# Patient Record
Sex: Male | Born: 1960 | Race: White | Hispanic: No | Marital: Married | Smoking: Current every day smoker
Health system: Southern US, Community
[De-identification: ages and names within clinical notes are randomized; demographics above are authoritative.]

## PROBLEM LIST (undated history)

## (undated) DIAGNOSIS — K219 Gastro-esophageal reflux disease without esophagitis: Secondary | ICD-10-CM

## (undated) DIAGNOSIS — J3089 Other allergic rhinitis: Secondary | ICD-10-CM

## (undated) DIAGNOSIS — K649 Unspecified hemorrhoids: Secondary | ICD-10-CM

## (undated) DIAGNOSIS — F319 Bipolar disorder, unspecified: Secondary | ICD-10-CM

## (undated) DIAGNOSIS — K269 Duodenal ulcer, unspecified as acute or chronic, without hemorrhage or perforation: Secondary | ICD-10-CM

## (undated) DIAGNOSIS — F0390 Unspecified dementia without behavioral disturbance: Secondary | ICD-10-CM

## (undated) DIAGNOSIS — E785 Hyperlipidemia, unspecified: Secondary | ICD-10-CM

## (undated) DIAGNOSIS — J449 Chronic obstructive pulmonary disease, unspecified: Secondary | ICD-10-CM

## (undated) DIAGNOSIS — T542X1A Toxic effect of corrosive acids and acid-like substances, accidental (unintentional), initial encounter: Secondary | ICD-10-CM

## (undated) DIAGNOSIS — K5790 Diverticulosis of intestine, part unspecified, without perforation or abscess without bleeding: Secondary | ICD-10-CM

## (undated) DIAGNOSIS — K635 Polyp of colon: Secondary | ICD-10-CM

## (undated) DIAGNOSIS — D490 Neoplasm of unspecified behavior of digestive system: Secondary | ICD-10-CM

## (undated) DIAGNOSIS — T304 Corrosion of unspecified body region, unspecified degree: Secondary | ICD-10-CM

## (undated) DIAGNOSIS — B019 Varicella without complication: Secondary | ICD-10-CM

## (undated) HISTORY — PX: SMALL INTESTINE SURGERY: SHX150

## (undated) HISTORY — DX: Hyperlipidemia, unspecified: E78.5

## (undated) HISTORY — PX: WRIST FUSION: SHX839

## (undated) HISTORY — PX: SINUS EXPLORATION: SHX5214

## (undated) HISTORY — PX: NASAL SINUS SURGERY: SHX719

## (undated) HISTORY — DX: Polyp of colon: K63.5

## (undated) HISTORY — PX: SKIN GRAFT: SHX250

## (undated) HISTORY — PX: TONSILLECTOMY: SUR1361

## (undated) HISTORY — PX: APPENDECTOMY: SHX54

---

## 2004-12-08 ENCOUNTER — Emergency Department (HOSPITAL_COMMUNITY): Admission: EM | Admit: 2004-12-08 | Discharge: 2004-12-08 | Payer: Self-pay | Admitting: Emergency Medicine

## 2005-03-05 ENCOUNTER — Emergency Department: Payer: Self-pay | Admitting: Emergency Medicine

## 2005-08-29 ENCOUNTER — Ambulatory Visit: Payer: Self-pay | Admitting: Family Medicine

## 2005-10-22 ENCOUNTER — Emergency Department: Payer: Self-pay | Admitting: Emergency Medicine

## 2005-10-22 ENCOUNTER — Other Ambulatory Visit: Payer: Self-pay

## 2005-10-23 ENCOUNTER — Ambulatory Visit: Payer: Self-pay | Admitting: Emergency Medicine

## 2006-06-12 ENCOUNTER — Other Ambulatory Visit: Payer: Self-pay

## 2006-06-12 ENCOUNTER — Inpatient Hospital Stay: Payer: Self-pay | Admitting: Internal Medicine

## 2006-11-20 ENCOUNTER — Emergency Department: Payer: Self-pay | Admitting: Emergency Medicine

## 2006-12-28 ENCOUNTER — Ambulatory Visit: Payer: Self-pay | Admitting: Gastroenterology

## 2007-02-01 ENCOUNTER — Ambulatory Visit: Payer: Self-pay | Admitting: Gastroenterology

## 2007-06-10 ENCOUNTER — Emergency Department: Payer: Self-pay | Admitting: Emergency Medicine

## 2007-06-11 ENCOUNTER — Emergency Department: Payer: Self-pay | Admitting: Emergency Medicine

## 2007-09-28 ENCOUNTER — Emergency Department: Payer: Self-pay | Admitting: Emergency Medicine

## 2008-03-05 ENCOUNTER — Emergency Department: Payer: Self-pay | Admitting: Internal Medicine

## 2008-09-09 ENCOUNTER — Ambulatory Visit: Payer: Self-pay | Admitting: Gastroenterology

## 2009-06-25 ENCOUNTER — Emergency Department: Payer: Self-pay | Admitting: Emergency Medicine

## 2012-09-20 ENCOUNTER — Emergency Department: Payer: Self-pay | Admitting: Emergency Medicine

## 2013-05-04 ENCOUNTER — Emergency Department: Payer: Self-pay | Admitting: Emergency Medicine

## 2013-05-05 LAB — COMPREHENSIVE METABOLIC PANEL
Anion Gap: 3 — ABNORMAL LOW (ref 7–16)
BUN: 8 mg/dL (ref 7–18)
Co2: 30 mmol/L (ref 21–32)
EGFR (African American): >60
Osmolality: 269 (ref 275–301)
SGPT (ALT): 28 U/L (ref 12–78)

## 2013-05-05 LAB — URINALYSIS, COMPLETE
Bacteria: NONE SEEN
Glucose,UR: NEGATIVE mg/dL (ref 0–75)
Ph: 5 (ref 4.5–8.0)
Protein: NEGATIVE
RBC,UR: 17 /HPF (ref 0–5)
Squamous Epithelial: NONE SEEN
WBC UR: 1 /HPF (ref 0–5)

## 2013-05-05 LAB — CBC
HCT: 45.9 % (ref 40.0–52.0)
HGB: 16 g/dL (ref 13.0–18.0)
RDW: 13.3 % (ref 11.5–14.5)

## 2013-05-13 ENCOUNTER — Ambulatory Visit: Payer: Self-pay | Admitting: Gastroenterology

## 2013-05-23 ENCOUNTER — Ambulatory Visit: Payer: Self-pay | Admitting: Gastroenterology

## 2013-06-20 LAB — PATHOLOGY REPORT

## 2013-08-29 ENCOUNTER — Ambulatory Visit: Payer: Self-pay | Admitting: Gastroenterology

## 2013-09-02 LAB — PATHOLOGY REPORT

## 2013-09-26 ENCOUNTER — Ambulatory Visit: Payer: Self-pay | Admitting: Otolaryngology

## 2013-10-22 ENCOUNTER — Ambulatory Visit: Payer: Self-pay | Admitting: Otolaryngology

## 2013-10-30 ENCOUNTER — Ambulatory Visit: Payer: Self-pay | Admitting: Otolaryngology

## 2013-10-31 LAB — PATHOLOGY REPORT

## 2013-11-16 ENCOUNTER — Emergency Department: Payer: Self-pay | Admitting: Emergency Medicine

## 2014-10-22 ENCOUNTER — Other Ambulatory Visit: Payer: Medicare Other

## 2014-10-26 ENCOUNTER — Ambulatory Visit: Payer: Medicare Other | Admitting: Neurology

## 2014-11-07 NOTE — Op Note (Signed)
PATIENT NAME:  FINN, AMOS MR#:  [id number] DATE OF BIRTH:  09-Dec-1960  DATE OF PROCEDURE:  10/30/2013  PREOPERATIVE DIAGNOSES:  1. Chronic polypoid sinusitis.  2. 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  3. Uvular papilloma.   POSTOPERATIVE DIAGNOSES:  1. Chronic polypoid sinusitis.  2. 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  3. Uvular papilloma.   PROCEDURES:  1. Image guided sinus surgery (Stryker navigation).  2. Bilateral frontal sinusotomies with tissue removal.  3. Bilateral anterior and posterior ethmoidectomies with tissue removal.  4. Bilateral maxillary sinus antrostomies with tissue removal.  5. Septoplasty.  6. Bilateral inferior turbinate submucous resections.  7. Removal of uvular papilloma.   SURGEON:  Janalee Dane, MD  ANESTHESIA:  General endotracheal  DESCRIPTION OF PROCEDURE:  The patient was identified in the holding area and was brought back to the operating room in the supine position on the operating room table.  After general endotracheal anesthesia had been induced the patient was turned 90 degrees counter clockwise from anesthesia.  The nose was anesthetized with infraorbital nerve blocks and septal injection with 0.5% Lidocaine and 0.25% Bupivacaine mixed with 1:150,000 with Epinephrine and phenylephrine Lidocaine soaked pledgets, two on each side were placed and the face was prepped and draped in the usual fashion.  The image-guided sinus surgery system mask was attached and registration was carried out in the standard fashion using the appropriate fiduciary points. Calibration of the system was confirmed and extensive review of the CT scan in all three dimensions preoperatively and intraoperatively was carried out.  Each instrument was registered and confirmed for anatomic accuracy.  The pledgets were removed.  A 15 blade was used  to make a left-sided hemitransfixion incision and septal mucoperichondrial mucoperiosteal leaflets elevated.  There was a large inferior spur that was resected with Jansen-Middleton forceps.  The remaining septum was deviated back and forth in an accordion like fashion.  The bony cartilaginous junction was then divided and a moderate amount of vomer and perpendicular plate was taken down with Jansen-Middleton forceps, releasing the tension on the remaining septum.  The septum then swung back into the midline.  The septal leaflets were closed with quilting 4-0 chromic suture.  The left sided hemitransfixion incision was closed with 4-0 plain gut.  Attention was directed to the turbinates which had been previously injected on the left.  The head of the inferior turbinate on the left was incised with a 15 blade and the medial mucoperiosteum was elevated using a Psychologist, educational.  Once this had been elevated Knight scissors were used to resect the conchal bone and lateral mucoperiosteum.  The inferior margin of the remaining mucoperiosteum was then cauterized with suction cautery and Surgiflo was placed at the inferior to the inferior margin of the remaining inferior turbinate.  An identical procedure was performed on the right inferior turbinate with once again placement of Surgiflo along its inferior margin.    Attention was directed to the sinus surgery portion of the surgery. The left side was addressed first. The middle turbinate was gently medialized with s Valora Corporal.  The uncinate process was then identified and completely resected revealing the natural maxillary sinus ostium. The natural maxillary sinus ostium was progressively enlarged by removing tissue to create a large maxillary antrostomy, removing diseased tissue. The maxillary antrum was then irrigated copiously with saline to remove all of the purulence. Attention was directed to the ethmoid sinuses where  bone and mucosal tissue from the ethmoid sinus was  taken down from anterior to posterior preserving the skull base and lamina papyracea, removing diseased tissue. After the ethmoid sinuses had been completely cleaned out of polypoid chronically inflamed mucosa preserving the mucosa on the medial side of the middle turbinate along the skull base and along the lamina papyracea. The 45 degree scope was then used to explore the frontal recess. The frontal recess was progressively enlarged meticulously, removing diseased tissue but leaving the polypoid tissue directly lining the duct, and the frontal sinus was then copiously irrigated with saline. Attention was then directed to the right side where an identical series of procedures was accomplished. Upon completion of the sinus surgery, the Surgiflo was placed. A total of one unit of Surgiflo was used.  Temporary Telfa pledgets were placed and tied over the columella to prevent dislodging.    The Dingman mouth retractor was placed. The uvula was lightly graft with DeBakeys and the papilloma was removed with surgical scissors. After placement of local anesthesia, the wound was lightly cauterized with electrocautery and the oropharynx was irrigated. The patient was then returned to anesthesia, allowed to emerge from anesthesia in the operating room, and taken to the recovery room in stable condition. There were no complications.   ESTIMATED BLOOD LOSS: 20 mL.  ____________________________ J. Nadeen Landau, MD jmc:dd D: 10/30/2013 22:50:00 ET T: 10/31/2013 04:09:39 ET JOB#: [id number]  cc: Janalee Dane, MD, <Dictator> Nicholos Johns MD ELECTRONICALLY SIGNED 11/09/2013 11:11

## 2014-12-03 ENCOUNTER — Emergency Department: Payer: Medicare Other

## 2014-12-03 ENCOUNTER — Emergency Department
Admission: EM | Admit: 2014-12-03 | Discharge: 2014-12-03 | Disposition: A | Discharge status: Home / Self Care | Payer: Medicare Other | Attending: Emergency Medicine | Admitting: Emergency Medicine

## 2014-12-03 ENCOUNTER — Encounter: Payer: Self-pay | Admitting: Emergency Medicine

## 2014-12-03 DIAGNOSIS — Z87891 Personal history of nicotine dependence: Secondary | ICD-10-CM | POA: Insufficient documentation

## 2014-12-03 DIAGNOSIS — K2970 Gastritis, unspecified, without bleeding: Secondary | ICD-10-CM | POA: Diagnosis not present

## 2014-12-03 DIAGNOSIS — Z79899 Other long term (current) drug therapy: Secondary | ICD-10-CM | POA: Insufficient documentation

## 2014-12-03 DIAGNOSIS — R110 Nausea: Secondary | ICD-10-CM

## 2014-12-03 DIAGNOSIS — R079 Chest pain, unspecified: Secondary | ICD-10-CM | POA: Diagnosis not present

## 2014-12-03 DIAGNOSIS — R1010 Upper abdominal pain, unspecified: Secondary | ICD-10-CM | POA: Diagnosis present

## 2014-12-03 HISTORY — DX: Diverticulosis of intestine, part unspecified, without perforation or abscess without bleeding: K57.90

## 2014-12-03 HISTORY — DX: Chronic obstructive pulmonary disease, unspecified: J44.9

## 2014-12-03 HISTORY — DX: Duodenal ulcer, unspecified as acute or chronic, without hemorrhage or perforation: K26.9

## 2014-12-03 LAB — COMPREHENSIVE METABOLIC PANEL
ALBUMIN: 4.6 g/dL (ref 3.5–5.0)
ALT: 18 U/L (ref 17–63)
ANION GAP: 11 (ref 5–15)
AST: 23 U/L (ref 15–41)
Alkaline Phosphatase: 91 U/L (ref 38–126)
BILIRUBIN TOTAL: 0.8 mg/dL (ref 0.3–1.2)
BUN: 10 mg/dL (ref 6–20)
CALCIUM: 9.7 mg/dL (ref 8.9–10.3)
CO2: 23 mmol/L (ref 22–32)
CREATININE: 0.95 mg/dL (ref 0.61–1.24)
Chloride: 104 mmol/L (ref 101–111)
GFR calc Af Amer: >60 mL/min (ref >60)
Glucose, Bld: 104 mg/dL — ABNORMAL HIGH (ref 65–99)
Potassium: 4 mmol/L (ref 3.5–5.1)
Sodium: 138 mmol/L (ref 135–145)
Total Protein: 8.1 g/dL (ref 6.5–8.1)

## 2014-12-03 LAB — URINALYSIS COMPLETE WITH MICROSCOPIC (ARMC ONLY)
BACTERIA UA: NONE SEEN
Bilirubin Urine: NEGATIVE
Glucose, UA: NEGATIVE mg/dL
Nitrite: NEGATIVE
PH: 5 (ref 5.0–8.0)
Protein, ur: 30 mg/dL — AB
Specific Gravity, Urine: 1.024 (ref 1.005–1.030)

## 2014-12-03 LAB — CBC WITH DIFFERENTIAL/PLATELET
BASOS ABS: 0.1 10*3/uL (ref 0–0.1)
BASOS PCT: 1 %
EOS ABS: 0.2 10*3/uL (ref 0–0.7)
EOS PCT: 2 %
HCT: 45.3 % (ref 40.0–52.0)
Hemoglobin: 15.8 g/dL (ref 13.0–18.0)
Lymphocytes Relative: 17 %
Lymphs Abs: 2 10*3/uL (ref 1.0–3.6)
MCH: 32.6 pg (ref 26.0–34.0)
MCHC: 34.9 g/dL (ref 32.0–36.0)
MCV: 93.5 fL (ref 80.0–100.0)
Monocytes Absolute: 0.9 10*3/uL (ref 0.2–1.0)
Monocytes Relative: 8 %
NEUTROS PCT: 72 %
Neutro Abs: 8.5 10*3/uL — ABNORMAL HIGH (ref 1.4–6.5)
PLATELETS: 300 10*3/uL (ref 150–440)
RBC: 4.84 MIL/uL (ref 4.40–5.90)
RDW: 13.6 % (ref 11.5–14.5)
WBC: 11.6 10*3/uL — AB (ref 3.8–10.6)

## 2014-12-03 LAB — TROPONIN I: Troponin I: <0.03 ng/mL (ref <0.031)

## 2014-12-03 LAB — LIPASE, BLOOD: LIPASE: 62 U/L — AB (ref 22–51)

## 2014-12-03 MED ORDER — HYDROMORPHONE HCL 1 MG/ML IJ SOLN
1.0000 mg | Freq: Once | INTRAMUSCULAR | Status: AC
  Administered 2014-12-03: 1 mg via INTRAVENOUS

## 2014-12-03 MED ORDER — ONDANSETRON HCL 4 MG/2ML IJ SOLN
INTRAMUSCULAR | Status: AC
  Administered 2014-12-03: 4 mg via INTRAVENOUS
  Filled 2014-12-03: qty 2

## 2014-12-03 MED ORDER — HYDROMORPHONE HCL 1 MG/ML IJ SOLN
INTRAMUSCULAR | Status: AC
  Administered 2014-12-03: 1 mg via INTRAVENOUS
  Filled 2014-12-03: qty 1

## 2014-12-03 MED ORDER — MORPHINE SULFATE 4 MG/ML IJ SOLN
INTRAMUSCULAR | Status: AC
  Administered 2014-12-03: 4 mg via INTRAVENOUS
  Filled 2014-12-03: qty 1

## 2014-12-03 MED ORDER — ONDANSETRON HCL 4 MG/2ML IJ SOLN
4.0000 mg | Freq: Once | INTRAMUSCULAR | Status: AC
  Administered 2014-12-03: 4 mg via INTRAVENOUS

## 2014-12-03 MED ORDER — DIAZEPAM 5 MG/ML IJ SOLN
INTRAMUSCULAR | Status: AC
  Administered 2014-12-03: 2.5 mg via INTRAVENOUS
  Filled 2014-12-03: qty 2

## 2014-12-03 MED ORDER — FAMOTIDINE IN NACL 20-0.9 MG/50ML-% IV SOLN
INTRAVENOUS | Status: AC
  Administered 2014-12-03: 20 mg via INTRAVENOUS
  Filled 2014-12-03: qty 50

## 2014-12-03 MED ORDER — ONDANSETRON HCL 4 MG PO TABS: 4.0000 mg | ORAL_TABLET | Freq: Three times a day (TID) | ORAL | Status: DC | PRN

## 2014-12-03 MED ORDER — DICYCLOMINE HCL 20 MG PO TABS: 20.0000 mg | ORAL_TABLET | Freq: Four times a day (QID) | ORAL | Status: DC | PRN

## 2014-12-03 MED ORDER — FAMOTIDINE 20 MG PO TABS: 20.0000 mg | ORAL_TABLET | Freq: Two times a day (BID) | ORAL | Status: DC

## 2014-12-03 MED ORDER — FAMOTIDINE IN NACL 20-0.9 MG/50ML-% IV SOLN
20.0000 mg | Freq: Once | INTRAVENOUS | Status: AC
  Administered 2014-12-03: 20 mg via INTRAVENOUS

## 2014-12-03 MED ORDER — DIAZEPAM 5 MG/ML IJ SOLN
2.5000 mg | Freq: Once | INTRAMUSCULAR | Status: AC
  Administered 2014-12-03: 2.5 mg via INTRAVENOUS

## 2014-12-03 MED ORDER — MORPHINE SULFATE 4 MG/ML IJ SOLN
4.0000 mg | Freq: Once | INTRAMUSCULAR | Status: AC
  Administered 2014-12-03: 4 mg via INTRAVENOUS

## 2014-12-03 NOTE — Discharge Instructions (Signed)
1. Add famotidine 20 mg twice daily to your current medications. 2. Take medicines as needed for abdominal discomfort and nausea (Bentyl/Zofran #20). 3. Eat a bland diet daily. 4. Return to the ER for worsening symptoms, persistent vomiting, difficulty breathing or other concerns.  Abdominal Pain Many things can cause abdominal pain. Usually, abdominal pain is not caused by a disease and will improve without treatment. It can often be observed and treated at home. Your health care provider will do a physical exam and possibly order blood tests and X-rays to help determine the seriousness of your pain. However, in many cases, more time must pass before a clear cause of the pain can be found. Before that point, your health care provider may not know if you need more testing or further treatment. HOME CARE INSTRUCTIONS  Monitor your abdominal pain for any changes. The following actions may help to alleviate any discomfort you are experiencing:  Only take over-the-counter or prescription medicines as directed by your health care provider.  Do not take laxatives unless directed to do so by your health care provider.  Try a clear liquid diet (broth, tea, or water) as directed by your health care provider. Slowly move to a bland diet as tolerated. SEEK MEDICAL CARE IF:  You have unexplained abdominal pain.  You have abdominal pain associated with nausea or diarrhea.  You have pain when you urinate or have a bowel movement.  You experience abdominal pain that wakes you in the night.  You have abdominal pain that is worsened or improved by eating food.  You have abdominal pain that is worsened with eating fatty foods.  You have a fever. SEEK IMMEDIATE MEDICAL CARE IF:   Your pain does not go away within 2 hours.  You keep throwing up (vomiting).  Your pain is felt only in portions of the abdomen, such as the right side or the left lower portion of the abdomen.  You pass bloody or black  tarry stools. MAKE SURE YOU:  Understand these instructions.   Will watch your condition.   Will get help right away if you are not doing well or get worse.  Document Released: 04/12/2005 Document Revised: 07/08/2013 Document Reviewed: 03/12/2013 [facility] Patient Information 2015 [place], [place]. This information is not intended to replace advice given to you by your health care provider. Make sure you discuss any questions you have with your health care provider.  Gastritis, Adult Gastritis is soreness and swelling (inflammation) of the lining of the stomach. Gastritis can develop as a sudden onset (acute) or long-term (chronic) condition. If gastritis is not treated, it can lead to stomach bleeding and ulcers. CAUSES  Gastritis occurs when the stomach lining is weak or damaged. Digestive juices from the stomach then inflame the weakened stomach lining. The stomach lining may be weak or damaged due to viral or bacterial infections. One common bacterial infection is the Helicobacter pylori infection. Gastritis can also result from excessive alcohol consumption, taking certain medicines, or having too much acid in the stomach.  SYMPTOMS  In some cases, there are no symptoms. When symptoms are present, they may include:  Pain or a burning sensation in the upper abdomen.  Nausea.  Vomiting.  An uncomfortable feeling of fullness after eating. DIAGNOSIS  Your caregiver may suspect you have gastritis based on your symptoms and a physical exam. To determine the cause of your gastritis, your caregiver may perform the following:  Blood or stool tests to check for the H pylori  bacterium.  Gastroscopy. A thin, flexible tube (endoscope) is passed down the esophagus and into the stomach. The endoscope has a light and camera on the end. Your caregiver uses the endoscope to view the inside of the stomach.  Taking a tissue sample (biopsy) from the stomach to examine under a microscope. TREATMENT    Depending on the cause of your gastritis, medicines may be prescribed. If you have a bacterial infection, such as an H pylori infection, antibiotics may be given. If your gastritis is caused by too much acid in the stomach, H2 blockers or antacids may be given. Your caregiver may recommend that you stop taking aspirin, ibuprofen, or other nonsteroidal anti-inflammatory drugs (NSAIDs). HOME CARE INSTRUCTIONS  Only take over-the-counter or prescription medicines as directed by your caregiver.  If you were given antibiotic medicines, take them as directed. Finish them even if you start to feel better.  Drink enough fluids to keep your urine clear or pale yellow.  Avoid foods and drinks that make your symptoms worse, such as:  Caffeine or alcoholic drinks.  Chocolate.  Peppermint or mint flavorings.  Garlic and onions.  Spicy foods.  Citrus fruits, such as oranges, lemons, or limes.  Tomato-based foods such as sauce, chili, salsa, and pizza.  Fried and fatty foods.  Eat small, frequent meals instead of large meals. SEEK IMMEDIATE MEDICAL CARE IF:   You have black or dark red stools.  You vomit blood or material that looks like coffee grounds.  You are unable to keep fluids down.  Your abdominal pain gets worse.  You have a fever.  You do not feel better after 1 week.  You have any other questions or concerns. MAKE SURE YOU:  Understand these instructions.  Will watch your condition.  Will get help right away if you are not doing well or get worse. Document Released: 06/27/2001 Document Revised: 01/02/2012 Document Reviewed: 08/16/2011 [facility] Patient Information 2015 [employer], [place]. This information is not intended to replace advice given to you by your health care provider. Make sure you discuss any questions you have with your health care provider.

## 2014-12-03 NOTE — ED Notes (Signed)

## 2014-12-03 NOTE — ED Notes (Signed)
Patient ambulatory to triage with steady gait, without difficulty or distress noted; pt reports having  x3 days having mid abd pain, described as "burning and stabbing" radiating into midsternum accomp by nausea

## 2014-12-03 NOTE — ED Provider Notes (Signed)
[facility] Emergency Department Provider Note  ____________________________________________  Time seen: Approximately 0350 AM  I have reviewed the triage vital signs and the nursing notes.   HISTORY  Chief Complaint Abdominal Pain    HPI Ian Newman. is a 54 y.o. male who presents with a three-day history of upper abdominal pain. Patient describes 10/10 burning and stabbing pain to epigastrium and upper quadrants radiating into his chest. States pain has been constant and progressive; patient has been unable to rest. Patient endorses nausea. Denies fever, chills, shortness of breath, vomiting, diarrhea, dysuria, testicular pain, numbness, tingling, weakness. States he has been taking his Carafate and acid reducers without relief. Nothing makes the pain better or worse.Patient admits he went on a "drastic" change in diet approximately 3 weeks ago, cutting out all fats, "peanut butter and mayonnaise", bacon in an effort to reduce his triglycerides.   Past Medical History  Diagnosis Date  . Diverticulosis   . Duodenal ulcer   . Bipolar affective   . COPD (chronic obstructive pulmonary disease)     There are no active problems to display for this patient.   Past Surgical History  Procedure Laterality Date  . Skin graft    . Wrist fusion      right  . Small intestine surgery      tumor removed  . Nasal sinus surgery      Current Outpatient Rx  Name  Route  Sig  Dispense  Refill  . dicyclomine (BENTYL) 20 MG tablet   Oral   Take 1 tablet (20 mg total) by mouth every 6 (six) hours as needed.   20 tablet   0   . famotidine (PEPCID) 20 MG tablet   Oral   Take 1 tablet (20 mg total) by mouth 2 (two) times daily.   60 tablet   0   . ondansetron (ZOFRAN) 4 MG tablet   Oral   Take 1 tablet (4 mg total) by mouth every 8 (eight) hours as needed for nausea or vomiting.   20 tablet   1     Allergies Review of patient's allergies indicates no  known allergies.  No family history on file.  Social History History  Substance Use Topics  . Smoking status: Former Research scientist (life sciences)  . Smokeless tobacco: Not on file  . Alcohol Use: Yes     Comment: occasional    Review of Systems Constitutional: No fever/chills Eyes: No visual changes. ENT: No sore throat. Cardiovascular: Positive for chest pain radiating from upper abdomen. Respiratory: Denies shortness of breath. Gastrointestinal: Positive for abdominal pain.  Positive for nausea. No vomiting.  No diarrhea.  No constipation. Genitourinary: Negative for dysuria. Musculoskeletal: Negative for back pain. Skin: Negative for rash. Neurological: Negative for headaches, focal weakness or numbness.  10-point ROS otherwise negative.  ____________________________________________   PHYSICAL EXAM:  VITAL SIGNS: ED Triage Vitals  Enc Vitals Group     BP 12/03/14 0307 216/128 mmHg     Pulse Rate 12/03/14 0307 88     Resp 12/03/14 0330 11     Temp 12/03/14 0307 97.9 F (36.6 C)     Temp Source 12/03/14 0307 Oral     SpO2 12/03/14 0307 100 %     Weight --      Height 12/03/14 0307 6' (1.829 m)     Head Cir --      Peak Flow --      Pain Score 12/03/14 0306 9  Pain Loc --      Pain Edu? --      Excl. in [place]? --     Constitutional: Alert and oriented. Well appearing and in moderate acute distress. Eyes: Conjunctivae are normal. PERRL. EOMI. Head: Atraumatic. Nose: No congestion/rhinnorhea. Mouth/Throat: Mucous membranes are moist.  Oropharynx non-erythematous. Neck: No stridor.   Cardiovascular: Normal rate, regular rhythm. Grossly normal heart sounds.  Good peripheral circulation. Respiratory: Normal respiratory effort.  No retractions. Lungs CTAB. Gastrointestinal: Soft, tender to palpation upper abdomen without rebound or guarding. No distention. No abdominal bruits. No CVA tenderness. Musculoskeletal: No lower extremity tenderness nor edema.  No joint  effusions. Neurologic:  Normal speech and language. No gross focal neurologic deficits are appreciated. Speech is normal. No gait instability. Skin:  Skin is warm, dry and intact. No rash noted. Psychiatric: Mood and affect are normal. Speech and behavior are normal.  ____________________________________________   LABS (all labs ordered are listed, but only abnormal results are displayed)  Labs Reviewed  CBC WITH DIFFERENTIAL/PLATELET - Abnormal; Notable for the following:    WBC 11.6 (*)    Neutro Abs 8.5 (*)    All other components within normal limits  COMPREHENSIVE METABOLIC PANEL - Abnormal; Notable for the following:    Glucose, Bld 104 (*)    All other components within normal limits  LIPASE, BLOOD - Abnormal; Notable for the following:    Lipase 62 (*)    All other components within normal limits  URINALYSIS COMPLETEWITH MICROSCOPIC ([facility])  - Abnormal; Notable for the following:    Color, Urine YELLOW (*)    APPearance CLEAR (*)    Ketones, ur 1+ (*)    Hgb urine dipstick 2+ (*)    Protein, ur 30 (*)    Leukocytes, UA 1+ (*)    Squamous Epithelial / LPF 0-5 (*)    All other components within normal limits  TROPONIN I   ____________________________________________  EKG  ED ECG REPORT   Date: 12/03/2014  EKG Time: 0317   Rate: 82  Rhythm: normal EKG, normal sinus rhythm  Axis: Normal  Intervals:none  ST&T Change: Nonspecific  ____________________________________________  RADIOLOGY  Portable chest x-ray (viewed by me, interpreted by Dr. Marisue Humble): No acute pulmonary process.  Ultrasound abdomen complete interpreted by Dr. Dorann Lodge: Nonvisualized pancreas and aorta, likely obscured by bowel gas.  No acute abdominal process by routine sonography. ____________________________________________   PROCEDURES  Procedure(s) performed: None  Critical Care performed: No  ____________________________________________   INITIAL IMPRESSION / ASSESSMENT AND  PLAN / ED COURSE  Pertinent labs & imaging results that were available during my care of the patient were reviewed by me and considered in my medical decision making (see chart for details).  53 year old male presenting with upper abdominal pain 3 days, history of peptic ulcer disease. IV fluid resuscitation, analgesia and antiemetic, proceed with abdominal ultrasound to evaluate biliary tree and also aorta. Will obtain upright chest x-ray to evaluate for free air.  ----------------------------------------- 5:30 AM on 12/03/2014 -----------------------------------------  Patient resting more comfortably. States initially had great relief after analgesic, pain now returning. Will try IV Valium. Patient asking for something to drink.  ----------------------------------------- 6:14 AM on 12/03/2014 -----------------------------------------  Patient improved. Will add famotidine in addition to patient's Carafate and Protonix. Strict return precautions given. Patient verbalizes understanding and agrees with plan of care. ____________________________________________   FINAL CLINICAL IMPRESSION(S) / ED DIAGNOSES  Final diagnoses:  Pain of upper abdomen  Nausea  Gastritis  Paulette Blanch, MD 12/04/14 [phone]

## 2014-12-03 NOTE — ED Notes (Signed)
Sung,MD consulted. MD made aware of persistent pain s/p Morphine. MD with [facility] for: Zofran 4mg  IV, Dilaudid 1mg  IV, Pepcid 20mg  IV and a one view chest. Orders to be entered and carried by this RN.

## 2014-12-03 NOTE — ED Notes (Signed)
Patient returned to the room from [place]. Pending results at this time. Pain at an acceptable level at this time. Will continue to monitor.

## 2014-12-07 DIAGNOSIS — K2100 Gastro-esophageal reflux disease with esophagitis, without bleeding: Secondary | ICD-10-CM | POA: Insufficient documentation

## 2014-12-15 ENCOUNTER — Encounter: Payer: Self-pay | Admitting: Cardiology

## 2014-12-15 ENCOUNTER — Encounter
Admission: RE | Disposition: A | Discharge status: Home / Self Care | Payer: Self-pay | Source: Ambulatory Visit | Attending: Gastroenterology

## 2014-12-15 ENCOUNTER — Ambulatory Visit
Admission: RE | Admit: 2014-12-15 | Discharge: 2014-12-15 | Disposition: A | Discharge status: Home / Self Care | Payer: Medicare Other | Source: Ambulatory Visit | Attending: Gastroenterology | Admitting: Gastroenterology

## 2014-12-15 ENCOUNTER — Ambulatory Visit: Payer: Medicare Other | Admitting: Anesthesiology

## 2014-12-15 ENCOUNTER — Other Ambulatory Visit: Payer: Self-pay | Admitting: Cardiology

## 2014-12-15 ENCOUNTER — Encounter: Payer: Self-pay | Admitting: Anesthesiology

## 2014-12-15 DIAGNOSIS — Z79899 Other long term (current) drug therapy: Secondary | ICD-10-CM | POA: Diagnosis not present

## 2014-12-15 DIAGNOSIS — R569 Unspecified convulsions: Secondary | ICD-10-CM | POA: Insufficient documentation

## 2014-12-15 DIAGNOSIS — J449 Chronic obstructive pulmonary disease, unspecified: Secondary | ICD-10-CM | POA: Insufficient documentation

## 2014-12-15 DIAGNOSIS — R1013 Epigastric pain: Secondary | ICD-10-CM | POA: Diagnosis present

## 2014-12-15 DIAGNOSIS — Z538 Procedure and treatment not carried out for other reasons: Secondary | ICD-10-CM | POA: Diagnosis not present

## 2014-12-15 DIAGNOSIS — R1011 Right upper quadrant pain: Secondary | ICD-10-CM | POA: Diagnosis not present

## 2014-12-15 DIAGNOSIS — Z888 Allergy status to other drugs, medicaments and biological substances status: Secondary | ICD-10-CM | POA: Diagnosis not present

## 2014-12-15 DIAGNOSIS — F319 Bipolar disorder, unspecified: Secondary | ICD-10-CM | POA: Diagnosis not present

## 2014-12-15 HISTORY — PX: ESOPHAGOGASTRODUODENOSCOPY: SHX5428

## 2014-12-15 SURGERY — EGD (ESOPHAGOGASTRODUODENOSCOPY)
Anesthesia: General

## 2014-12-15 MED ORDER — GLYCOPYRROLATE 0.2 MG/ML IJ SOLN: INTRAMUSCULAR | Status: DC | PRN

## 2014-12-15 MED ORDER — SODIUM CHLORIDE 0.9 % IV SOLN
INTRAVENOUS | Status: DC
  Administered 2014-12-15: 13:00:00 via INTRAVENOUS
  Administered 2014-12-15: 1000 mL via INTRAVENOUS

## 2014-12-15 MED ORDER — SODIUM CHLORIDE 0.9 % IV SOLN: INTRAVENOUS | Status: DC

## 2014-12-15 MED ORDER — FENTANYL CITRATE (PF) 100 MCG/2ML IJ SOLN: 25.0000 ug | INTRAMUSCULAR | Status: DC | PRN

## 2014-12-15 MED ORDER — PROPOFOL 10 MG/ML IV BOLUS
INTRAVENOUS | Status: DC | PRN
  Administered 2014-12-15: 70 mg via INTRAVENOUS

## 2014-12-15 MED ORDER — ONDANSETRON HCL 4 MG/2ML IJ SOLN: 4.0000 mg | Freq: Once | INTRAMUSCULAR | Status: DC | PRN

## 2014-12-15 NOTE — Transfer of Care (Signed)
Immediate Anesthesia Transfer of Care Note  Patient: Ian Newman.  Procedure(s) Performed: Procedure(s): ESOPHAGOGASTRODUODENOSCOPY (EGD) (N/A)  Patient Location: PACU and Endoscopy Unit  Anesthesia Type:General  Level of Consciousness: awake, alert  and oriented  Airway & Oxygen Therapy: Patient Spontanous Breathing and Patient connected to nasal cannula oxygen  Post-op Assessment: Report given to RN and Post -op Vital signs reviewed and stable  Post vital signs: Reviewed and stable  Last Vitals:  Filed Vitals:   12/15/14 1246  BP: 135/81  Pulse: 76  Temp: 36.4 C  Resp: 16    Complications: No apparent anesthesia complications

## 2014-12-15 NOTE — Progress Notes (Signed)
[place]    Cardiology Consultation Note  Patient ID: Ian Newman., MRN: [id number], DOB/AGE: [id number] y.o. Admit date: (Not on file)   Date of Consult: 12/15/2014 Primary Physician: Volanda Napoleon, MD Primary Cardiologist:  None  Chief Complaint:  No complaints Reason for Consult:  Abnormal EKG on telemetry during induction of anesthesia  HPI: 54 y.o. male with h/o  Patient is a 54 year old male with no prior cardiac history who was being evaluated with an upper endoscopy for right upper quadrant abdominal pain which is fairly constant.  He has no exertional component to this.  Is worse with deep palpation.  He denies chest pain otherwise orthopnea PND.  EKG 12 lead done after returning to holding area showed normal sinus rhythm with no ischemia.  EKG on telemetry during induction showed a apparent QRS with some ST changes.  This resolved after discontinuing probable fall.  Patient is currently hemodynamics is stable as no chest pain or shortness of breath.  Past Medical History  Diagnosis Date  . Diverticulosis   . Duodenal ulcer   . Bipolar affective   . COPD (chronic obstructive pulmonary disease)       Most Recent Cardiac Studies:   EKG:  Sinus rhythm with no ischemia   Surgical History:  Past Surgical History  Procedure Laterality Date  . Skin graft    . Wrist fusion      right  . Small intestine surgery      tumor removed  . Nasal sinus surgery       Home Meds: Prior to Admission medications   Medication Sig Start Date End Date Taking? Authorizing Provider  atorvastatin (LIPITOR) 10 MG tablet Take 1 tablet by mouth daily. 11/02/14   Historical Provider, MD  dicyclomine (BENTYL) 20 MG tablet Take 1 tablet (20 mg total) by mouth every 6 (six) hours as needed. Patient taking differently: Take 20 mg by mouth every 6 (six) hours as needed for spasms.  12/03/14   Paulette Blanch, MD  famotidine (PEPCID) 20 MG tablet  Take 1 tablet (20 mg total) by mouth 2 (two) times daily. 12/03/14   Paulette Blanch, MD  fluticasone (FLONASE) 50 MCG/ACT nasal spray Place 1 spray into both nostrils daily. 09/03/14   Historical Provider, MD  ondansetron (ZOFRAN) 4 MG tablet Take 1 tablet (4 mg total) by mouth every 8 (eight) hours as needed for nausea or vomiting. 12/03/14   Paulette Blanch, MD  pantoprazole (PROTONIX) 40 MG tablet Take 1 tablet by mouth daily. 06/10/14   Historical Provider, MD  sucralfate (CARAFATE) 1 G tablet Take 1 tablet by mouth 2 (two) times daily.    Historical Provider, MD    Inpatient Medications:     Allergies:  Allergies  Allergen Reactions  . Pepcid [Famotidine] Itching    History   Social History  . Marital Status: Married    Spouse Name: N/A  . Number of Children: N/A  . Years of Education: N/A   Occupational History  . Not on file.   Social History Main Topics  . Smoking status: Former Research scientist (life sciences)  . Smokeless tobacco: Not on file  . Alcohol Use: Yes     Comment: occasional  . Drug Use: Not on file  . Sexual Activity: Not on file   Other Topics Concern  . Not on file   Social History Narrative     No family history on  file.   Review of Systems: General: negative for chills, fever, night sweats or weight changes.  Cardiovascular: negative for chest pain, edema, orthopnea, palpitations, paroxysmal nocturnal dyspnea, shortness of breath or dyspnea on exertion Dermatological: negative for rash Respiratory: negative for cough or wheezing Urologic: negative for hematuria Abdominal: negative for nausea, vomiting, diarrhea, bright red blood per rectum, melena, or hematemesis right upper quadrant abdominal pain Neurologic: negative for visual changes, syncope, or dizziness All other systems reviewed and are otherwise negative except as noted above.  Labs: No results for input(s): CKTOTAL, CKMB, TROPONINI in the last 72 hours. Lab Results  Component Value Date   WBC 11.6* 12/03/2014    HGB 15.8 12/03/2014   HCT 45.3 12/03/2014   MCV 93.5 12/03/2014   PLT 300 12/03/2014   No results for input(s): NA, K, CL, CO2, BUN, CREATININE, CALCIUM, PROT, BILITOT, ALKPHOS, ALT, AST, GLUCOSE in the last 168 hours.  Invalid input(s): LABALBU No results found for: CHOL, HDL, LDLCALC, TRIG No results found for: DDIMER  Radiology/Studies:  US Abdomen Complete  12/03/2014   CLINICAL DATA:  RIGHT upper quadrant pain for 3-4 days, assess aorta. History of diverticulosis.  EXAM: ULTRASOUND ABDOMEN COMPLETE  COMPARISON:  RIGHT upper quadrant ultrasound May 05, 2013 and HIDA scan May 23, 2013  FINDINGS: Gallbladder: No gallstones or wall thickening visualized. No sonographic Murphy sign noted.  Common bile duct: Diameter: 5 mm  Liver: No focal lesion identified. Within normal limits in parenchymal echogenicity. Hepatopetal portal vein.  IVC: No abnormality visualized.  Pancreas: Predominately obscured, likely by bowel gas.  Spleen: Size and appearance within normal limits.  Right Kidney: Length: 10.8 cm. Echogenicity within normal limits. No mass or hydronephrosis visualized.  Left Kidney: Length: 11.8 cm. Echogenicity within normal limits. No mass or hydronephrosis visualized.  Abdominal aorta: Predominately obscured, likely by bowel gas.  Other findings: None.  IMPRESSION: Nonvisualized pancreas and aorta, likely obscured by bowel gas.  No acute abdominal process by routine sonography.   Electronically Signed   By: Elon Alas   On: 12/03/2014 05:15   Dg Chest Portable 1 View  12/03/2014   CLINICAL DATA:  Chest pressure. Upper abdominal pain. Symptoms for 3 days.  EXAM: PORTABLE CHEST - 1 VIEW  COMPARISON:  05/05/2013  FINDINGS: The cardiomediastinal contours are normal. The lungs are clear. Pulmonary vasculature is normal. No consolidation, pleural effusion, or pneumothorax. No acute osseous abnormalities are seen.  IMPRESSION: No acute pulmonary process.   Electronically Signed   By:  Jeb Levering M.D.   On: 12/03/2014 04:31    EKG:  Normal sinus rhythm with no ischemia  Weights: There were no vitals filed for this visit.   Physical Exam: There were no vitals taken for this visit. There is no weight on file to calculate BMI. General: Well developed, well nourished, in no acute distress. Head: Normocephalic, atraumatic, sclera non-icteric, no xanthomas, nares are without discharge.  Neck: Negative for carotid bruits. JVD not elevated. Lungs: Clear bilaterally to auscultation without wheezes, rales, or rhonchi. Breathing is unlabored. Heart: RRR with S1 S2. No murmurs, rubs, or gallops appreciated. Abdomen: Soft, non-tender, non-distended with normoactive bowel sounds. No hepatomegaly. No rebound/guarding. No obvious abdominal masses. Msk:  Strength and tone appear normal for age. Extremities: No clubbing or cyanosis. No edema.  Distal pedal pulses are 2+ and equal bilaterally. Neuro: Alert and oriented X 3. No facial asymmetry. No focal deficit. Moves all extremities spontaneously. Psych:  Responds to questions appropriately with a normal  affect.    Assessment and Plan:  Patient with right upper quadrant abdominal pain with an abnormal transient rhythm strip on telemetry during indction  Of moderate sedation.  Procedure canceled.  Twelve lead EKG post conduction was normal.  To risk stratify patient prior to further attempts at upper endoscopy.  Would discharge home with follow-up with an outpatient this week for further recommendations.  Signed, Javier Docker Takia Runyon MD [facility]   12/15/2014, 3:30 PM

## 2014-12-15 NOTE — OR Nursing (Signed)
Patient procedure canceled due to cardiac arrhythmias, elevated ST segment, 12 lead EKG ordered, patient did receive some sedation and will go to GI recovery.

## 2014-12-15 NOTE — Anesthesia Preprocedure Evaluation (Signed)
Anesthesia Evaluation  Patient identified by MRN, date of birth, ID band Patient awake    Reviewed: Allergy & Precautions, NPO status , Patient's Chart, lab work & pertinent test results  Airway Mallampati: III  TM Distance: >3 FB Neck ROM: Full   Comment: Large neck Dental  (+) Chipped, Upper Dentures   Pulmonary COPD COPD inhaler, former smoker,  breath sounds clear to auscultation  Pulmonary exam normal       Cardiovascular Normal cardiovascular examRhythm:Regular Rate:Normal     Neuro/Psych Bipolar Disorder    GI/Hepatic Neg liver ROS, PUD, GERD-  Medicated and Controlled,Hx of Barrett's esophagus   Endo/Other  negative endocrine ROS  Renal/GU negative Renal ROS     Musculoskeletal negative musculoskeletal ROS (+)   Abdominal (+) + obese,   Peds  Hematology negative hematology ROS (+)   Anesthesia Other Findings   Reproductive/Obstetrics negative OB ROS                             Anesthesia Physical Anesthesia Plan  ASA: III  Anesthesia Plan: General   Post-op Pain Management:    Induction: Intravenous  Airway Management Planned: Nasal Cannula  Additional Equipment:   Intra-op Plan:   Post-operative Plan:   Informed Consent: I have reviewed the patients History and Physical, chart, labs and discussed the procedure including the risks, benefits and alternatives for the proposed anesthesia with the patient or authorized representative who has indicated his/her understanding and acceptance.   Dental advisory given  Plan Discussed with: CRNA and Surgeon  Anesthesia Plan Comments:         Anesthesia Quick Evaluation

## 2014-12-15 NOTE — H&P (Addendum)
Outpatient short stay form Pre-procedure 12/15/2014 1:20 PM Ian Sails MD  Primary Physician: Dr. Brunetta Genera  Reason for visit:  EGD  History of present illness:  Patient is a 54 year old male who presents today for an EGD in regards to recent increase of symptoms of right upper quadrant pain in the setting of possible history of erosive gastritis and duodenitis. In taking a proton pump inhibitor while some Carafate may be helping some he continues to have a right upper quadrant pain that is daily area is been no melanoma or hematochezia. He is some nausea. He has had a evaluation in the emergency room about with ultrasound and laboratories the showing a slight elevation of lipase at 62. Abdominal ultrasound was negative for gallstones biliary ductal dilatation or other changes.    Current facility-administered medications:  .  0.9 %  sodium chloride infusion, , Intravenous, Continuous, Ian Sails, MD, Last Rate: 10 mL/hr at 12/15/14 1258, 1,000 mL at 12/15/14 1258 .  0.9 %  sodium chloride infusion, , Intravenous, Continuous, Ian Sails, MD  Prescriptions prior to admission  Medication Sig Dispense Refill Last Dose  . dicyclomine (BENTYL) 20 MG tablet Take 1 tablet (20 mg total) by mouth every 6 (six) hours as needed. (Patient taking differently: Take 20 mg by mouth every 6 (six) hours as needed for spasms. ) 20 tablet 0 12/14/2014  . fluticasone (FLONASE) 50 MCG/ACT nasal spray Place 1 spray into both nostrils daily.   12/14/2014  . ondansetron (ZOFRAN) 4 MG tablet Take 1 tablet (4 mg total) by mouth every 8 (eight) hours as needed for nausea or vomiting. 20 tablet 1 12/14/2014  . pantoprazole (PROTONIX) 40 MG tablet Take 1 tablet by mouth daily.   12/14/2014  . sucralfate (CARAFATE) 1 G tablet Take 1 tablet by mouth 2 (two) times daily.   12/14/2014  . atorvastatin (LIPITOR) 10 MG tablet Take 1 tablet by mouth daily.   12/14/2014  . famotidine (PEPCID) 20 MG tablet Take 1  tablet (20 mg total) by mouth 2 (two) times daily. 60 tablet 0      Allergies  Allergen Reactions  . Pepcid [Famotidine] Itching     Past Medical History  Diagnosis Date  . Diverticulosis   . Duodenal ulcer   . Bipolar affective   . COPD (chronic obstructive pulmonary disease)     Review of systems:      Physical Exam    Heart and lungs: Regular rate and rhythm without rub or gallop the lungs are bilaterally clear    HEENT: Normocephalic atraumatic eyes are anicteric    Other:     Pertinant exam for procedure: Soft tender to palpation in the epigastric region extending toward the right upper quadrant. As follows along the lower edge of the costal margin. There are no masses or rebound. I'll sounds positive normoactive    Planned proceedures: EGD and indicated procedures. I have discussed the risks benefits and complications of procedures to include not limited to bleeding, infection, perforation and the risk of sedation and the patient wishes to proceed.    Ian Sails, MD Gastroenterology 12/15/2014  1:20 PM     Addendum: After positioning patient for his procedure to getting seizure noted that he develop an atypical configuration of his ekg apparent ST elevation. Scope is not introduced. The sedation was halted. The apparent ST changes resolved.  Consultation was obtained from Dr Ubaldo Glassing.  Plans for further evaluation as outpatient noted.

## 2014-12-15 NOTE — Op Note (Signed)
[facility] Gastroenterology Patient Name: Christoph Copelan Procedure Date: 12/15/2014 1:28 PM MRN: [id number] Account #: [id number] Date of Birth: Feb 13, 1961 Admit Type: Outpatient Age: 54 Room: [facility] ENDO ROOM 3 Gender: Male Note Status: Finalized Procedure:         Upper GI endoscopy Indications:       Epigastric abdominal pain, Abdominal pain in the right                     upper quadrant Providers:         Lollie Sails, MD Referring MD:      Venetia Maxon. Elijio Miles, MD (Referring MD) Medicines:         Monitored Anesthesia Care Complications:     No immediate complications. Procedure:         Pre-Anesthesia Assessment:                    - ASA Grade Assessment: II - A patient with mild systemic                     disease.                    After obtaining informed consent, the endoscope was passed                     under direct vision. Throughout the procedure, the                     patient's blood pressure, pulse, and oxygen saturations                     were monitored continuously. The procedure was aborted.                     The scope was not inserted. Medications were given. The                     procedure was aborted due to ECG changes on sedation. Findings:      aborted due to ECG changes on sedation Impression:        - The procedure was aborted due to ECG changes on sedation.                    - No specimens collected. Recommendation:    - Refer to a cardiologist today. Diagnosis Code(s): --- Professional ---                    789.06, Abdominal pain, epigastric                    789.01, Abdominal pain, right upper quadrant                    V64.3, Procedure not carried out for other reasons Lollie Sails, MD 12/15/2014 3:34:04 PM This report has been signed electronically. Number of Addenda: 0 Note Initiated On: 12/15/2014 1:28 PM      [facility]

## 2014-12-15 NOTE — Anesthesia Postprocedure Evaluation (Signed)
  Anesthesia Post-op Note  Patient: Ian Newman.  Procedure(s) Performed: Procedure(s): ESOPHAGOGASTRODUODENOSCOPY (EGD) (N/A)  Anesthesia type:General  Patient location: PACU  Post pain: Pain level controlled  Post assessment: Post-op Vital signs reviewed, Patient's Cardiovascular Status Stable, Respiratory Function Stable, Patent Airway and No signs of Nausea or vomiting  Post vital signs: Reviewed and stable  Last Vitals:  Filed Vitals:   12/15/14 1420  BP: 145/81  Pulse: 66  Temp:   Resp: 15    Level of consciousness: awake, alert  and patient cooperative  Complications: Patient had intraop ST segment changes right after propofol was given that was a possible elevation or widening of the complex in a 3 lead system , which resolved  Intra op. Nevertheless, Dr. Gustavo Lah wanted to cancel the case for evaluation of possible EKG changes, which I felt was reasonable. The   12 lead EKG was normal in the PACU and the 3lead system was changed to a 5 lead System with normal looking complex.  Dr. Ubaldo Glassing from cardiology is present to evaluate the patient.

## 2014-12-17 ENCOUNTER — Encounter: Payer: Self-pay | Admitting: Gastroenterology

## 2015-01-04 ENCOUNTER — Encounter: Payer: Self-pay | Admitting: *Deleted

## 2015-01-05 ENCOUNTER — Encounter: Admission: RE | Payer: Self-pay | Source: Ambulatory Visit

## 2015-01-05 ENCOUNTER — Ambulatory Visit: Admission: RE | Admit: 2015-01-05 | Payer: Self-pay | Source: Ambulatory Visit | Admitting: Gastroenterology

## 2015-01-05 ENCOUNTER — Ambulatory Visit
Admission: RE | Admit: 2015-01-05 | Discharge: 2015-01-05 | Disposition: A | Discharge status: Home / Self Care | Payer: Medicare Other | Source: Ambulatory Visit | Attending: Gastroenterology | Admitting: Gastroenterology

## 2015-01-05 ENCOUNTER — Encounter
Admission: RE | Disposition: A | Discharge status: Home / Self Care | Payer: Self-pay | Source: Ambulatory Visit | Attending: Gastroenterology

## 2015-01-05 ENCOUNTER — Ambulatory Visit: Payer: Medicare Other | Admitting: Anesthesiology

## 2015-01-05 DIAGNOSIS — Z888 Allergy status to other drugs, medicaments and biological substances status: Secondary | ICD-10-CM | POA: Diagnosis not present

## 2015-01-05 DIAGNOSIS — K319 Disease of stomach and duodenum, unspecified: Secondary | ICD-10-CM | POA: Insufficient documentation

## 2015-01-05 DIAGNOSIS — F319 Bipolar disorder, unspecified: Secondary | ICD-10-CM | POA: Diagnosis not present

## 2015-01-05 DIAGNOSIS — R1011 Right upper quadrant pain: Secondary | ICD-10-CM | POA: Diagnosis present

## 2015-01-05 DIAGNOSIS — J449 Chronic obstructive pulmonary disease, unspecified: Secondary | ICD-10-CM | POA: Diagnosis not present

## 2015-01-05 DIAGNOSIS — K219 Gastro-esophageal reflux disease without esophagitis: Secondary | ICD-10-CM | POA: Diagnosis not present

## 2015-01-05 DIAGNOSIS — Z79899 Other long term (current) drug therapy: Secondary | ICD-10-CM | POA: Diagnosis not present

## 2015-01-05 HISTORY — DX: Varicella without complication: B01.9

## 2015-01-05 HISTORY — DX: Unspecified hemorrhoids: K64.9

## 2015-01-05 HISTORY — PX: ESOPHAGOGASTRODUODENOSCOPY: SHX5428

## 2015-01-05 HISTORY — DX: Bipolar disorder, unspecified: F31.9

## 2015-01-05 HISTORY — DX: Gastro-esophageal reflux disease without esophagitis: K21.9

## 2015-01-05 SURGERY — EGD (ESOPHAGOGASTRODUODENOSCOPY)
Anesthesia: General

## 2015-01-05 MED ORDER — PROPOFOL 10 MG/ML IV BOLUS
INTRAVENOUS | Status: DC | PRN
  Administered 2015-01-05: 50 mg via INTRAVENOUS

## 2015-01-05 MED ORDER — SODIUM CHLORIDE 0.9 % IV SOLN
INTRAVENOUS | Status: DC
  Administered 2015-01-05: 1000 mL via INTRAVENOUS

## 2015-01-05 MED ORDER — MIDAZOLAM HCL 2 MG/2ML IJ SOLN
INTRAMUSCULAR | Status: DC | PRN
  Administered 2015-01-05: 1 mg via INTRAVENOUS

## 2015-01-05 MED ORDER — PROPOFOL INFUSION 10 MG/ML OPTIME
INTRAVENOUS | Status: DC | PRN
  Administered 2015-01-05: 100 ug/kg/min via INTRAVENOUS

## 2015-01-05 MED ORDER — GLYCOPYRROLATE 0.2 MG/ML IJ SOLN
INTRAMUSCULAR | Status: DC | PRN
  Administered 2015-01-05: 0.2 mg via INTRAVENOUS

## 2015-01-05 MED ORDER — SODIUM CHLORIDE 0.9 % IV SOLN: INTRAVENOUS | Status: DC

## 2015-01-05 MED ORDER — FENTANYL CITRATE (PF) 100 MCG/2ML IJ SOLN
INTRAMUSCULAR | Status: DC | PRN
  Administered 2015-01-05: 50 ug via INTRAVENOUS

## 2015-01-05 NOTE — Anesthesia Preprocedure Evaluation (Signed)
Anesthesia Evaluation  Patient identified by MRN, date of birth, ID band Patient awake    Reviewed: Allergy & Precautions, NPO status , Patient's Chart, lab work & pertinent test results  History of Anesthesia Complications Negative for: history of anesthetic complications  Airway Mallampati: II  TM Distance: >3 FB Neck ROM: Full    Dental  (+) Upper Dentures   Pulmonary COPDCurrent Smoker,  breath sounds clear to auscultation  Pulmonary exam normal       Cardiovascular Exercise Tolerance: Good negative cardio ROS Normal cardiovascular examRhythm:Regular Rate:Normal     Neuro/Psych Bipolar Disorder negative neurological ROS     GI/Hepatic Neg liver ROS, PUD, GERD-  Medicated and Poorly Controlled,  Endo/Other  negative endocrine ROS  Renal/GU negative Renal ROS  negative genitourinary   Musculoskeletal negative musculoskeletal ROS (+)   Abdominal   Peds negative pediatric ROS (+)  Hematology negative hematology ROS (+)   Anesthesia Other Findings   Reproductive/Obstetrics negative OB ROS                             Anesthesia Physical Anesthesia Plan  ASA: II  Anesthesia Plan: General   Post-op Pain Management:    Induction: Intravenous  Airway Management Planned: Nasal Cannula  Additional Equipment:   Intra-op Plan:   Post-operative Plan:   Informed Consent: I have reviewed the patients History and Physical, chart, labs and discussed the procedure including the risks, benefits and alternatives for the proposed anesthesia with the patient or authorized representative who has indicated his/her understanding and acceptance.     Plan Discussed with: CRNA and Surgeon  Anesthesia Plan Comments:         Anesthesia Quick Evaluation

## 2015-01-05 NOTE — Anesthesia Postprocedure Evaluation (Signed)
  Anesthesia Post-op Note  Patient: Ian Newman.  Procedure(s) Performed: Procedure(s): ESOPHAGOGASTRODUODENOSCOPY (EGD) (N/A)  Anesthesia type:General  Patient location: PACU  Post pain: Pain level controlled  Post assessment: Post-op Vital signs reviewed, Patient's Cardiovascular Status Stable, Respiratory Function Stable, Patent Airway and No signs of Nausea or vomiting  Post vital signs: Reviewed and stable  Last Vitals:  Filed Vitals:   01/05/15 1310  BP: 136/64  Pulse: 78  Temp: 36.8 C  Resp: 16    Level of consciousness: awake, alert  and patient cooperative  Complications: No apparent anesthesia complications

## 2015-01-05 NOTE — Op Note (Signed)
[facility] Gastroenterology Patient Name: Ian Newman Procedure Date: 01/05/2015 12:29 PM MRN: [id number] Account #: [id number] Date of Birth: 1961/01/16 Admit Type: Outpatient Age: 54 Room: [facility] ENDO ROOM 3 Gender: Male Note Status: Finalized Procedure:         Upper GI endoscopy Indications:       Abdominal pain in the right upper quadrant Providers:         Lollie Sails, MD Referring MD:      No Local Md, MD (Referring MD) Medicines:         Monitored Anesthesia Care Complications:     No immediate complications. Procedure:         Pre-Anesthesia Assessment:                    - ASA Grade Assessment: II - A patient with mild systemic                     disease.                    After obtaining informed consent, the endoscope was passed                     under direct vision. Throughout the procedure, the                     patient's blood pressure, pulse, and oxygen saturations                     were monitored continuously. The Olympus GIF-160 endoscope                     (S#. [id number]) was introduced through the mouth, and                     advanced to the third part of duodenum. The upper GI                     endoscopy was accomplished without difficulty. The patient                     tolerated the procedure well. Findings:      The Z-line was irregular.      LA Grade B (one or more mucosal breaks greater than 5 mm, not extending       between the tops of two mucosal folds) esophagitis with no bleeding was       found. Biopsies were taken with a cold forceps for histology.      Patchy mild inflammation characterized by congestion (edema), erosions       and granularity was found in the prepyloric region of the stomach.       Biopsies were taken with a cold forceps for histology. Biopsies were       taken with a cold forceps for Helicobacter pylori testing.      The cardia and gastric fundus were normal on retroflexion.      The examined  duodenum was normal. Impression:        - Z-line irregular.                    - LA Grade B erosive esophagitis. Biopsied.                    - Erosive gastritis. Biopsied.                    -  Normal examined duodenum. Recommendation:    - Use sucralfate tablets 1 gram PO QID for 1 month. Procedure Code(s): --- Professional ---                    [phone], Esophagogastroduodenoscopy, flexible, transoral;                     with biopsy, single or multiple Diagnosis Code(s): --- Professional ---                    530.89, Other specified disorders of esophagus                    530.19, Other esophagitis                    535.40, Other specified gastritis, without mention of                     hemorrhage                    789.01, Abdominal pain, right upper quadrant CPT copyright 2014 [employer]. All rights reserved. The codes documented in this report are preliminary and upon coder review may  be revised to meet current compliance requirements. Lollie Sails, MD 01/05/2015 12:55:59 PM This report has been signed electronically. Number of Addenda: 0 Note Initiated On: 01/05/2015 12:29 PM      [facility]

## 2015-01-05 NOTE — H&P (Signed)
Outpatient short stay form Pre-procedure 01/05/2015 12:32 PM Lollie Sails MD  Primary Physician: Dr Elijio Miles  Reason for visit:  EGD  History of present illness:  54 year old male presenting for EGD due to right upper quadrant pain. He continues to have this is an issue despite using a pump inhibitor. Her some question is whether he may have a biliary hypokinesia. Patient had been previously arrange for an EGD however some EKG changes were noted are to the procedure and he was sent for cardiac evaluation. This returned normal.    Current facility-administered medications:  .  0.9 %  sodium chloride infusion, , Intravenous, Continuous, Lollie Sails, MD, Last Rate: 50 mL/hr at 01/05/15 1022, 1,000 mL at 01/05/15 1022 .  0.9 %  sodium chloride infusion, , Intravenous, Continuous, Lollie Sails, MD  Prescriptions prior to admission  Medication Sig Dispense Refill Last Dose  . Multiple Vitamin (MULTIVITAMIN) tablet Take 1 tablet by mouth daily.     Marland Kitchen atorvastatin (LIPITOR) 10 MG tablet Take 1 tablet by mouth daily.   Completed Course at Unknown time  . dicyclomine (BENTYL) 20 MG tablet Take 1 tablet (20 mg total) by mouth every 6 (six) hours as needed. (Patient not taking: Reported on 01/04/2015) 20 tablet 0 Completed Course at Unknown time  . famotidine (PEPCID) 20 MG tablet Take 1 tablet (20 mg total) by mouth 2 (two) times daily. (Patient not taking: Reported on 01/04/2015) 60 tablet 0 Completed Course at Unknown time  . fluticasone (FLONASE) 50 MCG/ACT nasal spray Place 1 spray into both nostrils daily.   12/14/2014  . ondansetron (ZOFRAN) 4 MG tablet Take 1 tablet (4 mg total) by mouth every 8 (eight) hours as needed for nausea or vomiting. 20 tablet 1 12/14/2014  . pantoprazole (PROTONIX) 40 MG tablet Take 1 tablet by mouth daily.   12/14/2014  . sucralfate (CARAFATE) 1 G tablet Take 1 tablet by mouth 2 (two) times daily.   12/14/2014     Allergies  Allergen Reactions  .  Pepcid [Famotidine] Itching     Past Medical History  Diagnosis Date  . Diverticulosis   . Duodenal ulcer   . Bipolar affective   . COPD (chronic obstructive pulmonary disease)   . GERD (gastroesophageal reflux disease)   . Bipolar 1 disorder   . Chicken pox   . Hemorrhoid     Review of systems:      Physical Exam    Heart and lungs: Regular rate and rhythm without rub or gallop lungs are bilaterally clear    HEENT:    Other: Normocephalic atraumatic eyes are anicteric    Pertinant exam for procedure: Tender to palpation in the right upper quadrant. There are no masses rebound or organomegaly. Bowel sounds are positive normoactive. There is no abdominal distention.    Planned proceedures: EGD with indicated procedures I have discussed the risks benefits and complications of procedures to include not limited to bleeding, infection, perforation and the risk of sedation and the patient wishes to proceed.    Lollie Sails, MD Gastroenterology 01/05/2015  12:32 PM

## 2015-01-05 NOTE — Transfer of Care (Signed)
Immediate Anesthesia Transfer of Care Note  Patient: Ian Newman.  Procedure(s) Performed: Procedure(s): ESOPHAGOGASTRODUODENOSCOPY (EGD) (N/A)  Patient Location: PACU  Anesthesia Type:General  Level of Consciousness: awake and alert   Airway & Oxygen Therapy: Patient Spontanous Breathing and Patient connected to nasal cannula oxygen  Post-op Assessment: Report given to RN  Post vital signs: stable  Last Vitals:  Filed Vitals:   01/05/15 1310  BP: 136/64  Pulse: 78  Temp: 36.8 C  Resp: 16    Complications: No apparent anesthesia complications

## 2015-01-06 ENCOUNTER — Encounter: Payer: Self-pay | Admitting: Gastroenterology

## 2015-01-06 LAB — SURGICAL PATHOLOGY

## 2016-02-18 ENCOUNTER — Ambulatory Visit (INDEPENDENT_AMBULATORY_CARE_PROVIDER_SITE_OTHER): Payer: Medicare Other | Admitting: Family Medicine

## 2016-02-18 ENCOUNTER — Encounter: Payer: Self-pay | Admitting: Family Medicine

## 2016-02-18 VITALS — BP 168/122 | HR 74 | Temp 98.2°F | Ht 71.0 in | Wt 241.2 lb

## 2016-02-18 DIAGNOSIS — M1712 Unilateral primary osteoarthritis, left knee: Secondary | ICD-10-CM | POA: Diagnosis not present

## 2016-02-18 DIAGNOSIS — I10 Essential (primary) hypertension: Secondary | ICD-10-CM | POA: Insufficient documentation

## 2016-02-18 DIAGNOSIS — E669 Obesity, unspecified: Secondary | ICD-10-CM

## 2016-02-18 DIAGNOSIS — K2100 Gastro-esophageal reflux disease with esophagitis, without bleeding: Secondary | ICD-10-CM

## 2016-02-18 DIAGNOSIS — E66811 Obesity, class 1: Secondary | ICD-10-CM

## 2016-02-18 DIAGNOSIS — E785 Hyperlipidemia, unspecified: Secondary | ICD-10-CM

## 2016-02-18 DIAGNOSIS — R030 Elevated blood-pressure reading, without diagnosis of hypertension: Secondary | ICD-10-CM

## 2016-02-18 DIAGNOSIS — IMO0001 Reserved for inherently not codable concepts without codable children: Secondary | ICD-10-CM

## 2016-02-18 DIAGNOSIS — Z130 Encounter for screening for diseases of the blood and blood-forming organs and certain disorders involving the immune mechanism: Secondary | ICD-10-CM | POA: Diagnosis not present

## 2016-02-18 LAB — COMPREHENSIVE METABOLIC PANEL WITH GFR
ALT: 25 U/L (ref 0–53)
AST: 26 U/L (ref 0–37)
Albumin: 4.7 g/dL (ref 3.5–5.2)
Alkaline Phosphatase: 82 U/L (ref 39–117)
BUN: 14 mg/dL (ref 6–23)
CO2: 31 meq/L (ref 19–32)
Calcium: 9.8 mg/dL (ref 8.4–10.5)
Chloride: 101 meq/L (ref 96–112)
Creatinine, Ser: 1.12 mg/dL (ref 0.40–1.50)
GFR: 72.12 mL/min
Glucose, Bld: 107 mg/dL — ABNORMAL HIGH (ref 70–99)
Potassium: 4.7 meq/L (ref 3.5–5.1)
Sodium: 137 meq/L (ref 135–145)
Total Bilirubin: 0.6 mg/dL (ref 0.2–1.2)
Total Protein: 7.6 g/dL (ref 6.0–8.3)

## 2016-02-18 LAB — LIPID PANEL
Cholesterol: 235 mg/dL — ABNORMAL HIGH (ref 0–200)
HDL: 61.4 mg/dL
LDL Cholesterol: 140 mg/dL — ABNORMAL HIGH (ref 0–99)
NonHDL: 173.75
Total CHOL/HDL Ratio: 4
Triglycerides: 170 mg/dL — ABNORMAL HIGH (ref 0.0–149.0)
VLDL: 34 mg/dL (ref 0.0–40.0)

## 2016-02-18 LAB — CBC
HCT: 45.9 % (ref 39.0–52.0)
Hemoglobin: 15.7 g/dL (ref 13.0–17.0)
MCHC: 34.2 g/dL (ref 30.0–36.0)
MCV: 93.7 fl (ref 78.0–100.0)
Platelets: 317 10*3/uL (ref 150.0–400.0)
RBC: 4.9 Mil/uL (ref 4.22–5.81)
RDW: 13.9 % (ref 11.5–15.5)
WBC: 6.9 10*3/uL (ref 4.0–10.5)

## 2016-02-18 LAB — HEMOGLOBIN A1C: Hgb A1c MFr Bld: 5.3 % (ref 4.6–6.5)

## 2016-02-18 MED ORDER — ZOSTER VACCINE LIVE 19400 UNT/0.65ML ~~LOC~~ SUSR: 0.6500 mL | Freq: Once | SUBCUTANEOUS | 0 refills | Status: AC

## 2016-02-18 NOTE — Patient Instructions (Signed)
Follow up with ortho.  Follow up BP check in 2 weeks.  We will call with your lab results.  Take care  Dr. Lacinda Axon   Health Maintenance, Male A healthy lifestyle and preventative care can promote health and wellness.  Maintain regular health, dental, and eye exams.  Eat a healthy diet. Foods like vegetables, fruits, whole grains, low-fat dairy products, and lean protein foods contain the nutrients you need and are low in calories. Decrease your intake of foods high in solid fats, added sugars, and salt. Get information about a proper diet from your health care provider, if necessary.  Regular physical exercise is one of the most important things you can do for your health. Most adults should get at least 150 minutes of moderate-intensity exercise (any activity that increases your heart rate and causes you to sweat) each week. In addition, most adults need muscle-strengthening exercises on 2 or more days a week.   Maintain a healthy weight. The body mass index (BMI) is a screening tool to identify possible weight problems. It provides an estimate of body fat based on height and weight. Your health care provider can find your BMI and can help you achieve or maintain a healthy weight. For males 20 years and older:  A BMI below 18.5 is considered underweight.  A BMI of 18.5 to 24.9 is normal.  A BMI of 25 to 29.9 is considered overweight.  A BMI of 30 and above is considered obese.  Maintain normal blood lipids and cholesterol by exercising and minimizing your intake of saturated fat. Eat a balanced diet with plenty of fruits and vegetables. Blood tests for lipids and cholesterol should begin at age 77 and be repeated every 5 years. If your lipid or cholesterol levels are high, you are over age 32, or you are at high risk for heart disease, you may need your cholesterol levels checked more frequently.Ongoing high lipid and cholesterol levels should be treated with medicines if diet and  exercise are not working.  If you smoke, find out from your health care provider how to quit. If you do not use tobacco, do not start.  Lung cancer screening is recommended for adults aged 83-80 years who are at high risk for developing lung cancer because of a history of smoking. A yearly low-dose CT scan of the lungs is recommended for people who have at least a 30-pack-year history of smoking and are current smokers or have quit within the past 15 years. A pack year of smoking is smoking an average of 1 pack of cigarettes a day for 1 year (for example, a 30-pack-year history of smoking could mean smoking 1 pack a day for 30 years or 2 packs a day for 15 years). Yearly screening should continue until the smoker has stopped smoking for at least 15 years. Yearly screening should be stopped for people who develop a health problem that would prevent them from having lung cancer treatment.  If you choose to drink alcohol, do not have more than 2 drinks per day. One drink is considered to be 12 oz (360 mL) of beer, 5 oz (150 mL) of wine, or 1.5 oz (45 mL) of liquor.  Avoid the use of street drugs. Do not share needles with anyone. Ask for help if you need support or instructions about stopping the use of drugs.  High blood pressure causes heart disease and increases the risk of stroke. High blood pressure is more likely to develop in:  People  who have blood pressure in the end of the normal range (100-139/85-89 mm Hg).  People who are overweight or obese.  People who are African American.  If you are 23-27 years of age, have your blood pressure checked every 3-5 years. If you are 82 years of age or older, have your blood pressure checked every year. You should have your blood pressure measured twice--once when you are at a hospital or clinic, and once when you are not at a hospital or clinic. Record the average of the two measurements. To check your blood pressure when you are not at a hospital or  clinic, you can use:  An automated blood pressure machine at a pharmacy.  A home blood pressure monitor.  If you are 70-45 years old, ask your health care provider if you should take aspirin to prevent heart disease.  Diabetes screening involves taking a blood sample to check your fasting blood sugar level. This should be done once every 3 years after age 84 if you are at a normal weight and without risk factors for diabetes. Testing should be considered at a younger age or be carried out more frequently if you are overweight and have at least 1 risk factor for diabetes.  Colorectal cancer can be detected and often prevented. Most routine colorectal cancer screening begins at the age of 71 and continues through age 11. However, your health care provider may recommend screening at an earlier age if you have risk factors for colon cancer. On a yearly basis, your health care provider may provide home test kits to check for hidden blood in the stool. A small camera at the end of a tube may be used to directly examine the colon (sigmoidoscopy or colonoscopy) to detect the earliest forms of colorectal cancer. Talk to your health care provider about this at age 24 when routine screening begins. A direct exam of the colon should be repeated every 5-10 years through age 56, unless early forms of precancerous polyps or small growths are found.  People who are at an increased risk for hepatitis B should be screened for this virus. You are considered at high risk for hepatitis B if:  You were born in a country where hepatitis B occurs often. Talk with your health care provider about which countries are considered high risk.  Your parents were born in a high-risk country and you have not received a shot to protect against hepatitis B (hepatitis B vaccine).  You have HIV or AIDS.  You use needles to inject street drugs.  You live with, or have sex with, someone who has hepatitis B.  You are a man who has  sex with other men (MSM).  You get hemodialysis treatment.  You take certain medicines for conditions like cancer, organ transplantation, and autoimmune conditions.  Hepatitis C blood testing is recommended for all people born from [birth year] through 1966 and any individual with known risk factors for hepatitis C.  Healthy men should no longer receive prostate-specific antigen (PSA) blood tests as part of routine cancer screening. Talk to your health care provider about prostate cancer screening.  Testicular cancer screening is not recommended for adolescents or adult males who have no symptoms. Screening includes self-exam, a health care provider exam, and other screening tests. Consult with your health care provider about any symptoms you have or any concerns you have about testicular cancer.  Practice safe sex. Use condoms and avoid high-risk sexual practices to reduce the spread of sexually  transmitted infections (STIs).  You should be screened for STIs, including gonorrhea and chlamydia if:  You are sexually active and are younger than 24 years.  You are older than 24 years, and your health care provider tells you that you are at risk for this type of infection.  Your sexual activity has changed since you were last screened, and you are at an increased risk for chlamydia or gonorrhea. Ask your health care provider if you are at risk.  If you are at risk of being infected with HIV, it is recommended that you take a prescription medicine daily to prevent HIV infection. This is called pre-exposure prophylaxis (PrEP). You are considered at risk if:  You are a man who has sex with other men (MSM).  You are a heterosexual man who is sexually active with multiple partners.  You take drugs by injection.  You are sexually active with a partner who has HIV.  Talk with your health care provider about whether you are at high risk of being infected with HIV. If you choose to begin PrEP, you should  first be tested for HIV. You should then be tested every 3 months for as long as you are taking PrEP.  Use sunscreen. Apply sunscreen liberally and repeatedly throughout the day. You should seek shade when your shadow is shorter than you. Protect yourself by wearing long sleeves, pants, a wide-brimmed hat, and sunglasses year round whenever you are outdoors.  Tell your health care provider of new moles or changes in moles, especially if there is a change in shape or color. Also, tell your health care provider if a mole is larger than the size of a pencil eraser.  A one-time screening for abdominal aortic aneurysm (AAA) and surgical repair of large AAAs by ultrasound is recommended for men aged 92-75 years who are current or former smokers.  Stay current with your vaccines (immunizations).   This information is not intended to replace advice given to you by your health care provider. Make sure you discuss any questions you have with your health care provider.   Document Released: 12/30/2007 Document Revised: 07/24/2014 Document Reviewed: 11/28/2010 Elsevier Interactive Patient Education [employer].

## 2016-02-18 NOTE — Assessment & Plan Note (Signed)
New problem (to me). Pain uncontrolled/worsening. Refuses medications. Patient has significant osseous arthritis. He's failed medication as well as steroid injection. Most recent note from orthopedics states that they would consider visco injection and MRI. Advised patient to follow up with Orthopedics.

## 2016-02-18 NOTE — Assessment & Plan Note (Signed)
New problem. Blood markedly elevated today. Improved on repeat. Patient states that he has no history of hypertension and his blood pressure is normally controlled. He attributes elevation to pain. Obtaining labs today. Advised him to take his blood pressure at home and return in 2 weeks for blood pressure visit.

## 2016-02-18 NOTE — Assessment & Plan Note (Signed)
Stable off medication at this time.  

## 2016-02-18 NOTE — Progress Notes (Signed)
Pre visit review using our clinic review tool, if applicable. No additional management support is needed unless otherwise documented below in the visit note. 

## 2016-02-18 NOTE — Progress Notes (Signed)
Subjective:  Patient ID: Ian Newman., male    DOB: 04-11-1961  Age: 55 y.o. MRN: [id number]  CC: Establish care  HPI [name]. is a 55 y.o. male presents to the clinic today to establish care. Concerns are below.  L knee pain/OA  Patient states that he's had left knee pain for the past 1.5 years.  He's been diagnosed with left knee osteoarthritis.  He has seen orthopedics and has received steroid injection as well as medication without significant improvement.  Patient continues to have sniffing pain. He states that his pain is severe and located medially and laterally.  Patient refuses to take medications.  Worse with activity.  No known relieving factors.  Patient would like to discuss treatment options today. He wants to see someone who will consider other injections or surgical intervention.  Hyperlipidemia  Unsure of control.  Needs labs.  Was on lipitor previously.  Stopped treatment.  GERD  Stable.  He has stopped his medications.  He states that he's doing well on apple cider vinegar and honey.  PMH, Surgical Hx, Family Hx, Social History reviewed and updated as below.  Past Medical History:  Diagnosis Date  . Bipolar 1 disorder ([place])   . Chicken pox   . Colon polyps   . COPD (chronic obstructive pulmonary disease) ([place])   . Diverticulosis   . Duodenal ulcer   . GERD (gastroesophageal reflux disease)   . Hemorrhoid   . Hyperlipidemia    Past Surgical History:  Procedure Laterality Date  . APPENDECTOMY    . ESOPHAGOGASTRODUODENOSCOPY N/A 12/15/2014   Procedure: ESOPHAGOGASTRODUODENOSCOPY (EGD);  Surgeon: Lollie Sails, MD;  [facility];  Service: Endoscopy;  Laterality: N/A;  . ESOPHAGOGASTRODUODENOSCOPY N/A 01/05/2015   Procedure: ESOPHAGOGASTRODUODENOSCOPY (EGD);  Surgeon: Lollie Sails, MD;  Location: [facility] ENDOSCOPY;  Service: Endoscopy;  Laterality: N/A;  . NASAL SINUS SURGERY    . SKIN GRAFT    . SMALL  INTESTINE SURGERY     tumor removed  . WRIST FUSION     right   Family History  Problem Relation Age of Onset  . Breast cancer Mother   . Hyperlipidemia Father   . Hypertension Father   . Drug abuse Sister   . Mental retardation Sister   . Breast cancer Sister   . Breast cancer Maternal Grandmother   . Mental retardation Sister   . Breast cancer Sister   . Breast cancer Sister   . Breast cancer Sister   . Breast cancer Sister    Social History  Substance Use Topics  . Smoking status: Former Smoker    Packs/day: 0.50  . Smokeless tobacco: Never Used  . Alcohol use No     Comment: occasional   Review of Systems  HENT: Positive for hearing loss.   Eyes: Positive for visual disturbance.  Gastrointestinal: Positive for abdominal pain.  All other systems reviewed and are negative.  Objective:   Today's Vitals: BP (!) 168/122 (BP Location: Left Arm, Patient Position: Sitting, Cuff Size: Normal)   Pulse 74   Temp 98.2 F (36.8 C) (Oral)   Ht 5' 11"  (1.803 m)   Wt 241 lb 4 oz (109.4 kg)   SpO2 99%   BMI 33.65 kg/m   Physical Exam  Constitutional: He is oriented to person, place, and time. He appears well-developed. No distress.  HENT:  Head: Normocephalic and atraumatic.  Mouth/Throat: Oropharynx is clear and moist.  Eyes: Conjunctivae are normal. No  scleral icterus.  Neck: Neck supple.  Cardiovascular: Normal rate and regular rhythm.   No murmur heard. Pulmonary/Chest: Effort normal. He has no wheezes. He has no rales.  Abdominal: Soft. He exhibits no distension. There is no tenderness. There is no rebound and no guarding.  Musculoskeletal:  Knee: Left Inspection - effusion noted  Palpation normal with no warmth, joint line tenderness, patellar tenderness, or condyle tenderness. Ligaments with solid consistent endpoints including ACL, PCL, LCL, MCL.   Neurological: He is alert and oriented to person, place, and time.  Psychiatric:  Normal affect.   Vitals  reviewed.  Assessment & Plan:   Problem List Items Addressed This Visit    Elevated BP    New problem. Blood markedly elevated today. Improved on repeat. Patient states that he has no history of hypertension and his blood pressure is normally controlled. He attributes elevation to pain. Obtaining labs today. Advised him to take his blood pressure at home and return in 2 weeks for blood pressure visit.      Relevant Orders   Comp Met (CMET)   Gastroesophageal reflux disease with esophagitis    Stable off medication at this time.      Hyperlipidemia    Unsure of control. Lipid panel today.      Osteoarthritis of left knee - Primary    New problem (to me). Pain uncontrolled/worsening. Refuses medications. Patient has significant osseous arthritis. He's failed medication as well as steroid injection. Most recent note from orthopedics states that they would consider visco injection and MRI. Advised patient to follow up with Orthopedics.       Other Visit Diagnoses    Screening for deficiency anemia       Relevant Orders   CBC   Obesity (BMI 30.0-34.9)       Relevant Orders   HgB A1c   Lipid Profile      Outpatient Encounter Prescriptions as of 02/18/2016  Medication Sig  . Zoster Vaccine Live, PF, (ZOSTAVAX) 47829 UNT/0.65ML injection Inject 19,400 Units into the skin once.  . [DISCONTINUED] atorvastatin (LIPITOR) 10 MG tablet Take 1 tablet by mouth daily.  . [DISCONTINUED] dicyclomine (BENTYL) 20 MG tablet Take 1 tablet (20 mg total) by mouth every 6 (six) hours as needed. (Patient not taking: Reported on 01/04/2015)  . [DISCONTINUED] famotidine (PEPCID) 20 MG tablet Take 1 tablet (20 mg total) by mouth 2 (two) times daily. (Patient not taking: Reported on 01/04/2015)  . [DISCONTINUED] fluticasone (FLONASE) 50 MCG/ACT nasal spray Place 1 spray into both nostrils daily.  . [DISCONTINUED] Multiple Vitamin (MULTIVITAMIN) tablet Take 1 tablet by mouth daily.  . [DISCONTINUED]  ondansetron (ZOFRAN) 4 MG tablet Take 1 tablet (4 mg total) by mouth every 8 (eight) hours as needed for nausea or vomiting.  . [DISCONTINUED] pantoprazole (PROTONIX) 40 MG tablet Take 1 tablet by mouth daily.  . [DISCONTINUED] sucralfate (CARAFATE) 1 G tablet Take 1 tablet by mouth 2 (two) times daily.   No facility-administered encounter medications on file as of 02/18/2016.     Follow-up: No Follow-up on file.  [place]

## 2016-02-18 NOTE — Assessment & Plan Note (Signed)
Unsure of control.  Lipid panel today. ?

## 2016-03-01 ENCOUNTER — Other Ambulatory Visit: Payer: Self-pay | Admitting: Student

## 2016-03-01 DIAGNOSIS — M1712 Unilateral primary osteoarthritis, left knee: Secondary | ICD-10-CM

## 2016-03-13 ENCOUNTER — Ambulatory Visit
Admission: RE | Admit: 2016-03-13 | Discharge: 2016-03-13 | Disposition: A | Discharge status: Home / Self Care | Payer: Medicare Other | Source: Ambulatory Visit | Attending: Student | Admitting: Student

## 2016-03-13 DIAGNOSIS — M1712 Unilateral primary osteoarthritis, left knee: Secondary | ICD-10-CM | POA: Diagnosis not present

## 2016-03-13 DIAGNOSIS — M25562 Pain in left knee: Secondary | ICD-10-CM | POA: Diagnosis not present

## 2016-03-13 DIAGNOSIS — M25461 Effusion, right knee: Secondary | ICD-10-CM | POA: Diagnosis not present

## 2016-03-13 DIAGNOSIS — M21862 Other specified acquired deformities of left lower leg: Secondary | ICD-10-CM | POA: Insufficient documentation

## 2016-03-13 DIAGNOSIS — M71562 Other bursitis, not elsewhere classified, left knee: Secondary | ICD-10-CM | POA: Insufficient documentation

## 2016-03-16 ENCOUNTER — Telehealth: Payer: Self-pay | Admitting: Family Medicine

## 2016-03-16 ENCOUNTER — Encounter: Payer: Self-pay | Admitting: Emergency Medicine

## 2016-03-16 ENCOUNTER — Emergency Department
Admission: EM | Admit: 2016-03-16 | Discharge: 2016-03-16 | Disposition: A | Discharge status: Home / Self Care | Payer: Medicare Other | Attending: Emergency Medicine | Admitting: Emergency Medicine

## 2016-03-16 ENCOUNTER — Telehealth: Payer: Self-pay | Admitting: *Deleted

## 2016-03-16 DIAGNOSIS — I10 Essential (primary) hypertension: Secondary | ICD-10-CM | POA: Diagnosis not present

## 2016-03-16 DIAGNOSIS — J449 Chronic obstructive pulmonary disease, unspecified: Secondary | ICD-10-CM | POA: Diagnosis not present

## 2016-03-16 DIAGNOSIS — M1712 Unilateral primary osteoarthritis, left knee: Secondary | ICD-10-CM | POA: Diagnosis not present

## 2016-03-16 DIAGNOSIS — M25562 Pain in left knee: Secondary | ICD-10-CM

## 2016-03-16 DIAGNOSIS — F1290 Cannabis use, unspecified, uncomplicated: Secondary | ICD-10-CM | POA: Diagnosis not present

## 2016-03-16 DIAGNOSIS — Z87891 Personal history of nicotine dependence: Secondary | ICD-10-CM | POA: Insufficient documentation

## 2016-03-16 LAB — CBC
HCT: 47.4 % (ref 40.0–52.0)
Hemoglobin: 16.4 g/dL (ref 13.0–18.0)
MCH: 32.5 pg (ref 26.0–34.0)
MCHC: 34.7 g/dL (ref 32.0–36.0)
MCV: 93.8 fL (ref 80.0–100.0)
PLATELETS: 275 10*3/uL (ref 150–440)
RBC: 5.05 MIL/uL (ref 4.40–5.90)
RDW: 13.4 % (ref 11.5–14.5)
WBC: 6.3 10*3/uL (ref 3.8–10.6)

## 2016-03-16 LAB — BASIC METABOLIC PANEL
ANION GAP: 10 (ref 5–15)
BUN: 14 mg/dL (ref 6–20)
CALCIUM: 9.2 mg/dL (ref 8.9–10.3)
CO2: 22 mmol/L (ref 22–32)
CREATININE: 1.02 mg/dL (ref 0.61–1.24)
Chloride: 103 mmol/L (ref 101–111)
GLUCOSE: 158 mg/dL — AB (ref 65–99)
Potassium: 4 mmol/L (ref 3.5–5.1)
Sodium: 135 mmol/L (ref 135–145)

## 2016-03-16 LAB — TROPONIN I: Troponin I: <0.03 ng/mL (ref <0.03)

## 2016-03-16 MED ORDER — ONDANSETRON 4 MG PO TBDP
4.0000 mg | ORAL_TABLET | Freq: Once | ORAL | Status: AC
  Administered 2016-03-16: 4 mg via ORAL

## 2016-03-16 MED ORDER — ONDANSETRON 4 MG PO TBDP
ORAL_TABLET | ORAL | Status: AC
Start: 2016-03-16 — End: 2016-03-16
  Administered 2016-03-16: 4 mg via ORAL
  Filled 2016-03-16: qty 1

## 2016-03-16 MED ORDER — HYDROMORPHONE HCL 1 MG/ML IJ SOLN
1.0000 mg | Freq: Once | INTRAMUSCULAR | Status: AC
  Administered 2016-03-16: 1 mg via INTRAMUSCULAR

## 2016-03-16 MED ORDER — HYDROMORPHONE HCL 1 MG/ML IJ SOLN
INTRAMUSCULAR | Status: AC
  Administered 2016-03-16: 1 mg via INTRAMUSCULAR
  Filled 2016-03-16: qty 1

## 2016-03-16 MED ORDER — OXYCODONE-ACETAMINOPHEN 5-325 MG PO TABS: 1.0000 | ORAL_TABLET | Freq: Four times a day (QID) | ORAL | 0 refills | Status: DC | PRN

## 2016-03-16 MED ORDER — IBUPROFEN 600 MG PO TABS: 600.0000 mg | ORAL_TABLET | Freq: Four times a day (QID) | ORAL | 0 refills | Status: DC | PRN

## 2016-03-16 NOTE — Telephone Encounter (Signed)
FYI Pt transferred to nurse line for high blood pressure readings last night of 202/163 and thi morning 189-133

## 2016-03-16 NOTE — Telephone Encounter (Signed)
See team health note. 

## 2016-03-16 NOTE — ED Triage Notes (Signed)
Pt presents ambulatory to triage with  C/o left knee pain been going on for over a year now and is supposed to have a knee replacement next mth. Pt states pain is unbearable and hurts worse to bear weight and the pain is elevating his blood pressure.

## 2016-03-16 NOTE — Telephone Encounter (Signed)
Pt was called and he stated he was on the way to the hospital.

## 2016-03-16 NOTE — Telephone Encounter (Signed)
FYI

## 2016-03-16 NOTE — ED Provider Notes (Signed)
[facility] Emergency Department Provider Note  Time seen: 2:56 PM  I have reviewed the triage vital signs and the nursing notes.   HISTORY  Chief Complaint Knee Pain and Hypertension    HPI Ian Newman. is a 55 y.o. male with a past medical history of bipolar, COPD, gastric reflux, left knee pain who presents the emergency department for left knee pain and elevated blood pressure. According to the patient for the past several days his left knee pain has been progressively worsening. He states he has been taking his blood pressure at home, and it occasionally reaches [id number] systolic. Patient states his blood pressure appears only be elevated when he is in significant pain. He states at baseline he does not take any blood pressure medications, and his blood pressure typically runs from A999333 systolic. Patient is currently working with an orthopedist to arrange a total knee replacement, has an appointment in 6 days to schedule surgery. Denies any fever.  Past Medical History:  Diagnosis Date  . Bipolar 1 disorder ([place])   . Chicken pox   . Colon polyps   . COPD (chronic obstructive pulmonary disease) ([place])   . Diverticulosis   . Duodenal ulcer   . GERD (gastroesophageal reflux disease)   . Hemorrhoid   . Hyperlipidemia     Patient Active Problem List   Diagnosis Date Noted  . Hyperlipidemia 02/18/2016  . Osteoarthritis of left knee 02/18/2016  . Elevated BP 02/18/2016  . Gastroesophageal reflux disease with esophagitis 12/07/2014    Past Surgical History:  Procedure Laterality Date  . APPENDECTOMY    . ESOPHAGOGASTRODUODENOSCOPY N/A 12/15/2014   Procedure: ESOPHAGOGASTRODUODENOSCOPY (EGD);  Surgeon: Lollie Sails, MD;  Location: [facility] ENDOSCOPY;  Service: Endoscopy;  Laterality: N/A;  . ESOPHAGOGASTRODUODENOSCOPY N/A 01/05/2015   Procedure: ESOPHAGOGASTRODUODENOSCOPY (EGD);  Surgeon: Lollie Sails, MD;  Location: [facility] ENDOSCOPY;  Service:  Endoscopy;  Laterality: N/A;  . NASAL SINUS SURGERY    . SKIN GRAFT    . SMALL INTESTINE SURGERY     tumor removed  . WRIST FUSION     right    Prior to Admission medications   Not on File    Allergies  Allergen Reactions  . Pepcid [Famotidine] Itching    Family History  Problem Relation Age of Onset  . Breast cancer Mother   . Hyperlipidemia Father   . Hypertension Father   . Drug abuse Sister   . Mental retardation Sister   . Breast cancer Sister   . Breast cancer Maternal Grandmother   . Mental retardation Sister   . Breast cancer Sister   . Breast cancer Sister   . Breast cancer Sister   . Breast cancer Sister     Social History Social History  Substance Use Topics  . Smoking status: Former Smoker    Packs/day: 0.50  . Smokeless tobacco: Never Used  . Alcohol use No     Comment: occasional    Review of Systems Constitutional: Negative for fever. Cardiovascular: Negative for chest pain. Respiratory: Negative for shortness of breath. Gastrointestinal: Negative for abdominal pain Musculoskeletal: Left knee pain, chronic Neurological: Negative for headache 10-point ROS otherwise negative.  ____________________________________________   PHYSICAL EXAM:  VITAL SIGNS: ED Triage Vitals  Enc Vitals Group     BP 03/16/16 1203 (!) 161/103     Pulse Rate 03/16/16 1203 75     Resp 03/16/16 1203 18     Temp 03/16/16 1203 97.5 F (36.4 C)  Temp Source 03/16/16 1203 Oral     SpO2 03/16/16 1203 100 %     Weight 03/16/16 1204 240 lb (108.9 kg)     Height 03/16/16 1204 5\' 11"  (1.803 m)     Head Circumference --      Peak Flow --      Pain Score 03/16/16 1204 10     Pain Loc --      Pain Edu? --      Excl. in [place]? --     Constitutional: Alert and oriented. Well appearing and in no distress. Eyes: Normal exam ENT   Head: Normocephalic and atraumatic   Mouth/Throat: Mucous membranes are moist. Cardiovascular: Normal rate, regular rhythm. No  murmur Respiratory: Normal respiratory effort without tachypnea nor retractions. Breath sounds are clear  Gastrointestinal: Soft and nontender. No distention.  Musculoskeletal: Mild edema noted to the left knee with the patient states is chronic. No erythema or warmth. Moderate tenderness to palpation of left knee which again the patient states is chronic. Neurologic:  Normal speech and language. No gross focal neurologic deficits Skin:  Skin is warm, dry and intact.  Psychiatric: Mood and affect are normal.   ____________________________________________    EKG  EKG reviewed and interpreted, so shows normal sinus rhythm at 73 bpm, narrow QRS, normal axis, normal intervals, no ST changes. Normal EKG.  ____________________________________________    INITIAL IMPRESSION / ASSESSMENT AND PLAN / ED COURSE  Pertinent labs & imaging results that were available during my care of the patient were reviewed by me and considered in my medical decision making (see chart for details).  The patient presents for worsening left knee pain. History of osteoarthritis greater than 5 years in the left knee, has been progressively worsening and has become severe over last several months. Patient currently seeing Dr. Roland Rack of orthopedics to arrange a left knee replacement. Patient states he has been very against taking medications in the past, but states it is gone to the point where he cannot sleep at night due to the discomfort is hoping for some type of relief until he can have his surgery. Overall the patient appears well on examination, no distress, blood pressure 160/103, I feel we will not get adequate blood pressure control until we have adequate pain control. We will dose pain medication in the emergency department, discharged with Percocet, 600 mg ibuprofen tablets and have the patient follow-up with Dr. Roland Rack as soon as possible. Patient is agreeable to this plan. Labs are largely  reassuring.  ____________________________________________   FINAL CLINICAL IMPRESSION(S) / ED DIAGNOSES  Left knee pain Osteoarthritis    Harvest Dark, MD 03/16/16 1459

## 2016-03-16 NOTE — Telephone Encounter (Signed)
PLEASE NOTE: All timestamps contained within this report are represented as [place] Standard Time. CONFIDENTIALTY NOTICE: This fax transmission is intended only for the addressee. It contains information that is legally privileged, confidential or otherwise protected from use or disclosure. If you are not the intended recipient, you are strictly prohibited from reviewing, disclosing, copying using or disseminating any of this information or taking any action in reliance on or regarding this information. If you have received this fax in error, please notify us immediately by telephone so that we can arrange for its return to [place]. Phone: [phone], Toll-Free: [phone], Fax: [phone] Page: 1 of 1 Call Id: [id number] [place] Patient Name: Ian Newman DOB: [birth year] Initial Comment Caller states his BP is 180/140. Sharp pains in his head, light flashes. It was 203/163. Last night. Nurse Assessment Nurse: Dimas Chyle, RN, Dellis Filbert Date/Time Eilene Ghazi Time): 03/16/2016 10:35:15 AM Confirm and document reason for call. If symptomatic, describe symptoms. You must click the next button to save text entered. ---Caller states his BP is 180/140. Sharp pains in his head, light flashes. It was 203/163. Last night. Current reading is 190/128. Has the patient traveled out of the country within the last 30 days? ---No Does the patient have any new or worsening symptoms? ---Yes Will a triage be completed? ---Yes Related visit to physician within the last 2 weeks? ---No Does the PT have any chronic conditions? (i.e. diabetes, asthma, etc.) ---No Is this a behavioral health or substance abuse call? ---No Guidelines Guideline Title Affirmed Question Affirmed Notes High Blood Pressure [1] BP # 160 / 100 AND [2] cardiac or neurologic symptoms (e.g., chest pain, difficulty breathing, unsteady gait,  blurred vision) Final Disposition User Go to ED Now Dimas Chyle, RN, [facility] - ED Disagree/Comply: Comply

## 2016-03-22 DIAGNOSIS — M1712 Unilateral primary osteoarthritis, left knee: Secondary | ICD-10-CM | POA: Diagnosis not present

## 2016-03-22 DIAGNOSIS — S83232D Complex tear of medial meniscus, current injury, left knee, subsequent encounter: Secondary | ICD-10-CM | POA: Diagnosis not present

## 2016-03-29 ENCOUNTER — Ambulatory Visit
Admission: RE | Admit: 2016-03-29 | Discharge: 2016-03-29 | Disposition: A | Discharge status: Home / Self Care | Payer: Medicare Other | Source: Ambulatory Visit | Attending: Surgery | Admitting: Surgery

## 2016-03-29 ENCOUNTER — Encounter
Admission: RE | Admit: 2016-03-29 | Discharge: 2016-03-29 | Disposition: A | Discharge status: Home / Self Care | Payer: Medicare Other | Source: Ambulatory Visit | Attending: Surgery | Admitting: Surgery

## 2016-03-29 DIAGNOSIS — Z01810 Encounter for preprocedural cardiovascular examination: Secondary | ICD-10-CM | POA: Insufficient documentation

## 2016-03-29 DIAGNOSIS — Z01812 Encounter for preprocedural laboratory examination: Secondary | ICD-10-CM | POA: Diagnosis not present

## 2016-03-29 DIAGNOSIS — Z87891 Personal history of nicotine dependence: Secondary | ICD-10-CM | POA: Diagnosis not present

## 2016-03-29 DIAGNOSIS — R911 Solitary pulmonary nodule: Secondary | ICD-10-CM | POA: Diagnosis not present

## 2016-03-29 DIAGNOSIS — Z01818 Encounter for other preprocedural examination: Secondary | ICD-10-CM

## 2016-03-29 HISTORY — DX: Neoplasm of unspecified behavior of digestive system: D49.0

## 2016-03-29 HISTORY — DX: Toxic effect of corrosive acids and acid-like substances, accidental (unintentional), initial encounter: T54.2X1A

## 2016-03-29 HISTORY — DX: Corrosion of unspecified body region, unspecified degree: T30.4

## 2016-03-29 LAB — BASIC METABOLIC PANEL
ANION GAP: 5 (ref 5–15)
BUN: 13 mg/dL (ref 6–20)
CHLORIDE: 102 mmol/L (ref 101–111)
CO2: 31 mmol/L (ref 22–32)
CREATININE: 1.09 mg/dL (ref 0.61–1.24)
Calcium: 9.2 mg/dL (ref 8.9–10.3)
GFR calc non Af Amer: >60 mL/min (ref >60)
Glucose, Bld: 97 mg/dL (ref 65–99)
Potassium: 4.1 mmol/L (ref 3.5–5.1)
SODIUM: 138 mmol/L (ref 135–145)

## 2016-03-29 LAB — URINALYSIS COMPLETE WITH MICROSCOPIC (ARMC ONLY)
BACTERIA UA: NONE SEEN
BILIRUBIN URINE: NEGATIVE
GLUCOSE, UA: NEGATIVE mg/dL
KETONES UR: NEGATIVE mg/dL
Leukocytes, UA: NEGATIVE
NITRITE: NEGATIVE
Protein, ur: NEGATIVE mg/dL
SQUAMOUS EPITHELIAL / LPF: NONE SEEN
Specific Gravity, Urine: 1.005 (ref 1.005–1.030)
pH: 6 (ref 5.0–8.0)

## 2016-03-29 LAB — CBC
HEMATOCRIT: 45.8 % (ref 40.0–52.0)
HEMOGLOBIN: 15.6 g/dL (ref 13.0–18.0)
MCH: 32.3 pg (ref 26.0–34.0)
MCHC: 34.1 g/dL (ref 32.0–36.0)
MCV: 94.8 fL (ref 80.0–100.0)
Platelets: 257 10*3/uL (ref 150–440)
RBC: 4.84 MIL/uL (ref 4.40–5.90)
RDW: 13.2 % (ref 11.5–14.5)
WBC: 6.3 10*3/uL (ref 3.8–10.6)

## 2016-03-29 LAB — TYPE AND SCREEN
ABO/RH(D): A POS
Antibody Screen: NEGATIVE

## 2016-03-29 LAB — PROTIME-INR
INR: 0.94
PROTHROMBIN TIME: 12.6 s (ref 11.4–15.2)

## 2016-03-29 LAB — SURGICAL PCR SCREEN
MRSA, PCR: NEGATIVE
STAPHYLOCOCCUS AUREUS: NEGATIVE

## 2016-03-29 LAB — APTT: APTT: 29 s (ref 24–36)

## 2016-03-29 NOTE — Patient Instructions (Signed)
  Your procedure is scheduled on: 04/06/16 Thurs Report to Same Day Surgery 2nd floor medical mall To find out your arrival time please call [phone] between 1PM - 3PM on 04/05/16 Wed Remember: Instructions that are not followed completely may result in serious medical risk, up to and including death, or upon the discretion of your surgeon and anesthesiologist your surgery may need to be rescheduled.    _x___ 1. Do not eat food or drink liquids after midnight. No gum chewing or hard candies.     __x__ 2. No Alcohol for 24 hours before or after surgery.   __x__3. No Smoking for 24 prior to surgery.   ____  4. Bring all medications with you on the day of surgery if instructed.    __x__ 5. Notify your doctor if there is any change in your medical condition     (cold, fever, infections).     Do not wear jewelry, make-up, hairpins, clips or nail polish.  Do not wear lotions, powders, or perfumes. You may wear deodorant.  Do not shave 48 hours prior to surgery. Men may shave face and neck.  Do not bring valuables to the hospital.    [facility] is not responsible for any belongings or valuables.               Contacts, dentures or bridgework may not be worn into surgery.  Leave your suitcase in the car. After surgery it may be brought to your room.  For patients admitted to the hospital, discharge time is determined by your treatment team.   Patients discharged the day of surgery will not be allowed to drive home.    Please read over the following fact sheets that you were given:   [facility] Preparing for Surgery and or MRSA Information   _x___ Take these medicines the morning of surgery with A SIP OF WATER:    1. oxyCODONE-acetaminophen (ROXICET) 5-325 MG tablet if needed  2.  3.  4.  5.  6.  ____ Fleet Enema (as directed)   _x___ Use CHG Soap or sage wipes as directed on instruction sheet   ____ Use inhalers on the day of surgery and bring to hospital day of  surgery  ____ Stop metformin 2 days prior to surgery    ____ Take 1/2 of usual insulin dose the night before surgery and none on the morning of           surgery.   ____ Stop aspirin or coumadin, or plavix  _x__ Stop Anti-inflammatories such as Advil, Aleve, Ibuprofen, Motrin, Naproxen,          Naprosyn, Goodies powders or aspirin products. Ok to take Tylenol.   ____ Stop supplements until after surgery.    ____ Bring C-Pap to the hospital.

## 2016-03-30 NOTE — Pre-Procedure Instructions (Signed)
EKG COMPARED WITH 12/03/14

## 2016-04-05 MED ORDER — CEFAZOLIN SODIUM-DEXTROSE 2-4 GM/100ML-% IV SOLN
2.0000 g | Freq: Once | INTRAVENOUS | Status: AC
  Administered 2016-04-06: 2 g via INTRAVENOUS

## 2016-04-06 ENCOUNTER — Inpatient Hospital Stay
Admission: RE | Admit: 2016-04-06 | Discharge: 2016-04-09 | DRG: 470 | Disposition: A | Discharge status: Home Health | Payer: Medicare Other | Source: Ambulatory Visit | Attending: Surgery | Admitting: Surgery

## 2016-04-06 ENCOUNTER — Ambulatory Visit: Payer: Medicare Other | Admitting: Anesthesiology

## 2016-04-06 ENCOUNTER — Encounter: Payer: Self-pay | Admitting: *Deleted

## 2016-04-06 ENCOUNTER — Encounter
Admission: RE | Disposition: A | Discharge status: Home Health | Payer: Self-pay | Source: Ambulatory Visit | Attending: Surgery

## 2016-04-06 ENCOUNTER — Inpatient Hospital Stay: Payer: Medicare Other

## 2016-04-06 DIAGNOSIS — E785 Hyperlipidemia, unspecified: Secondary | ICD-10-CM | POA: Diagnosis not present

## 2016-04-06 DIAGNOSIS — S83239A Complex tear of medial meniscus, current injury, unspecified knee, initial encounter: Secondary | ICD-10-CM | POA: Diagnosis present

## 2016-04-06 DIAGNOSIS — M25562 Pain in left knee: Secondary | ICD-10-CM | POA: Diagnosis present

## 2016-04-06 DIAGNOSIS — Z96652 Presence of left artificial knee joint: Secondary | ICD-10-CM

## 2016-04-06 DIAGNOSIS — K219 Gastro-esophageal reflux disease without esophagitis: Secondary | ICD-10-CM | POA: Diagnosis not present

## 2016-04-06 DIAGNOSIS — M6281 Muscle weakness (generalized): Secondary | ICD-10-CM

## 2016-04-06 DIAGNOSIS — Z8711 Personal history of peptic ulcer disease: Secondary | ICD-10-CM | POA: Diagnosis not present

## 2016-04-06 DIAGNOSIS — Z96659 Presence of unspecified artificial knee joint: Secondary | ICD-10-CM | POA: Diagnosis not present

## 2016-04-06 DIAGNOSIS — R030 Elevated blood-pressure reading, without diagnosis of hypertension: Secondary | ICD-10-CM | POA: Diagnosis not present

## 2016-04-06 DIAGNOSIS — M1712 Unilateral primary osteoarthritis, left knee: Secondary | ICD-10-CM | POA: Diagnosis not present

## 2016-04-06 DIAGNOSIS — X58XXXA Exposure to other specified factors, initial encounter: Secondary | ICD-10-CM | POA: Diagnosis present

## 2016-04-06 DIAGNOSIS — I10 Essential (primary) hypertension: Secondary | ICD-10-CM | POA: Diagnosis not present

## 2016-04-06 DIAGNOSIS — M179 Osteoarthritis of knee, unspecified: Secondary | ICD-10-CM | POA: Diagnosis not present

## 2016-04-06 DIAGNOSIS — F319 Bipolar disorder, unspecified: Secondary | ICD-10-CM | POA: Diagnosis present

## 2016-04-06 DIAGNOSIS — Z87891 Personal history of nicotine dependence: Secondary | ICD-10-CM | POA: Diagnosis not present

## 2016-04-06 DIAGNOSIS — Z471 Aftercare following joint replacement surgery: Secondary | ICD-10-CM | POA: Diagnosis not present

## 2016-04-06 DIAGNOSIS — Z23 Encounter for immunization: Secondary | ICD-10-CM | POA: Diagnosis not present

## 2016-04-06 DIAGNOSIS — F1290 Cannabis use, unspecified, uncomplicated: Secondary | ICD-10-CM | POA: Diagnosis present

## 2016-04-06 DIAGNOSIS — Z79899 Other long term (current) drug therapy: Secondary | ICD-10-CM | POA: Diagnosis not present

## 2016-04-06 DIAGNOSIS — J449 Chronic obstructive pulmonary disease, unspecified: Secondary | ICD-10-CM | POA: Diagnosis not present

## 2016-04-06 DIAGNOSIS — R262 Difficulty in walking, not elsewhere classified: Secondary | ICD-10-CM

## 2016-04-06 DIAGNOSIS — M2550 Pain in unspecified joint: Secondary | ICD-10-CM | POA: Diagnosis not present

## 2016-04-06 HISTORY — PX: TOTAL KNEE ARTHROPLASTY: SHX125

## 2016-04-06 LAB — URINE DRUG SCREEN, QUALITATIVE (ARMC ONLY)
AMPHETAMINES, UR SCREEN: NOT DETECTED
Barbiturates, Ur Screen: NOT DETECTED
Benzodiazepine, Ur Scrn: NOT DETECTED
COCAINE METABOLITE, UR ~~LOC~~: NOT DETECTED
Cannabinoid 50 Ng, Ur ~~LOC~~: POSITIVE — AB
MDMA (ECSTASY) UR SCREEN: NOT DETECTED
METHADONE SCREEN, URINE: NOT DETECTED
OPIATE, UR SCREEN: NOT DETECTED
Phencyclidine (PCP) Ur S: NOT DETECTED
TRICYCLIC, UR SCREEN: NOT DETECTED

## 2016-04-06 LAB — ABO/RH: ABO/RH(D): A POS

## 2016-04-06 SURGERY — ARTHROPLASTY, KNEE, TOTAL
Anesthesia: Spinal | Site: Knee | Laterality: Left | Wound class: Clean

## 2016-04-06 MED ORDER — KETOROLAC TROMETHAMINE 15 MG/ML IJ SOLN: 30.0000 mg | Freq: Once | INTRAMUSCULAR | Status: DC

## 2016-04-06 MED ORDER — SODIUM CHLORIDE 0.9 % IV SOLN: INTRAVENOUS | Status: DC | PRN

## 2016-04-06 MED ORDER — ACETAMINOPHEN 10 MG/ML IV SOLN
INTRAVENOUS | Status: AC
  Filled 2016-04-06: qty 100

## 2016-04-06 MED ORDER — TRANEXAMIC ACID 1000 MG/10ML IV SOLN
INTRAVENOUS | Status: DC | PRN
  Administered 2016-04-06: 1000 mg via INTRAVENOUS

## 2016-04-06 MED ORDER — ENOXAPARIN SODIUM 40 MG/0.4ML ~~LOC~~ SOLN
40.0000 mg | SUBCUTANEOUS | Status: DC
  Administered 2016-04-07 – 2016-04-09 (×3): 40 mg via SUBCUTANEOUS
  Filled 2016-04-06 (×3): qty 0.4

## 2016-04-06 MED ORDER — BUPIVACAINE LIPOSOME 1.3 % IJ SUSP
INTRAMUSCULAR | Status: AC
  Filled 2016-04-06: qty 20

## 2016-04-06 MED ORDER — FLEET ENEMA 7-19 GM/118ML RE ENEM: 1.0000 | ENEMA | Freq: Once | RECTAL | Status: DC | PRN

## 2016-04-06 MED ORDER — FENTANYL CITRATE (PF) 100 MCG/2ML IJ SOLN
INTRAMUSCULAR | Status: DC | PRN
  Administered 2016-04-06: 100 ug via INTRAVENOUS

## 2016-04-06 MED ORDER — DIPHENHYDRAMINE HCL 12.5 MG/5ML PO ELIX: 12.5000 mg | ORAL_SOLUTION | ORAL | Status: DC | PRN

## 2016-04-06 MED ORDER — SODIUM CHLORIDE 0.9 % IJ SOLN
INTRAMUSCULAR | Status: AC
Start: 2016-04-06 — End: 2016-04-06
  Filled 2016-04-06: qty 50

## 2016-04-06 MED ORDER — BUPIVACAINE HCL (PF) 0.5 % IJ SOLN
INTRAMUSCULAR | Status: DC | PRN
  Administered 2016-04-06: 3 mL

## 2016-04-06 MED ORDER — KCL IN DEXTROSE-NACL 20-5-0.9 MEQ/L-%-% IV SOLN
INTRAVENOUS | Status: DC
  Administered 2016-04-06 – 2016-04-07 (×2): via INTRAVENOUS
  Filled 2016-04-06 (×4): qty 1000

## 2016-04-06 MED ORDER — BUPIVACAINE-EPINEPHRINE (PF) 0.5% -1:200000 IJ SOLN
INTRAMUSCULAR | Status: DC | PRN
  Administered 2016-04-06: 30 mL via PERINEURAL

## 2016-04-06 MED ORDER — ACETAMINOPHEN 650 MG RE SUPP: 650.0000 mg | Freq: Four times a day (QID) | RECTAL | Status: DC | PRN

## 2016-04-06 MED ORDER — LACTATED RINGERS IV SOLN
INTRAVENOUS | Status: DC
  Administered 2016-04-06 (×2): via INTRAVENOUS

## 2016-04-06 MED ORDER — KETOROLAC TROMETHAMINE 15 MG/ML IJ SOLN
15.0000 mg | Freq: Four times a day (QID) | INTRAMUSCULAR | Status: AC
  Administered 2016-04-06 – 2016-04-07 (×3): 15 mg via INTRAVENOUS
  Filled 2016-04-06 (×4): qty 1

## 2016-04-06 MED ORDER — CEFAZOLIN SODIUM-DEXTROSE 2-4 GM/100ML-% IV SOLN
2.0000 g | Freq: Four times a day (QID) | INTRAVENOUS | Status: AC
  Administered 2016-04-06 (×3): 2 g via INTRAVENOUS
  Filled 2016-04-06 (×3): qty 100

## 2016-04-06 MED ORDER — KETAMINE HCL 50 MG/ML IJ SOLN
INTRAMUSCULAR | Status: DC | PRN
  Administered 2016-04-06: 2.2 mg via INTRAMUSCULAR
  Administered 2016-04-06: 20 mg via INTRAMUSCULAR
  Administered 2016-04-06: 2.2 mg via INTRAMUSCULAR

## 2016-04-06 MED ORDER — PHENYLEPHRINE HCL 10 MG/ML IJ SOLN
INTRAMUSCULAR | Status: DC | PRN
  Administered 2016-04-06: 200 ug via INTRAVENOUS
  Administered 2016-04-06: 100 ug via INTRAVENOUS

## 2016-04-06 MED ORDER — SODIUM CHLORIDE 0.9 % IV SOLN
INTRAVENOUS | Status: DC | PRN
  Administered 2016-04-06: 7 ug/kg/min via INTRAVENOUS

## 2016-04-06 MED ORDER — ACETAMINOPHEN 325 MG PO TABS: 650.0000 mg | ORAL_TABLET | Freq: Four times a day (QID) | ORAL | Status: DC | PRN

## 2016-04-06 MED ORDER — ONDANSETRON HCL 4 MG/2ML IJ SOLN: 4.0000 mg | Freq: Once | INTRAMUSCULAR | Status: DC | PRN

## 2016-04-06 MED ORDER — SODIUM CHLORIDE 0.9 % IV SOLN
INTRAVENOUS | Status: DC | PRN
  Administered 2016-04-06: 15 ug/min via INTRAVENOUS

## 2016-04-06 MED ORDER — SODIUM CHLORIDE 0.9 % IV SOLN
INTRAVENOUS | Status: DC | PRN
  Administered 2016-04-06: 60 mL

## 2016-04-06 MED ORDER — ONDANSETRON HCL 4 MG PO TABS: 4.0000 mg | ORAL_TABLET | Freq: Four times a day (QID) | ORAL | Status: DC | PRN

## 2016-04-06 MED ORDER — FENTANYL CITRATE (PF) 100 MCG/2ML IJ SOLN: 25.0000 ug | INTRAMUSCULAR | Status: DC | PRN

## 2016-04-06 MED ORDER — KETOROLAC TROMETHAMINE 30 MG/ML IJ SOLN
INTRAMUSCULAR | Status: AC
  Administered 2016-04-06: 30 mg
  Filled 2016-04-06: qty 1

## 2016-04-06 MED ORDER — PROPOFOL 10 MG/ML IV BOLUS
INTRAVENOUS | Status: DC | PRN
  Administered 2016-04-06 (×2): 22 mg via INTRAVENOUS

## 2016-04-06 MED ORDER — NEOMYCIN-POLYMYXIN B GU 40-200000 IR SOLN
Status: AC
  Filled 2016-04-06: qty 20

## 2016-04-06 MED ORDER — HYDROMORPHONE HCL 1 MG/ML IJ SOLN
1.0000 mg | INTRAMUSCULAR | Status: DC | PRN
  Administered 2016-04-06 – 2016-04-07 (×5): 2 mg via INTRAVENOUS
  Filled 2016-04-06 (×6): qty 2
  Filled 2016-04-06: qty 1

## 2016-04-06 MED ORDER — PROPOFOL 500 MG/50ML IV EMUL
INTRAVENOUS | Status: DC | PRN
  Administered 2016-04-06: 70 ug/kg/min via INTRAVENOUS

## 2016-04-06 MED ORDER — CEFAZOLIN SODIUM-DEXTROSE 2-4 GM/100ML-% IV SOLN
INTRAVENOUS | Status: AC
  Filled 2016-04-06: qty 100

## 2016-04-06 MED ORDER — ACETAMINOPHEN 500 MG PO TABS
1000.0000 mg | ORAL_TABLET | Freq: Four times a day (QID) | ORAL | Status: AC
  Administered 2016-04-06 (×3): 1000 mg via ORAL
  Filled 2016-04-06 (×4): qty 2

## 2016-04-06 MED ORDER — METOCLOPRAMIDE HCL 5 MG/ML IJ SOLN: 5.0000 mg | Freq: Three times a day (TID) | INTRAMUSCULAR | Status: DC | PRN

## 2016-04-06 MED ORDER — MIDAZOLAM HCL 5 MG/5ML IJ SOLN
INTRAMUSCULAR | Status: DC | PRN
  Administered 2016-04-06: 2 mg via INTRAVENOUS

## 2016-04-06 MED ORDER — VASOPRESSIN 20 UNIT/ML IV SOLN
INTRAVENOUS | Status: DC | PRN
  Administered 2016-04-06: 1.5 [IU] via INTRAVENOUS

## 2016-04-06 MED ORDER — INFLUENZA VAC SPLIT QUAD 0.5 ML IM SUSY
0.5000 mL | PREFILLED_SYRINGE | INTRAMUSCULAR | Status: AC
  Administered 2016-04-07: 0.5 mL via INTRAMUSCULAR
  Filled 2016-04-06: qty 0.5

## 2016-04-06 MED ORDER — BUPIVACAINE-EPINEPHRINE (PF) 0.5% -1:200000 IJ SOLN
INTRAMUSCULAR | Status: AC
  Filled 2016-04-06: qty 30

## 2016-04-06 MED ORDER — METOCLOPRAMIDE HCL 10 MG PO TABS: 5.0000 mg | ORAL_TABLET | Freq: Three times a day (TID) | ORAL | Status: DC | PRN

## 2016-04-06 MED ORDER — OXYCODONE HCL 5 MG PO TABS
5.0000 mg | ORAL_TABLET | ORAL | Status: DC | PRN
  Administered 2016-04-06: 10 mg via ORAL
  Administered 2016-04-07: 5 mg via ORAL
  Administered 2016-04-07: 10 mg via ORAL
  Administered 2016-04-07: 5 mg via ORAL
  Administered 2016-04-07 – 2016-04-08 (×4): 10 mg via ORAL
  Filled 2016-04-06 (×3): qty 2
  Filled 2016-04-06 (×2): qty 1
  Filled 2016-04-06 (×3): qty 2

## 2016-04-06 MED ORDER — ACETAMINOPHEN 10 MG/ML IV SOLN
INTRAVENOUS | Status: DC | PRN
  Administered 2016-04-06: 1000 mg via INTRAVENOUS

## 2016-04-06 MED ORDER — ONDANSETRON HCL 4 MG/2ML IJ SOLN: 4.0000 mg | Freq: Four times a day (QID) | INTRAMUSCULAR | Status: DC | PRN

## 2016-04-06 MED ORDER — MAGNESIUM HYDROXIDE 400 MG/5ML PO SUSP
30.0000 mL | Freq: Every day | ORAL | Status: DC | PRN
  Administered 2016-04-07: 30 mL via ORAL
  Filled 2016-04-06 (×2): qty 30

## 2016-04-06 MED ORDER — BISACODYL 10 MG RE SUPP
10.0000 mg | Freq: Every day | RECTAL | Status: DC | PRN
  Administered 2016-04-08: 10 mg via RECTAL
  Filled 2016-04-06: qty 1

## 2016-04-06 MED ORDER — GLYCOPYRROLATE 0.2 MG/ML IJ SOLN
INTRAMUSCULAR | Status: DC | PRN
  Administered 2016-04-06: 0.3 mg via INTRAVENOUS

## 2016-04-06 MED ORDER — TRANEXAMIC ACID 1000 MG/10ML IV SOLN
INTRAVENOUS | Status: AC
  Filled 2016-04-06: qty 10

## 2016-04-06 MED ORDER — DOCUSATE SODIUM 100 MG PO CAPS
100.0000 mg | ORAL_CAPSULE | Freq: Two times a day (BID) | ORAL | Status: DC
  Administered 2016-04-06 – 2016-04-09 (×7): 100 mg via ORAL
  Filled 2016-04-06 (×7): qty 1

## 2016-04-06 MED ORDER — NEOMYCIN-POLYMYXIN B GU 40-200000 IR SOLN
Status: DC | PRN
  Administered 2016-04-06: 16 mL

## 2016-04-06 MED ORDER — PANTOPRAZOLE SODIUM 40 MG PO TBEC
40.0000 mg | DELAYED_RELEASE_TABLET | Freq: Every day | ORAL | Status: DC
  Administered 2016-04-06 – 2016-04-09 (×4): 40 mg via ORAL
  Filled 2016-04-06 (×4): qty 1

## 2016-04-06 SURGICAL SUPPLY — 61 items
BANDAGE ELASTIC 6 CLIP ST LF (GAUZE/BANDAGES/DRESSINGS) ×3 IMPLANT
BLADE SAW SAG 25X90X1.19 (BLADE) ×3 IMPLANT
BLADE SURG SZ20 CARB STEEL (BLADE) ×3 IMPLANT
BNDG COHESIVE 6X5 TAN STRL LF (GAUZE/BANDAGES/DRESSINGS) ×3 IMPLANT
BONE CEMENT PALACOSE (Orthopedic Implant) ×6 IMPLANT
BOWL CEMENT MIX W/ADAPTER (MISCELLANEOUS) ×1 IMPLANT
CANISTER SUCT 1200ML W/VALVE (MISCELLANEOUS) ×3 IMPLANT
CANISTER SUCT 3000ML (MISCELLANEOUS) ×3 IMPLANT
CAPT KNEE TOTAL 3 ×2 IMPLANT
CATH TRAY METER 16FR LF (MISCELLANEOUS) ×3 IMPLANT
CEMENT BONE PALACOSE (Orthopedic Implant) ×2 IMPLANT
CHLORAPREP W/TINT 26ML (MISCELLANEOUS) ×3 IMPLANT
COMPACT MIXING SYSTEM ×2 IMPLANT
COOLER POLAR GLACIER W/PUMP (MISCELLANEOUS) ×3 IMPLANT
COVER MAYO STAND STRL (DRAPES) ×3 IMPLANT
CUFF TOURN 24 STER (MISCELLANEOUS) IMPLANT
CUFF TOURN 30 STER DUAL PORT (MISCELLANEOUS) ×2 IMPLANT
DRAPE IMP U-DRAPE 54X76 (DRAPES) ×3 IMPLANT
DRAPE INCISE IOBAN 66X45 STRL (DRAPES) ×6 IMPLANT
DRSG OPSITE POSTOP 4X10 (GAUZE/BANDAGES/DRESSINGS) ×2 IMPLANT
DRSG OPSITE POSTOP 4X12 (GAUZE/BANDAGES/DRESSINGS) ×1 IMPLANT
DRSG OPSITE POSTOP 4X14 (GAUZE/BANDAGES/DRESSINGS) ×1 IMPLANT
ELECT CAUTERY BLADE 6.4 (BLADE) ×3 IMPLANT
ELECT REM PT RETURN 9FT ADLT (ELECTROSURGICAL) ×3
ELECTRODE REM PT RTRN 9FT ADLT (ELECTROSURGICAL) ×1 IMPLANT
GLOVE BIO SURGEON STRL SZ7.5 (GLOVE) ×6 IMPLANT
GLOVE BIO SURGEON STRL SZ8 (GLOVE) ×6 IMPLANT
GLOVE BIOGEL PI IND STRL 8 (GLOVE) ×1 IMPLANT
GLOVE BIOGEL PI INDICATOR 8 (GLOVE) ×2
GLOVE INDICATOR 8.0 STRL GRN (GLOVE) ×3 IMPLANT
GOWN STRL REUS W/ TWL LRG LVL3 (GOWN DISPOSABLE) ×2 IMPLANT
GOWN STRL REUS W/ TWL XL LVL3 (GOWN DISPOSABLE) ×1 IMPLANT
GOWN STRL REUS W/TWL LRG LVL3 (GOWN DISPOSABLE) ×6
GOWN STRL REUS W/TWL XL LVL3 (GOWN DISPOSABLE) ×3
HANDPIECE INTERPULSE COAX TIP (DISPOSABLE) ×3
HOLDER FOLEY CATH W/STRAP (MISCELLANEOUS) ×3 IMPLANT
HOOD PEEL AWAY FLYTE STAYCOOL (MISCELLANEOUS) ×9 IMPLANT
IMMBOLIZER KNEE 19 BLUE UNIV (SOFTGOODS) ×3 IMPLANT
KIT RM TURNOVER STRD PROC AR (KITS) ×3 IMPLANT
NDL 18GX1X1/2 (RX/OR ONLY) (NEEDLE) ×1 IMPLANT
NDL SAFETY 18GX1.5 (NEEDLE) ×3 IMPLANT
NDL SPNL 20GX3.5 QUINCKE YW (NEEDLE) ×1 IMPLANT
NEEDLE 18GX1X1/2 (RX/OR ONLY) (NEEDLE) ×3 IMPLANT
NEEDLE SPNL 20GX3.5 QUINCKE YW (NEEDLE) ×3 IMPLANT
NS IRRIG 1000ML POUR BTL (IV SOLUTION) ×3 IMPLANT
PACK TOTAL KNEE (MISCELLANEOUS) ×3 IMPLANT
PAD WRAPON POLAR KNEE (MISCELLANEOUS) ×1 IMPLANT
SET HNDPC FAN SPRY TIP SCT (DISPOSABLE) ×1 IMPLANT
SOL .9 NS 3000ML IRR  AL (IV SOLUTION) ×2
SOL .9 NS 3000ML IRR AL (IV SOLUTION) ×1
SOL .9 NS 3000ML IRR UROMATIC (IV SOLUTION) ×1 IMPLANT
STAPLER SKIN PROX 35W (STAPLE) ×3 IMPLANT
SUCTION FRAZIER HANDLE 10FR (MISCELLANEOUS) ×2
SUCTION TUBE FRAZIER 10FR DISP (MISCELLANEOUS) ×1 IMPLANT
SUT VIC AB 0 CT1 36 (SUTURE) ×15 IMPLANT
SUT VIC AB 2-0 CT1 27 (SUTURE) ×9
SUT VIC AB 2-0 CT1 TAPERPNT 27 (SUTURE) ×5 IMPLANT
SYR 20CC LL (SYRINGE) ×3 IMPLANT
SYR 30ML LL (SYRINGE) ×3 IMPLANT
SYRINGE 10CC LL (SYRINGE) ×3 IMPLANT
WRAPON POLAR PAD KNEE (MISCELLANEOUS) ×3

## 2016-04-06 NOTE — Care Management Note (Addendum)
Case Management Note  Patient Details  Name: Ian Newman. MRN: [id number] Date of Birth: [birth year]  Subjective/Objective: POD left TKA. Met with patient and spouse, Jeral Fruit [phone]) at bedside. Patient in a lot of pain so mostly spoke with spouse. She will be his care giver at home. Discussed home health PT. No preference. [facility] [phone]. Called Lovenox 40 mg #14, no refills. Cost is $ 93.51. Walker ordered from [facility].   Action/Plan: [facility] PT. Called L[facility]ox to [facility]. Walker ordered.   Expected Discharge Date:                  Expected Discharge Plan:  [place]  In-House Referral:     Discharge planning Services  CM Consult  Post Acute Care Choice:  Durable Medical Equipment, Home Health Choice offered to:  Patient, Spouse  DME Arranged:  [place] Rile DME Agency:  [place]:  PT [place]:  [facility] ([facility])  Status of Service:  In process, will continue to follow  If discussed at Long Length of Stay Meetings, dates discussed:    Additional Comments:  Jolly Mango, RN 04/06/2016, 1:46 PM

## 2016-04-06 NOTE — Anesthesia Preprocedure Evaluation (Signed)
Anesthesia Evaluation  Patient identified by MRN, date of birth, ID band Patient awake    Reviewed: Allergy & Precautions, NPO status , Patient's Chart, lab work & pertinent test results  History of Anesthesia Complications Negative for: history of anesthetic complications  Airway Mallampati: II       Dental   Pulmonary COPD (pt denies), former smoker,           Cardiovascular negative cardio ROS       Neuro/Psych Bipolar Disorder    GI/Hepatic Neg liver ROS, PUD, GERD  ,  Endo/Other  negative endocrine ROS  Renal/GU negative Renal ROS     Musculoskeletal   Abdominal   Peds  Hematology negative hematology ROS (+)   Anesthesia Other Findings   Reproductive/Obstetrics                             Anesthesia Physical Anesthesia Plan  ASA: II  Anesthesia Plan: Spinal   Post-op Pain Management:    Induction:   Airway Management Planned:   Additional Equipment:   Intra-op Plan:   Post-operative Plan:   Informed Consent: I have reviewed the patients History and Physical, chart, labs and discussed the procedure including the risks, benefits and alternatives for the proposed anesthesia with the patient or authorized representative who has indicated his/her understanding and acceptance.     Plan Discussed with:   Anesthesia Plan Comments:         Anesthesia Quick Evaluation

## 2016-04-06 NOTE — NC FL2 (Signed)
[place] LEVEL OF CARE SCREENING TOOL     IDENTIFICATION  Patient Name: Ian Newman. Birthdate: [birth year] Sex: male Admission Date (Current Location): 04/06/2016  [place] and [place] Number:  Engineering geologist and Address:  [facility], [address] [place], [place] [address]      Provider Number: [id number]  Attending Physician Name and Address:  Corky Mull, MD  Relative Name and Phone Number:       Current Level of Care: Hospital Recommended Level of Care: [place] Prior Approval Number:    Date Approved/Denied:   PASRR Number:  ([id number])  Discharge Plan: SNF    Current Diagnoses: Patient Active Problem List   Diagnosis Date Noted  . Status post total knee replacement using cement 04/06/2016  . Hyperlipidemia 02/18/2016  . Osteoarthritis of left knee 02/18/2016  . Elevated BP 02/18/2016  . Gastroesophageal reflux disease with esophagitis 12/07/2014   Bipolar disorder (CMS-HCC)    Allergic state    Ulcer (CMS-HCC)    Hemorrhoid    Chickenpox    Encounter for blood transfusion    GERD (gastroesophageal reflux disease)    Erosive gastritis 01/05/2015   Erosive esophagitis 01/05/2015   Reflux esophagitis       Orientation RESPIRATION BLADDER Height & Weight     Self, Time, Situation, Place  O2 (2 Liters Oxygen ) Continent Weight:   Height:     BEHAVIORAL SYMPTOMS/MOOD NEUROLOGICAL BOWEL NUTRITION STATUS   (none)  (none) Continent Diet (Regualr )  AMBULATORY STATUS COMMUNICATION OF NEEDS Skin   Extensive Assist Verbally Surgical wounds (Incision: Left Knee )                       Personal Care Assistance Level of Assistance  Bathing, Feeding, Dressing Bathing Assistance: Limited assistance Feeding assistance: Independent Dressing Assistance: Limited assistance     Functional Limitations Info  Sight, Hearing, Speech Sight Info: Adequate Hearing Info:  Adequate Speech Info: Adequate    SPECIAL CARE FACTORS FREQUENCY  PT (By licensed PT), OT (By licensed OT)     PT Frequency:  (5) OT Frequency:  (5)            Contractures      Additional Factors Info  Code Status, Allergies Code Status Info:  (Full Code. ) Allergies Info:  (Pepcid Famotidine)           Current Medications (04/06/2016):  This is the current hospital active medication list Current Facility-Administered Medications  Medication Dose Route Frequency Provider Last Rate Last Dose  . acetaminophen (TYLENOL) tablet 650 mg  650 mg Oral Q6H PRN Corky Mull, MD       Or  . acetaminophen (TYLENOL) suppository 650 mg  650 mg Rectal Q6H PRN Corky Mull, MD      . acetaminophen (TYLENOL) tablet 1,000 mg  1,000 mg Oral Q6H Corky Mull, MD      . bisacodyl (DULCOLAX) suppository 10 mg  10 mg Rectal Daily PRN Corky Mull, MD      . ceFAZolin (ANCEF) IVPB 2g/100 mL premix  2 g Intravenous Q6H Corky Mull, MD      . dextrose 5 % and 0.9 % NaCl with KCl 20 mEq/L infusion   Intravenous Continuous Corky Mull, MD      . diphenhydrAMINE (BENADRYL) 12.5 MG/5ML elixir 12.5-25 mg  12.5-25 mg Oral Q4H PRN Corky Mull, MD      .  docusate sodium (COLACE) capsule 100 mg  100 mg Oral BID Corky Mull, MD      . Derrill Memo ON 04/07/2016] enoxaparin (LOVENOX) injection 40 mg  40 mg Subcutaneous Q24H Corky Mull, MD      . ketorolac (TORADOL) 15 MG/ML injection 15 mg  15 mg Intravenous Q6H Corky Mull, MD      . magnesium hydroxide (MILK OF MAGNESIA) suspension 30 mL  30 mL Oral Daily PRN Corky Mull, MD      . metoCLOPramide (REGLAN) tablet 5-10 mg  5-10 mg Oral Q8H PRN Corky Mull, MD       Or  . metoCLOPramide (REGLAN) injection 5-10 mg  5-10 mg Intravenous Q8H PRN Corky Mull, MD      . ondansetron [facility]) tablet 4 mg  4 mg Oral Q6H PRN Corky Mull, MD       Or  . ondansetron (ZOFRAN) injection 4 mg  4 mg Intravenous Q6H PRN Corky Mull, MD      . oxyCODONE (Oxy  IR/ROXICODONE) immediate release tablet 5-10 mg  5-10 mg Oral Q3H PRN Corky Mull, MD      . pantoprazole (PROTONIX) EC tablet 40 mg  40 mg Oral Daily Corky Mull, MD      . sodium phosphate (FLEET) 7-19 GM/118ML enema 1 enema  1 enema Rectal Once PRN Corky Mull, MD         Discharge Medications: Please see discharge summary for a list of discharge medications.  Relevant Imaging Results:  Relevant Lab Results:   Additional Information  (SSN: [id number])  Lot [place], Veronia Beets, LCSW

## 2016-04-06 NOTE — H&P (Signed)
Paper H&P to be scanned into permanent record. H&P reviewed. No changes. 

## 2016-04-06 NOTE — Evaluation (Signed)
Physical Therapy Evaluation Patient Details Name: Dustan Reisman. MRN: [id number] DOB: [birth year] Today's Date: 04/06/2016   History of Present Illness  Pt is admitted for L TKR. Pt very limited secondary to pain during evaluation attempt. RN aware and on phone with MD to get more pain meds  Clinical Impression  Pt is a pleasant 55 year old male who was admitted for L TKR. Upon entering room, pt reports he is in severe pain. Therapist asked if he wanted to try different position to help with pain relief and pt agreeable to attempt getting to recliner. Pt performs bed mobility with mod assist and able to sit at EOB for approx a few minutes and then requests to return back supine secondary to excruciating pain. Pt unable to further tolerate mobility this date. Pt demonstrates deficits with strength/ROM/moblity. Would benefit from skilled PT to address above deficits and promote optimal return to PLOF. At this time, recommend SNF secondary to severe pain, however expect that patient may progress as he is very motivated to return home. Unable to perform SLR without pain at this time, KI in room; continue to assess for need.      Follow Up Recommendations SNF (secondary to pain control-continue to assess)    Equipment Recommendations  Rolling walker with 5" wheels;3in1 (PT)    Recommendations for Other Services       Precautions / Restrictions Precautions Precautions: Fall;Knee Precaution Booklet Issued: No Restrictions Weight Bearing Restrictions: Yes LLE Weight Bearing: Weight bearing as tolerated      Mobility  Bed Mobility Overal bed mobility: Needs Assistance Bed Mobility: Supine to Sit     Supine to sit: Mod assist     General bed mobility comments: assist for sequencing and scooting out towards EOB. Heavy use of bedrails to assist with scooting out towards EOB. Unable to let L LE down towards floor secondary to pain. Requested to return back supine in bed.  Transfers                  General transfer comment: unable to perform at this time  Ambulation/Gait                Stairs            Wheelchair Mobility    Modified Rankin (Stroke Patients Only)       Balance                                             Pertinent Vitals/Pain Pain Assessment: 0-10 Pain Score: 10-Worst pain ever Pain Location: L knee Pain Descriptors / Indicators: Operative site guarding;Discomfort Pain Intervention(s): Ice applied;Repositioned;Limited activity within patient's tolerance;Patient requesting pain meds-RN notified;Relaxation    Home Living Family/patient expects to be discharged to:: Private residence Living Arrangements: Spouse/significant other;Children Available Help at Discharge: Family Type of Home: House Home Access: Stairs to enter Entrance Stairs-Rails: Can reach both Entrance Stairs-Number of Steps: 3 Home Layout: One level Home Equipment: None      Prior Function Level of Independence: Independent         Comments: limited mobility prior to [place] secondary to pain.     Hand Dominance        Extremity/Trunk Assessment   Upper Extremity Assessment: Overall WFL for tasks assessed           Lower Extremity Assessment: Generalized weakness (  L LE grossly at least 3/5 evidenced by funcitonal movements)         Communication   Communication: No difficulties  Cognition Arousal/Alertness: Awake/alert Behavior During Therapy: WFL for tasks assessed/performed Overall Cognitive Status: Within Functional Limits for tasks assessed                      General Comments      Exercises Total Joint Exercises Goniometric ROM: 15 degrees of extension Other Exercises Other Exercises: unable to tolerate ther-ex this date   Assessment/Plan    PT Assessment Patient needs continued PT services  PT Problem List Decreased strength;Decreased balance;Decreased mobility;Pain          PT  Treatment Interventions DME instruction;Gait training;Therapeutic exercise    PT Goals (Current goals can be found in the Care Plan section)  Acute Rehab PT Goals Patient Stated Goal: to go home PT Goal Formulation: With patient Time For Goal Achievement: 04/20/16 Potential to Achieve Goals: Good    Frequency BID   Barriers to discharge        Co-evaluation               End of Session   Activity Tolerance: Patient limited by pain Patient left: in bed;with bed alarm set;with SCD's reapplied Nurse Communication: Mobility status;Patient requests pain meds;Weight bearing status         Time: 1510-1530 PT Time Calculation (min) (ACUTE ONLY): 20 min   Charges:   PT Evaluation $PT Eval Moderate Complexity: 1 Procedure     PT G Codes:        Tilia Faso May 02, 2016, 4:59 PM Greggory Stallion, PT, DPT [phone]

## 2016-04-06 NOTE — Transfer of Care (Signed)
Immediate Anesthesia Transfer of Care Note  Patient: Ian Newman.  Procedure(s) Performed: Procedure(s): TOTAL KNEE ARTHROPLASTY (Left)  Patient Location: PACU  Anesthesia Type:Spinal  Level of Consciousness: awake, alert , oriented and patient cooperative  Airway & Oxygen Therapy: Patient Spontanous Breathing and Patient connected to nasal cannula oxygen  Post-op Assessment: Report given to RN and Post -op Vital signs reviewed and stable  Post vital signs: Reviewed and stable  Last Vitals:  Vitals:   04/06/16 0624 04/06/16 1014  BP: (!) 168/92 109/75  Pulse: 84 80  Resp: 18 17  Temp: 36.1 C 36.2 C    Last Pain:  Vitals:   04/06/16 0624  TempSrc: Tympanic  PainSc: 8          Complications: No apparent anesthesia complications

## 2016-04-06 NOTE — Anesthesia Procedure Notes (Signed)
Spinal  Patient location during procedure: OR Start time: 04/06/2016 7:15 AM End time: 04/06/2016 7:38 AM Preanesthetic Checklist Completed: patient identified, site marked, surgical consent, pre-op evaluation, timeout performed, IV checked, risks and benefits discussed and monitors and equipment checked Spinal Block Patient position: sitting Prep: Betadine Patient monitoring: heart rate, continuous pulse ox, blood pressure and cardiac monitor Approach: midline Location: L4-5 Injection technique: single-shot Needle Needle type: [place] and Introducer  Needle gauge: 24 G Needle length: 9 cm Needle insertion depth: 9 cm Additional Notes Negative paresthesia. Negative blood return. Positive free-flowing CSF. Expiration date of kit checked and confirmed. Patient tolerated procedure well, without complications.

## 2016-04-06 NOTE — Op Note (Signed)
04/06/2016  9:57 AM  Patient:   Ian Newman.  Pre-Op Diagnosis:   Degenerative joint disease, left knee.  Post-Op Diagnosis:   Same  Procedure:   left TKA using all-cemented [employer] Vanguard system with a 70 mm PCR femur, a 79 mm tibial tray with a 12 mm E-poly insert, and a 37 x 10 mm all-poly 3-pegged domed patella  Surgeon:   Pascal Lux, MD  Assistant:   Cameron Proud, PA-C; Donnie Coffin, PA-S   Anesthesia:   Spinal  Findings:   As above  Complications:   None  EBL:   10 cc  Fluids:   950 cc crystalloid  UOP:  200 cc  TT:   100 minutes at 300 mmHg  Drains:   None  Closure:   Staples  Implants:   As above  Brief Clinical Note:   The patient is a 55 year old male with a history of progressively worsening left knee pain. The patient's symptoms have progressed despite medications, activity modification, injections, etc. The patient's history and examination were consistent with advanced degenerative joint disease of the right knee confirmed by plain radiographs. The patient presents at this time for a left total knee arthroplasty.  Procedure:   The patient was brought into the operating room. After adequate spinal anesthesia was obtained, the patient was lain in the supine position. A Foley catheter was placed by the nurse before the right lower extremity was prepped with ChloraPrep solution and draped sterilely. Preoperative antibiotics were administered. After verifying the proper laterality with a surgical timeout, the limb was exsanguinated with an Esmarch and the tourniquet inflated to 300 mmHg. A standard anterior approach to the knee was made through an approximately 7 inch incision. The incision was carried down through the subcutaneous tissues to expose superficial retinaculum. This was split the length of the incision and the medial flap elevated sufficiently to expose the medial retinaculum. The medial retinaculum was incised, leaving a 3-4 mm cuff of tissue on  the patella. This was extended distally along the medial border of the patellar tendon and proximally through the medial third of the quadriceps tendon. A subtotal fat pad excision was performed before the soft tissues were elevated off the anteromedial and anterolateral aspects of the proximal tibia to the level of the collateral ligaments. The anterior portions of the medial and lateral menisci were removed, as was the anterior cruciate ligament. With the knee flexed to 90, the external tibial guide was positioned and the appropriate proximal tibial cut made. This piece was taken to the back table where it was measured and found to be optimally replicated by a 79 mm component.  Attention was directed to the distal femur. The intramedullary canal was accessed through a 3/8" drill hole. The intramedullary guide was inserted and position in order to obtain a neutral flexion gap. The intercondylar block was positioned with care taken to avoid notching the anterior cortex of the femur. The appropriate cut was made. Next, the distal cutting block was placed at 6 of valgus alignment. Using the 9 mm slot, the distal cut was made. The distal femur was measured and found to be optimally replicated by the 70 mm component. The 70 mm 4-in-1 cutting block was positioned and first the posterior, then the posterior chamfer, the anterior chamfer, femoral and intercondylar cuts were made. At this point, the posterior portions medial and lateral menisci were removed. A trial reduction was performed using the appropriate femoral and tibial components with first  the 10 mm and then the 12 mm insert. The 12 mm insert demonstrated excellent stability to varus and valgus stressing both in flexion and extension while permitting full extension. Patella tracking was assessed and found to be excellent. Therefore, the tibial guide position was marked on the proximal tibia. The patella thickness was measured and found to be 22 mm.  Therefore, the appropriate cut was made. The surfaces were measured and found to be optimally replicated by the 37 mm component. The three peg holes were drilled in place before the trial button was inserted. Patella tracking was assessed and found to be excellent, passing the "no thumb test". The lug holes were drilled into the distal femur before the trial component was removed, leaving only the tibial tray. The keel was then created using the appropriate tower, reamer, and punch.  The bony surfaces were prepared for cementing by irrigating thoroughly with bacitracin saline solution. A bone plug was fashioned from some of the bone that had been removed previously and used to plug the distal femoral canal. In addition, 20 cc of Exparel diluted out to 60 cc with normal saline and 30 cc of 0.5% Sensorcaine were injected into the postero-medial and postero-lateral aspects of the knee, the medial and lateral gutter regions, and the peri-incisional tissues to help with postoperative analgesia. Meanwhile, the cement was being mixed on the back table. When it was ready, the tibial tray was cemented in first. The excess cement was removed using [employer] fast streamer. Next, the femoral component was impacted into place. Again, the excess cement was removed using [employer] fast streamer. The 12 mm trial insert was positioned and the knee brought into extension while the cement hardened. Finally, the patella was cemented into place and secured using the patellar clamp. Again, the excess cement was removed using [employer] fast streamer. Once the cement had hardened, the knee was placed through a range of motion with the findings as described above. Therefore, the trial insert was removed and, after verifying that no cement had been retained posteriorly, the permanent insert was positioned and secured using the appropriate key locking mechanism. Again the knee was placed through a range of motion with the findings as described above.  The wound  was copiously irrigated with bacitracin saline solution using the jet lavage system before the quadriceps tendon and retinacular layer were reapproximated using #0 Vicryl interrupted sutures. The superficial retinacular layer was closed using 2-0 Vicryl interrupted sutures in several layers for the skin was closed using staples. A sterile honeycomb dressing was applied to the skin before the leg was wrapped with an Ace wrap to accommodate the polar pack. The patient was then awakened and returned to the recovery room in satisfactory condition after tolerating the procedure well.

## 2016-04-06 NOTE — Progress Notes (Signed)
Paged MD for pain medication, orders that are in place not effective. New order placed see MAR.

## 2016-04-06 NOTE — Care Management (Signed)
Notified patients wife, Jeral Fruit of cost  Lovenox. She states they are not able to purchase medication until next Friday. TC to Dr. Roland Rack. He will switch medication to [facility]SA. Wife updated.

## 2016-04-07 ENCOUNTER — Encounter: Payer: Self-pay | Admitting: Surgery

## 2016-04-07 LAB — CBC WITH DIFFERENTIAL/PLATELET
Basophils Absolute: 0.1 10*3/uL (ref 0–0.1)
Basophils Relative: 1 %
EOS ABS: 0.2 10*3/uL (ref 0–0.7)
EOS PCT: 3 %
HCT: 38.4 % — ABNORMAL LOW (ref 40.0–52.0)
Hemoglobin: 13.3 g/dL (ref 13.0–18.0)
LYMPHS ABS: 1.4 10*3/uL (ref 1.0–3.6)
Lymphocytes Relative: 18 %
MCH: 32.8 pg (ref 26.0–34.0)
MCHC: 34.7 g/dL (ref 32.0–36.0)
MCV: 94.5 fL (ref 80.0–100.0)
MONO ABS: 0.7 10*3/uL (ref 0.2–1.0)
MONOS PCT: 9 %
Neutro Abs: 5.5 10*3/uL (ref 1.4–6.5)
Neutrophils Relative %: 69 %
PLATELETS: 214 10*3/uL (ref 150–440)
RBC: 4.06 MIL/uL — AB (ref 4.40–5.90)
RDW: 13.5 % (ref 11.5–14.5)
WBC: 7.9 10*3/uL (ref 3.8–10.6)

## 2016-04-07 LAB — BASIC METABOLIC PANEL
Anion gap: 4 — ABNORMAL LOW (ref 5–15)
BUN: 13 mg/dL (ref 6–20)
CALCIUM: 8.8 mg/dL — AB (ref 8.9–10.3)
CO2: 31 mmol/L (ref 22–32)
CREATININE: 0.97 mg/dL (ref 0.61–1.24)
Chloride: 102 mmol/L (ref 101–111)
GFR calc Af Amer: >60 mL/min (ref >60)
GLUCOSE: 93 mg/dL (ref 65–99)
Potassium: 4 mmol/L (ref 3.5–5.1)
SODIUM: 137 mmol/L (ref 135–145)

## 2016-04-07 MED ORDER — MUPIROCIN 2 % EX OINT
1.0000 "application " | TOPICAL_OINTMENT | Freq: Two times a day (BID) | CUTANEOUS | Status: DC
  Filled 2016-04-07: qty 22

## 2016-04-07 MED ORDER — CHLORHEXIDINE GLUCONATE CLOTH 2 % EX PADS: 6.0000 | MEDICATED_PAD | Freq: Every day | CUTANEOUS | Status: DC

## 2016-04-07 MED ORDER — HYDROMORPHONE HCL 1 MG/ML IJ SOLN
0.5000 mg | Freq: Once | INTRAMUSCULAR | Status: AC
  Administered 2016-04-07: 0.5 mg via INTRAVENOUS
  Filled 2016-04-07: qty 1

## 2016-04-07 MED ORDER — ZOLPIDEM TARTRATE 5 MG PO TABS: 5.0000 mg | ORAL_TABLET | Freq: Every evening | ORAL | Status: DC | PRN

## 2016-04-07 MED ORDER — TRAMADOL HCL 50 MG PO TABS
100.0000 mg | ORAL_TABLET | Freq: Four times a day (QID) | ORAL | Status: DC | PRN
  Administered 2016-04-07 – 2016-04-09 (×3): 100 mg via ORAL
  Filled 2016-04-07 (×3): qty 2

## 2016-04-07 NOTE — Clinical Social Work Note (Addendum)
Clinical Social Work Assessment  Patient Details  Name: Ian Newman. MRN: [id number] Date of Birth: 07-Mar-1961  Date of referral:  04/07/16               Reason for consult:  Facility Placement                Permission sought to share information with:    Permission granted to share information::     Name::        Agency::     Relationship::     Contact Information:     Housing/Transportation Living arrangements for the past 2 months:  Single Family Home Source of Information:  Patient Patient Interpreter Needed:  None Criminal Activity/Legal Involvement Pertinent to Current Situation/Hospitalization:  No - Comment as needed Significant Relationships:  Spouse Lives with:  Spouse Do you feel safe going back to the place where you live?  Yes Need for family participation in patient care:  Yes (Comment)  Care giving concerns:  Patient lives in [place] with his wife Ian Newman.    Social Worker assessment / plan:  Holiday representative (CSW) received SNF consult. PT recommended SNF on post op day 0. CSW met with patient alone at bedside to discuss D/C plan. CSW introduced self and explained role of CSW department. Per patient he remembers CSW from attending joint class. CSW explained that PT is recommending SNF due to patient's limited mobility due to pain. Patient declined SNF and reported that he wants to go home with [facility]. Patient reported that his pain is much better today and he does not want to consider SNF. Patient reported that his wife is a bus driver for the school system and she will be gone a few hours in the morning and in the afternoon but will be able to stay with him. Patient reported that he feels comfortable going home and wants to discharge tomorrow. RN case manager aware of above. CSW will continue to follow and assist as needed.   Employment status:  Transport planner PT Recommendations:  [employer] / Referral to community resources:  Other (Comment Required) (Patient declined SNF and wants to go home with home health. )  Patient/Family's Response to care:  Patient declined SNF and reported that he is going home.   Patient/Family's Understanding of and Emotional Response to Diagnosis, Current Treatment, and Prognosis:  Patient was pleasant and thanked CSW for visit.   Emotional Assessment Appearance:  Appears stated age Attitude/Demeanor/Rapport:    Affect (typically observed):  Accepting, Adaptable, Pleasant Orientation:  Oriented to Self, Oriented to Place, Oriented to  Time, Oriented to Situation Alcohol / Substance use:  Not Applicable Psych involvement (Current and /or in the community):  No (Comment)  Discharge Needs  Concerns to be addressed:  Discharge Planning Concerns Readmission within the last 30 days:  No Current discharge risk:  Dependent with Mobility Barriers to Discharge:  Continued Medical Work up   [employer], Veronia Beets, LCSW 04/07/2016, 9:56 AM

## 2016-04-07 NOTE — Progress Notes (Addendum)
Patient had a total left knee replacement and was in pain when I visited him. He requested prayers for himself and his family which I offered.

## 2016-04-07 NOTE — Progress Notes (Signed)
Patient BP 173/109. Dr. Mikle Bosworth was notified. MD ordered Tramadol for pain.   Deri Fuelling, RN

## 2016-04-07 NOTE — Progress Notes (Signed)
Physical Therapy Treatment Patient Details Name: Ian Newman. MRN: [id number] DOB: 1961/04/27 Today's Date: 04/07/2016    History of Present Illness Pt is admitted for L TKR. Pt very limited secondary to pain during evaluation attempt. RN aware and on phone with MD to get more pain meds    PT Comments    Pt agreeable to PT. Left knee pain 4/10; increases to 7/10 with ambulation. Pt progressing bed mobility, transfers and ambulation with less assist required; however, mobility challenging. Pt mildly impulsive requiring cues to wait for assist and safety. Ambulation requires heavy cueing for sequence, rolling walker placement, and safety; heavy lean on rolling walker. Pt up in chair comfortably. Educated with performance on left knee flexion and extension stretching. Continue in afternoon session for range, strength, endurance, and safety to improve all functional mobility.   Follow Up Recommendations  SNF (pending progress)     Equipment Recommendations  Rolling walker with 5" wheels;3in1 (PT)    Recommendations for Other Services       Precautions / Restrictions Precautions Precautions: Fall;Knee Precaution Booklet Issued: No Restrictions Weight Bearing Restrictions: Yes LLE Weight Bearing: Weight bearing as tolerated    Mobility  Bed Mobility Overal bed mobility: Needs Assistance Bed Mobility: Supine to Sit     Supine to sit: Min assist     General bed mobility comments: Mildly impulsive, assist for LLE management  Transfers Overall transfer level: Needs assistance   Transfers: Sit to/from Stand Sit to Stand: Min assist         General transfer comment: Min A to steady with transition to rw and cues to use LLE as much as possible  Ambulation/Gait Ambulation/Gait assistance: Min assist Ambulation Distance (Feet): 55 Feet Assistive device: Rolling walker (2 wheeled) Gait Pattern/deviations: Step-to pattern;Antalgic;Decreased weight shift to left;Decreased  dorsiflexion - left;Decreased stance time - left;Decreased step length - right Gait velocity: decreased Gait velocity interpretation: <1.8 ft/sec, indicative of risk for recurrent falls General Gait Details: Heavy on rw. poor sequencing requiring heavy cues for 3 point pattern. Assist to steady.   Stairs            Wheelchair Mobility    Modified Rankin (Stroke Patients Only)       Balance Overall balance assessment: Needs assistance         Standing balance support: Bilateral upper extremity supported Standing balance-Leahy Scale: Fair                      Cognition Arousal/Alertness: Awake/alert Behavior During Therapy: WFL for tasks assessed/performed;Impulsive Overall Cognitive Status: Within Functional Limits for tasks assessed                      Exercises Other Exercises Other Exercises: Pt performance and education on L knee stretching in extension with gravity assist and active QS. Also education in L knee flexion stretching    General Comments        Pertinent Vitals/Pain Pain Assessment: 0-10 Pain Score: 4  (7 with ambulation) Pain Location: L knee Pain Descriptors / Indicators: Aching;Constant;Sharp Pain Intervention(s): Monitored during session;Premedicated before session;Repositioned;Ice applied    Home Living                      Prior Function            PT Goals (current goals can now be found in the care plan section) Progress towards PT goals: Progressing toward goals  Frequency    BID      PT Plan Current plan remains appropriate    Co-evaluation             End of Session Equipment Utilized During Treatment: Gait belt Activity Tolerance: No increased pain;Patient limited by fatigue Patient left: in chair;with call bell/phone within reach;with chair alarm set;Other (comment) (polar care in place)     Time: [id number] PT Time Calculation (min) (ACUTE ONLY): 29 min  Charges:  $Gait  Training: 8-22 mins $Therapeutic Exercise: 8-22 mins                    G Codes:      Larae Grooms 04/22/2016, 12:58 PM

## 2016-04-07 NOTE — Progress Notes (Signed)
Patient unable to use CPM for Knee, no pads available on floor.

## 2016-04-07 NOTE — Care Management Important Message (Signed)
Important Message  Patient Details  Name: Ian Newman. MRN: [id number] Date of Birth: 09-15-1960   Medicare Important Message Given:  Yes    Jolly Mango, RN 04/07/2016, 9:25 AM

## 2016-04-07 NOTE — Progress Notes (Signed)
Pt c/o pain. States he is not getting relief from medication ordered. Pt also wants sleep medication. Received order for pain medication and sleep aide

## 2016-04-07 NOTE — Anesthesia Postprocedure Evaluation (Signed)
Anesthesia Post Note  Patient: Ian Newman.  Procedure(s) Performed: Procedure(s) (LRB): TOTAL KNEE ARTHROPLASTY (Left)  Patient location during evaluation: Nursing Unit Anesthesia Type: Spinal Level of consciousness: awake, awake and alert and oriented Pain management: pain level controlled Vital Signs Assessment: post-procedure vital signs reviewed and stable Respiratory status: spontaneous breathing, nonlabored ventilation and respiratory function stable Cardiovascular status: stable Postop Assessment: no headache and no backache Anesthetic complications: no    Last Vitals:  Vitals:   04/07/16 0321 04/07/16 0356  BP: (!) 132/100 132/75  Pulse: 85 70  Resp: 19   Temp: 36.8 C     Last Pain:  Vitals:   04/07/16 0607  TempSrc:   PainSc: 4                  Ian Newman,  Aadit Hagood R
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
## 2016-04-07 NOTE — Progress Notes (Signed)
Physical Therapy Treatment Patient Details Name: Ian Newman. MRN: [id number] DOB: 1961-04-18 Today's Date: 04/07/2016    History of Present Illness Pt is admitted for L TKR. Pt very limited secondary to pain during evaluation attempt. RN aware and on phone with MD to get more pain meds    PT Comments    Pt has returned to bed after lunch; minimal time in chair. Pain currently 7/10 left knee. Due to poor extension in left knee actively and with function, knee immobilizer donned for ambulation. Prior to ambulation, pt performs stand quad set with knee immobilizer donned. Pt ambulation continues antalgic with heavy lean on rolling walker with decreased stride; pt does improve 3 point sequence without cueing in afternoon session. Pt fatigues easily. Encouraged up in chair; however, pt wishes back to bed. Min A for Left lower extremity management back to bed. Pt encouraged to continue quad sets within knee immobilizer and instructed to take immobilizer off later to work on left knee flexion. Pt would benefit from knee immobilizer use at night to improve knee extension with CPM machine continues unused. Continue PT to progress range, strength, endurance to improve all functional mobility.   Follow Up Recommendations  SNF (pending progress)     Equipment Recommendations  Rolling walker with 5" wheels;3in1 (PT)    Recommendations for Other Services       Precautions / Restrictions Precautions Precautions: Fall;Knee Precaution Booklet Issued: No Required Braces or Orthoses: Knee Immobilizer - Left (used with ambulation) Restrictions Weight Bearing Restrictions: Yes LLE Weight Bearing: Weight bearing as tolerated    Mobility  Bed Mobility Overal bed mobility: Needs Assistance Bed Mobility: Supine to Sit;Sit to Supine     Supine to sit: Modified independent (Device/Increase time) Sit to supine: Min assist   General bed mobility comments: Requires assist for LLE with sit to supine  with KI on  Transfers Overall transfer level: Needs assistance Equipment used: Rolling walker (2 wheeled) Transfers: Sit to/from Stand Sit to Stand: Min assist         General transfer comment: Mildly unsteady, KI on L. assist to steady stand and sit and control descent with sit  Ambulation/Gait Ambulation/Gait assistance: Min guard Ambulation Distance (Feet): 55 Feet Assistive device: Rolling walker (2 wheeled) Gait Pattern/deviations: Step-to pattern;Antalgic;Decreased step length - right;Decreased stance time - left;Decreased weight shift to left Gait velocity: decreased Gait velocity interpretation: <1.8 ft/sec, indicative of risk for recurrent falls General Gait Details: Continues heavy on rw. Improved stability on L stance with KI; continued to encourage QS for muscle retraining.    Stairs            Wheelchair Mobility    Modified Rankin (Stroke Patients Only)       Balance Overall balance assessment: Needs assistance         Standing balance support: Bilateral upper extremity supported Standing balance-Leahy Scale: Fair                      Cognition Arousal/Alertness: Awake/alert Behavior During Therapy: WFL for tasks assessed/performed;Impulsive Overall Cognitive Status: Within Functional Limits for tasks assessed                      Exercises Total Joint Exercises Goniometric ROM: -10 to 63 degrees Other Exercises Other Exercises: L QS in stand with weigth shift 2 sets of 10. 10x in supine as well all with KI donned.    General Comments  Pertinent Vitals/Pain Pain Assessment: 0-10 Pain Score: 7  Pain Location: L knee Pain Descriptors / Indicators: Constant;Aching;Sharp Pain Intervention(s): Premedicated before session;Monitored during session;Limited activity within patient's tolerance;Ice applied    Home Living                      Prior Function            PT Goals (current goals can now be found  in the care plan section) Progress towards PT goals: Progressing toward goals    Frequency    BID      PT Plan Current plan remains appropriate    Co-evaluation             End of Session Equipment Utilized During Treatment: Gait belt Activity Tolerance: Patient limited by pain;Patient limited by fatigue Patient left: Other (comment);in bed;with bed alarm set;with SCD's reapplied (polar care in place)     Time: [id number] PT Time Calculation (min) (ACUTE ONLY): 1345 min  Charges:  $Gait Training: 8-22 mins $Therapeutic Exercise: 8-22 mins                    G Codes:      Larae Grooms, PTA 04/07/2016, 2:04 PM

## 2016-04-08 LAB — BASIC METABOLIC PANEL
Anion gap: 7 (ref 5–15)
BUN: 9 mg/dL (ref 6–20)
CO2: 29 mmol/L (ref 22–32)
CREATININE: 0.9 mg/dL (ref 0.61–1.24)
Calcium: 8.6 mg/dL — ABNORMAL LOW (ref 8.9–10.3)
Chloride: 97 mmol/L — ABNORMAL LOW (ref 101–111)
GFR calc Af Amer: >60 mL/min (ref >60)
GLUCOSE: 119 mg/dL — AB (ref 65–99)
Potassium: 3.8 mmol/L (ref 3.5–5.1)
SODIUM: 133 mmol/L — AB (ref 135–145)

## 2016-04-08 MED ORDER — HYDRALAZINE HCL 20 MG/ML IJ SOLN
10.0000 mg | Freq: Four times a day (QID) | INTRAMUSCULAR | Status: DC | PRN
  Administered 2016-04-08: 10 mg via INTRAVENOUS
  Filled 2016-04-08: qty 1

## 2016-04-08 MED ORDER — OXYCODONE HCL 10 MG PO TABS
10.0000 mg | ORAL_TABLET | ORAL | 0 refills | Status: DC | PRN
Start: 2016-04-08 — End: 2016-05-15

## 2016-04-08 MED ORDER — OXYCODONE HCL 5 MG PO TABS
10.0000 mg | ORAL_TABLET | ORAL | Status: DC | PRN
  Administered 2016-04-08 – 2016-04-09 (×6): 15 mg via ORAL
  Filled 2016-04-08: qty 1
  Filled 2016-04-08 (×6): qty 3

## 2016-04-08 MED ORDER — TRAMADOL HCL 50 MG PO TABS: 50.0000 mg | ORAL_TABLET | Freq: Four times a day (QID) | ORAL | 0 refills | Status: DC | PRN

## 2016-04-08 MED ORDER — ATENOLOL 50 MG PO TABS
50.0000 mg | ORAL_TABLET | Freq: Every day | ORAL | Status: DC
  Administered 2016-04-08 – 2016-04-09 (×2): 50 mg via ORAL
  Filled 2016-04-08 (×2): qty 1

## 2016-04-08 NOTE — Progress Notes (Signed)
Physical Therapy Treatment Patient Details Name: Ian Newman. MRN: [id number] DOB: 13-Nov-1960 Today's Date: 04/08/2016    History of Present Illness Pt is admitted for L TKR. Pt very limited secondary to pain during evaluation attempt. RN aware and on phone with MD to get more pain meds    PT Comments    Pt is not OOB with PT as he is having very high BP's and informed nursing of same.  He is able to do exercises with assistance to LLE due to pain, which he reports is the source of his BP issues.  Meds are given to control both and worked with him and repositioned afterward.  Will continue acutely as tolerated and as BP allows.  Follow Up Recommendations  SNF     Equipment Recommendations  Rolling walker with 5" wheels    Recommendations for Other Services Rehab consult     Precautions / Restrictions Precautions Precautions: Fall;Knee Precaution Booklet Issued: No Restrictions Weight Bearing Restrictions: Yes LLE Weight Bearing: Weight bearing as tolerated    Mobility  Bed Mobility                  Transfers                    Ambulation/Gait                 Stairs            Wheelchair Mobility    Modified Rankin (Stroke Patients Only)       Balance                                    Cognition Arousal/Alertness: Awake/alert Behavior During Therapy: WFL for tasks assessed/performed;Impulsive Overall Cognitive Status: Within Functional Limits for tasks assessed                      Exercises Total Joint Exercises Ankle Circles/Pumps: AROM;Both;5 reps (holding stretch) Quad Sets: AROM;Both;15 reps Gluteal Sets: AROM;Both;15 reps Heel Slides: AROM;AAROM;Both;15 reps Hip ABduction/ADduction: AROM;AAROM;Both;15 reps Straight Leg Raises: AROM;AAROM;Both;15 reps    General Comments        Pertinent Vitals/Pain Pain Assessment: 0-10 Pain Score: 7  Pain Location: L kinee Pain Descriptors /  Indicators: Aching;Constant (better with ROM) Pain Intervention(s): Premedicated before session;Repositioned;Monitored during session;Other (comment);Ice applied (spoke with nursing pre and post visit)    Home Living                      Prior Function            PT Goals (current goals can now be found in the care plan section) Progress towards PT goals: Progressing toward goals    Frequency    7X/week      PT Plan Current plan remains appropriate    Co-evaluation             End of Session   Activity Tolerance: Patient limited by pain;Patient limited by fatigue;Other (comment) (Did not get OOB due to very high BP) Patient left: in bed;with call bell/phone within reach;with bed alarm set     Time: [id number] PT Time Calculation (min) (ACUTE ONLY): 32 min  Charges:  $Therapeutic Exercise: 23-37 mins                    G Codes:  Ramond Dial 04/08/2016, 12:20 PM  Mee Hives, PT MS Acute Rehab Dept. Number: [place] and [place]
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
## 2016-04-08 NOTE — Progress Notes (Signed)
Pt blood pressure continues to be elevated medicated for BP and pain will follow

## 2016-04-08 NOTE — Progress Notes (Signed)
Physical Therapy Treatment Patient Details Name: Ian Newman. MRN: [id number] DOB: 1961/02/13 Today's Date: 04/08/2016    History of Present Illness Pt is admitted for L TKR. Pt very limited secondary to pain during evaluation attempt. RN aware and on phone with MD to get more pain meds    PT Comments    Pt is up to walk with PT now managing with no KI.  He is very painful in AM but by PM was better from doing there ex in AM.  Had reduction in BP by getting up to walk and using BSC.  He is very calm this afternoon, motivated and talking about getting to leave to continue rehab.  Follow acutely until dc to SNF.  Follow Up Recommendations  SNF     Equipment Recommendations  Rolling walker with 5" wheels    Recommendations for Other Services Rehab consult     Precautions / Restrictions Precautions Precautions: Fall;Knee Precaution Booklet Issued: No Restrictions Weight Bearing Restrictions: Yes LLE Weight Bearing: Weight bearing as tolerated    Mobility  Bed Mobility Overal bed mobility: Needs Assistance Bed Mobility: Supine to Sit;Sit to Supine     Supine to sit: Min guard;Min assist Sit to supine: Min assist   General bed mobility comments: assisted LLE due to pain  Transfers Overall transfer level: Needs assistance Equipment used: Rolling walker (2 wheeled);1 person hand held assist Transfers: Sit to/from [facility] Sit to Stand: Min guard Stand pivot transfers: Min guard       General transfer comment: Pt jumped up with no KI and used BSC after which he transferred with no help to bed using bedrail  Ambulation/Gait Ambulation/Gait assistance: Min guard;Min assist Ambulation Distance (Feet): 150 Feet Assistive device: Rolling walker (2 wheeled) Gait Pattern/deviations: Step-through pattern;Trunk flexed;Wide base of support;Decreased weight shift to right Gait velocity: decreased Gait velocity interpretation: Below normal speed for  age/gender General Gait Details: can do SLR's with no immobilizer   Stairs            Wheelchair Mobility    Modified Rankin (Stroke Patients Only)       Balance Overall balance assessment: Needs assistance Sitting-balance support: Feet supported Sitting balance-Leahy Scale: Good   Postural control: Posterior lean Standing balance support: Bilateral upper extremity supported Standing balance-Leahy Scale: Fair                      Cognition Arousal/Alertness: Awake/alert Behavior During Therapy: WFL for tasks assessed/performed;Impulsive Overall Cognitive Status: Within Functional Limits for tasks assessed                      Exercises Total Joint Exercises Ankle Circles/Pumps: AROM;Both;5 reps (holding stretch) Quad Sets: AROM;Both;15 reps Gluteal Sets: AROM;Both;15 reps Heel Slides: AROM;AAROM;Both;15 reps Hip ABduction/ADduction: AROM;AAROM;Both;15 reps Straight Leg Raises: AROM;AAROM;Both;15 reps    General Comments        Pertinent Vitals/Pain Pain Assessment: 0-10 Pain Score: 6  Pain Location: L knee joint Pain Descriptors / Indicators: Aching;Operative site guarding Pain Intervention(s): Monitored during session;Premedicated before session;Repositioned;Ice applied;Patient requesting pain meds-RN notified    Home Living                      Prior Function            PT Goals (current goals can now be found in the care plan section) Progress towards PT goals: Progressing toward goals    Frequency  7X/week      PT Plan Current plan remains appropriate    Co-evaluation             End of Session Equipment Utilized During Treatment: Gait belt Activity Tolerance: Patient limited by fatigue;Patient limited by pain;Treatment limited secondary to medical complications (Comment) (After gait BP was 159/94.) Patient left: in bed;with call bell/phone within reach;with bed alarm set;with nursing/sitter in room      Time: 1319-1410 PT Time Calculation (min) (ACUTE ONLY): 51 min  Charges:  $Gait Training: 8-22 mins $Therapeutic Exercise: 23-37 mins $Therapeutic Activity: 23-37 mins                    G Codes:      Ramond Dial 04-30-2016, 3:59 PM    Mee Hives, PT MS Acute Rehab Dept. Number: [place] and [place]

## 2016-04-08 NOTE — Progress Notes (Signed)
PT Cancellation Note  Patient Details Name: Ian Newman. MRN: [id number] DOB: 01/09/1961   Cancelled Treatment:    Reason Eval/Treat Not Completed: Medical issues which prohibited therapy.  Extremely high systolic BP and will ck later.   Ramond Dial 04/08/2016, 8:34 AM    Mee Hives, PT MS Acute Rehab Dept. Number: [place] and [place]

## 2016-04-09 DIAGNOSIS — Z23 Encounter for immunization: Secondary | ICD-10-CM | POA: Diagnosis not present

## 2016-04-09 LAB — BASIC METABOLIC PANEL
Anion gap: 6 (ref 5–15)
BUN: 11 mg/dL (ref 6–20)
CO2: 30 mmol/L (ref 22–32)
CREATININE: 0.93 mg/dL (ref 0.61–1.24)
Calcium: 9.1 mg/dL (ref 8.9–10.3)
Chloride: 98 mmol/L — ABNORMAL LOW (ref 101–111)
GFR calc Af Amer: >60 mL/min (ref >60)
GLUCOSE: 111 mg/dL — AB (ref 65–99)
Potassium: 4.5 mmol/L (ref 3.5–5.1)
SODIUM: 134 mmol/L — AB (ref 135–145)

## 2016-04-09 MED ORDER — ATENOLOL 50 MG PO TABS: 50.0000 mg | ORAL_TABLET | Freq: Every day | ORAL | 0 refills | Status: DC

## 2016-04-09 NOTE — Care Management Note (Signed)
Case Management Note  Patient Details  Name: Ian Newman. MRN: [id number] Date of Birth: Mar 08, 1961  Subjective/Objective:        Discussed discharge planning with [name] today. He choice [facility] for PT and a referral was called to Ardeen Fillers at [place]. He has a RW and BSC from [facility] at bedside. Dr Roland Rack has ordered ASA as his anticoagulant. Discharge to home today.             Action/Plan:   Expected Discharge Date:                  Expected Discharge Plan:  [place]  In-House Referral:     Discharge planning Services  CM Consult  Post Acute Care Choice:  Durable Medical Equipment, Home Health Choice offered to:  Patient, Spouse  DME Arranged:  [facility] DME Agency:  [place]:  PT [place]:  [facility] (now [facility])  Status of Service:  In process, will continue to follow  If discussed at Long Length of Stay Meetings, dates discussed:    Additional Comments:  Narcissa Melder A, RN 04/09/2016, 9:40 AM

## 2016-04-09 NOTE — Discharge Instructions (Signed)
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
## 2016-04-09 NOTE — Progress Notes (Signed)
[place] at [facility]                                                                                                                                                                                            Patient Demographics   Ian Newman, is a 55 y.o. male, DOB - 1961/06/25, [id number]  Admit date - 04/06/2016   Admitting Physician Corky Mull, MD  Outpatient Primary MD for the patient is Coral Spikes, DO   LOS - 3  Subjective: Pt feeling better bp improved with pain control     Review of Systems:   CONSTITUTIONAL: No documented fever. No fatigue, weakness. No weight gain, no weight loss.  EYES: No blurry or double vision.  ENT: No tinnitus. No postnasal drip. No redness of the oropharynx.  RESPIRATORY: No cough, no wheeze, no hemoptysis. No dyspnea.  CARDIOVASCULAR: No chest pain. No orthopnea. No palpitations. No syncope.  GASTROINTESTINAL: No nausea, no vomiting or diarrhea. No abdominal pain. No melena or hematochezia.  GENITOURINARY: No dysuria or hematuria.  ENDOCRINE: No polyuria or nocturia. No heat or cold intolerance.  HEMATOLOGY: No anemia. No bruising. No bleeding.  INTEGUMENTARY: No rashes. No lesions.  MUSCULOSKELETAL: No arthritis. No swelling. No gout.  NEUROLOGIC: No numbness, tingling, or ataxia. No seizure-type activity.  PSYCHIATRIC: No anxiety. No insomnia. No ADD.    Vitals:   Vitals:   04/08/16 1525 04/08/16 2022 04/09/16 0433 04/09/16 0740  BP: (!) 167/88 (!) 163/96 (!) 153/75 (!) 157/86  Pulse: 88 75 74 77  Resp: 18 18 18 18   Temp: 98.6 F (37 C) 98.5 F (36.9 C) 98.8 F (37.1 C) 99 F (37.2 C)  TempSrc: Oral Oral Oral Oral  SpO2: 98% 98% 97% 99%  Weight:      Height:        Wt Readings from Last 3 Encounters:  04/06/16 254 lb (115.2 kg)  03/29/16 242 lb (109.8 kg)  03/16/16 240 lb (108.9 kg)     Intake/Output Summary (Last 24 hours) at 04/09/16 1259 Last data filed at 04/09/16 0950   Gross per 24 hour  Intake              960 ml  Output             2025 ml  Net            -1065 ml    Physical Exam:   GENERAL: Pleasant-appearing in no apparent distress.  HEAD, EYES, EARS, NOSE AND THROAT: Atraumatic, normocephalic. Extraocular muscles are intact. Pupils equal and reactive to light. Sclerae anicteric. No  conjunctival injection. No oro-pharyngeal erythema.  NECK: Supple. There is no jugular venous distention. No bruits, no lymphadenopathy, no thyromegaly.  HEART: Regular rate and rhythm,. No murmurs, no rubs, no clicks.  LUNGS: Clear to auscultation bilaterally. No rales or rhonchi. No wheezes.  ABDOMEN: Soft, flat, nontender, nondistended. Has good bowel sounds. No hepatosplenomegaly appreciated.  EXTREMITIES: No evidence of any cyanosis, clubbing, or peripheral edema.  +2 pedal and radial pulses bilaterally.  NEUROLOGIC: The patient is alert, awake, and oriented x3 with no focal motor or sensory deficits appreciated bilaterally.  SKIN: Moist and warm with no rashes appreciated.  Psych: Not anxious, depressed LN: No inguinal LN enlargement    Antibiotics   Anti-infectives    Start     Dose/Rate Route Frequency Ordered Stop   04/06/16 1300  ceFAZolin (ANCEF) IVPB 2g/100 mL premix     2 g 200 mL/hr over 30 Minutes Intravenous Every 6 hours 04/06/16 1134 04/07/16 0023   04/06/16 0559  ceFAZolin (ANCEF) 2-4 GM/100ML-% IVPB    Comments:  Idamae Lusher: cabinet override      04/06/16 0559 04/06/16 0748   04/05/16 2245  ceFAZolin (ANCEF) IVPB 2g/100 mL premix     2 g 200 mL/hr over 30 Minutes Intravenous  Once 04/05/16 2232 04/06/16 0808      Medications   Scheduled Meds: . atenolol  50 mg Oral Daily  . docusate sodium  100 mg Oral BID  . enoxaparin (LOVENOX) injection  40 mg Subcutaneous Q24H  . pantoprazole  40 mg Oral Daily   Continuous Infusions:  PRN Meds:.acetaminophen **OR** acetaminophen, bisacodyl, diphenhydrAMINE, hydrALAZINE, HYDROmorphone  (DILAUDID) injection, magnesium hydroxide, metoCLOPramide **OR** metoCLOPramide (REGLAN) injection, ondansetron **OR** ondansetron (ZOFRAN) IV, oxyCODONE, sodium phosphate, traMADol, zolpidem   Data Review:   Micro Results No results found for this or any previous visit (from the past 240 hour(s)).  Radiology Reports Dg Chest 2 View  Result Date: 03/29/2016 CLINICAL DATA:  55 year old male preoperative study for knee surgery. Former smoker. Initial encounter. EXAM: CHEST  2 VIEW COMPARISON:  12/03/2014 and earlier. FINDINGS: Mediastinal contours remain normal. Lung volumes are at the upper limits of normal. Mild diffuse increased interstitial markings in both lungs with only mild progression since 2007. There is a tiny 4 mm lung nodule projecting over the anterior right fourth rib which I suspect may be a tiny calcified granuloma and appears stable since 2014, although no calcified granuloma was identified on a chest CT dated 06/12/2006. No pneumothorax, pulmonary edema, pleural effusion or other confluent pulmonary opacity. No acute osseous abnormality identified. IMPRESSION: 1. No acute cardiopulmonary abnormality. Chronic pulmonary interstitial changes likely the sequelae of smoking. 2. Small 4 mm right lung calcified granuloma suspected and appears stable since 2014. Electronically Signed   By: Genevie Ann M.D.   On: 03/29/2016 16:30   Mr Knee Left  Wo Contrast  Result Date: 03/13/2016 CLINICAL DATA:  Left knee pain and swelling with limited and painful range of motion. Remote history of infection in the knee. The remote injury. Osteoarthritis. EXAM: MRI OF THE LEFT KNEE WITHOUT CONTRAST TECHNIQUE: Multiplanar, multisequence MR imaging of the knee was performed. No intravenous contrast was administered. COMPARISON:  None. FINDINGS: MENISCI Medial meniscus: Complex tear posterior horn medial meniscus with radial and horizontal components ; horizontal is component primarily involves the superior  surface. Radial component is posteromedial and there is degenerative tearing of the midbody with a potential meniscal flap extending cephalad in the medial gutter. Lateral meniscus: Horizontal and superior surface  tear of the midbody and adjacent anterior and posterior horns. LIGAMENTS Cruciates:  Unremarkable Collaterals:  Mild MCL bursitis CARTILAGE Patellofemoral: Moderate to severe patellar chondral thinning with moderate chondral thinning in the femoral trochlear groove. Marginal spurring. Lateral subluxation of the patella. Medial: Prominent degenerative chondral thinning with mild chondral irregularity and small degenerative subcortical foci of marrow edema. Marginal spurring. Lateral: Variable chondral thinning with full-thickness loss of articular cartilage me laterally along the lateral femoral condyle for example on image 10 series 8, and otherwise mild chondral thinning. Marginal spurring with degenerative subcortical foci of edema along the lateral femoral condyle. Joint: Very large distended knee effusion. 9 mm linear osteochondral filling defect in the popliteus recess, image 3 series 8, compatible with free osteochondral fragment. Popliteal Fossa:  Unremarkable Extensor Mechanism: Tibial tubercle -trochlear groove distance 2.1 cm. This is abnormally elevated. Mild distal quadriceps tendinopathy. Edema tracks along the lateral patellar retinaculum. Bones: No significant extra-articular osseous abnormalities identified. Other: None IMPRESSION: 1. Complex tear posterior horn medial meniscus with radial and horizontal components, possible flap extending cephalad in the medial gutter. 2. Horizontal and superior surface tear of the midbody lateral meniscus. 3. Large distended knee effusion with a 9 mm tiny a osteochondral defect in the popliteus recess. 4. Moderate to severe osteoarthritis as detailed above. 5. Abnormally elevated tibial tubercle -trochlear groove distance at 2.1 cm. 6. Mild distal  quadriceps tendinopathy. 7. Mild MCL bursitis. 8. Cannot exclude grade 1 sprain of the lateral patellar retinaculum. Electronically Signed   By: Van Clines M.D.   On: 03/13/2016 14:35   Dg Knee Left Port  Result Date: 04/06/2016 CLINICAL DATA:  Total knee replacement. EXAM: PORTABLE LEFT KNEE - 1-2 VIEW COMPARISON:  03/13/2016. FINDINGS: Total left knee replacement. Anatomic alignment noted. Hardware intact. No acute bony abnormality . IMPRESSION: Total left knee replacement.  Anatomic alignment.  Hardware intact. Electronically Signed   By: Marcello Moores  Register   On: 04/06/2016 10:54     CBC  Recent Labs Lab 04/07/16 0614  WBC 7.9  HGB 13.3  HCT 38.4*  PLT 214  MCV 94.5  MCH 32.8  MCHC 34.7  RDW 13.5  LYMPHSABS 1.4  MONOABS 0.7  EOSABS 0.2  BASOSABS 0.1    Chemistries   Recent Labs Lab 04/07/16 0614 04/08/16 0426 04/09/16 0420  NA 137 133* 134*  K 4.0 3.8 4.5  CL 102 97* 98*  CO2 31 29 30   GLUCOSE 93 119* 111*  BUN 13 9 11   CREATININE 0.97 0.90 0.93  CALCIUM 8.8* 8.6* 9.1   ------------------------------------------------------------------------------------------------------------------ estimated creatinine clearance is 116.2 mL/min (by C-G formula based on SCr of 0.93 mg/dL). ------------------------------------------------------------------------------------------------------------------ No results for input(s): HGBA1C in the last 72 hours. ------------------------------------------------------------------------------------------------------------------ No results for input(s): CHOL, HDL, LDLCALC, TRIG, CHOLHDL, LDLDIRECT in the last 72 hours. ------------------------------------------------------------------------------------------------------------------ No results for input(s): TSH, T4TOTAL, T3FREE, THYROIDAB in the last 72 hours.  Invalid input(s):  FREET3 ------------------------------------------------------------------------------------------------------------------ No results for input(s): VITAMINB12, FOLATE, FERRITIN, TIBC, IRON, RETICCTPCT in the last 72 hours.  Coagulation profile No results for input(s): INR, PROTIME in the last 168 hours.  No results for input(s): DDIMER in the last 72 hours.  Cardiac Enzymes No results for input(s): CKMB, TROPONINI, MYOGLOBIN in the last 168 hours.  Invalid input(s): CK ------------------------------------------------------------------------------------------------------------------ Invalid input(s): POCBNP    Assessment & Plan  IMPRESSION AND PLAN: Patient is a 55 year old with knee surgery with elevated blood pressure   1. Elevated blood pressure with no history of high blood pressure  Due  to pain Now improved He reports that he is not wanting any blood pressure medications for home he will follow-up with his primary care provider Okay to be discharged from a medical standpoint  2. GERD continue Protonix         Code Status Orders        Start     Ordered   04/06/16 1135  Full code  Continuous     04/06/16 1134    Code Status History    Date Active Date Inactive Code Status Order ID Comments User Context   This patient has a current code status but no historical code status.                Lab Results  Component Value Date   PLT 214 04/07/2016     Time Spent in minutes   32 minutes  Greater than 50% of time spent in care coordination and counseling patient regarding the condition and plan of care.   Dustin Flock M.D on 04/09/2016 at 12:59 PM  Between 7am to 6pm - Pager - [phone]  After 6pm go to [email or web address] - password [email or web address] [facility]   Office  [phone]
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
## 2016-04-09 NOTE — Progress Notes (Signed)
Physical Therapy Treatment Patient Details Name: Ian Newman. MRN: [id number] DOB: 12-08-1960 Today's Date: 04/09/2016    History of Present Illness Pt is admitted for L TKR. Pt very limited secondary to pain during evaluation attempt. RN aware and on phone with MD to get more pain meds    PT Comments    Pt is up to walk with RW and on steps, demonstrating a readiness to leave as planned to SNF.  He is getting up with no brace on his own, but have cautioned him to use it as he can control the knee but cannot lift it without help yet.  He is verbalizing understanding and will be going to rehab after this admission to continue as ordered by MD.  Will see tomorrow if he is still here.  Follow Up Recommendations  SNF     Equipment Recommendations  Rolling walker with 5" wheels    Recommendations for Other Services Rehab consult     Precautions / Restrictions Precautions Precautions: Fall;Knee Precaution Booklet Issued: No Required Braces or Orthoses: Knee Immobilizer - Left Knee Immobilizer - Left: On when out of bed or walking Restrictions Weight Bearing Restrictions: Yes LLE Weight Bearing: Weight bearing as tolerated    Mobility  Bed Mobility Overal bed mobility: Needs Assistance Bed Mobility: Supine to Sit;Sit to Supine     Supine to sit: Supervision Sit to supine: Supervision   General bed mobility comments: LLE up with minor assist  Transfers Overall transfer level: Modified independent Equipment used: Rolling walker (2 wheeled) Transfers: Sit to/from [facility] Sit to Stand: Supervision Stand pivot transfers: Supervision       General transfer comment: pt is moving quickly and cautioned him to slow down  Ambulation/Gait Ambulation/Gait assistance: Supervision Ambulation Distance (Feet): 350 Feet Assistive device: Rolling walker (2 wheeled) Gait Pattern/deviations: Step-through pattern;Wide base of support;Decreased weight shift to  right Gait velocity: decreased Gait velocity interpretation: Below normal speed for age/gender     Stairs Stairs: Yes Stairs assistance: Min guard Stair Management: Two rails;One rail Right;Forwards Number of Stairs: 4 (x 2) General stair comments: good following of instructions  Wheelchair Mobility    Modified Rankin (Stroke Patients Only)       Balance Overall balance assessment: Needs assistance Sitting-balance support: Feet supported Sitting balance-Leahy Scale: Good   Postural control: Posterior lean Standing balance support: Bilateral upper extremity supported Standing balance-Leahy Scale: Fair                      Cognition Arousal/Alertness: Awake/alert Behavior During Therapy: WFL for tasks assessed/performed Overall Cognitive Status: Within Functional Limits for tasks assessed                      Exercises Total Joint Exercises Ankle Circles/Pumps: AROM;Both;5 reps Quad Sets: AROM;Both;15 reps Heel Slides: AROM;AAROM;Both;15 reps Hip ABduction/ADduction: AROM;AAROM;Both;15 reps    General Comments        Pertinent Vitals/Pain Pain Assessment: 0-10 Pain Score: 3  Pain Location: L knee Pain Descriptors / Indicators: Aching;Operative site guarding Pain Intervention(s): Monitored during session;Premedicated before session;Repositioned;Ice applied    Home Living                      Prior Function            PT Goals (current goals can now be found in the care plan section) Progress towards PT goals: Progressing toward goals    Frequency  7X/week      PT Plan Current plan remains appropriate    Co-evaluation             End of Session Equipment Utilized During Treatment: Gait belt Activity Tolerance: Patient tolerated treatment well;Patient limited by fatigue Patient left: in bed;with call bell/phone within reach     Time: [id number] PT Time Calculation (min) (ACUTE ONLY): 39 min  Charges:  $Gait  Training: 23-37 mins $Therapeutic Exercise: 8-22 mins                    G Codes:      Ramond Dial 06-May-2016, 1:27 PM    Mee Hives, PT MS Acute Rehab Dept. Number: [place] [facility] [place]

## 2016-04-09 NOTE — Progress Notes (Signed)
Patient discharge summary reviewed with verbal understanding. Rxs given at discharge. Belongings packed along with walker. VSS at this time. Escorted to personal vehicle by ortho nurse.

## 2016-04-10 ENCOUNTER — Telehealth: Payer: Self-pay

## 2016-04-10 ENCOUNTER — Encounter: Payer: Self-pay | Admitting: Surgery

## 2016-04-10 NOTE — Telephone Encounter (Addendum)
Transition Care Management Follow-up Telephone Call   Date discharged? 04/09/16   How have you been since you were released from the hospital? [place].  RESTING WELL.  APPETITE DECREASED. NUTRITIONAL DRINKS WHEN MEALS ARE SKIPPED.  DRINKING WITHOUT ISSUES. BM/VOIDING NO PROBLEMS.  HTN READINGS 140/88 WITHOUT MEDICATION.   Do you understand why you were in the hospital? YES, DJD, TEAR OF MENISCUS OF THE LEFT KNEE.   Do you understand the discharge instructions? YES, FOLLOW UP WITH PT/OT. INCREASE ACTIVITY AS TOLERATED. DRINK PLENTY OF FLUIDS.  FOLLOW UP WITH PCP REGARDING ELEVATING BLOOD PRESSURE.     Where were you discharged to? Home.   Items Reviewed:  Medications reviewed: YES, TAKING MEDICATIONS AS DIRECTED.  STOPPED TAKING ATENOLOL UNTIL EVALUATION WITH PCP.    Allergies reviewed: YES, PEPCID.   Dietary changes reviewed: YES, NO RESTRICTIONS.  Referrals reviewed: YES, FOLLOWED UP [facility] in 2 weeks for staple removal, AND PCP in 1 week.   Functional Questionnaire:   Activities of Daily Living (ADLs):   He states they are independent in the following:  States they require assistance with the following:    Any transportation issues/concerns?: YES, NEED TO MAKE AN APPOINTMENT ACCORDING TO WIFE'S WORK SCHEDULE.  MAY NEED TO BE MORE THAN 7 DAYS OUT.   Any patient concerns? NONE AT THIS TIME.   Confirmed importance and date/time of follow-up visits scheduled YES, tentatively scheduled 04/17/16 at 11:30.  Awaiting call back to confirm.  Provider Appointment to be booked with Dr. Lacinda Axon (PCP).  Confirmed with patient if condition begins to worsen call PCP or go to the ER.  Patient was given the office number and encouraged to call back with question or concerns.  : YES, patient verbalized understanding.

## 2016-04-11 ENCOUNTER — Telehealth: Payer: Self-pay

## 2016-04-11 DIAGNOSIS — E785 Hyperlipidemia, unspecified: Secondary | ICD-10-CM | POA: Diagnosis not present

## 2016-04-11 DIAGNOSIS — Z96652 Presence of left artificial knee joint: Secondary | ICD-10-CM | POA: Diagnosis not present

## 2016-04-11 DIAGNOSIS — R030 Elevated blood-pressure reading, without diagnosis of hypertension: Secondary | ICD-10-CM | POA: Diagnosis not present

## 2016-04-11 DIAGNOSIS — Z471 Aftercare following joint replacement surgery: Secondary | ICD-10-CM | POA: Diagnosis not present

## 2016-04-11 DIAGNOSIS — J449 Chronic obstructive pulmonary disease, unspecified: Secondary | ICD-10-CM | POA: Diagnosis not present

## 2016-04-11 DIAGNOSIS — Z79891 Long term (current) use of opiate analgesic: Secondary | ICD-10-CM | POA: Diagnosis not present

## 2016-04-11 DIAGNOSIS — Z87891 Personal history of nicotine dependence: Secondary | ICD-10-CM | POA: Diagnosis not present

## 2016-04-11 DIAGNOSIS — Z7982 Long term (current) use of aspirin: Secondary | ICD-10-CM | POA: Diagnosis not present

## 2016-04-11 NOTE — Telephone Encounter (Signed)
Followed up with patient to confirm appointment date regarding transitional care management hospital follow up.  States he has been monitoring his BP and he will call back to schedule an appointment within the next couple of weeks.  Last reading is 132/78.  He will keep a log and report by call or appointment.

## 2016-04-12 DIAGNOSIS — Z7982 Long term (current) use of aspirin: Secondary | ICD-10-CM | POA: Diagnosis not present

## 2016-04-12 DIAGNOSIS — Z471 Aftercare following joint replacement surgery: Secondary | ICD-10-CM | POA: Diagnosis not present

## 2016-04-12 DIAGNOSIS — R030 Elevated blood-pressure reading, without diagnosis of hypertension: Secondary | ICD-10-CM | POA: Diagnosis not present

## 2016-04-12 DIAGNOSIS — Z96652 Presence of left artificial knee joint: Secondary | ICD-10-CM | POA: Diagnosis not present

## 2016-04-12 DIAGNOSIS — Z79891 Long term (current) use of opiate analgesic: Secondary | ICD-10-CM | POA: Diagnosis not present

## 2016-04-12 DIAGNOSIS — E785 Hyperlipidemia, unspecified: Secondary | ICD-10-CM | POA: Diagnosis not present

## 2016-04-12 DIAGNOSIS — Z87891 Personal history of nicotine dependence: Secondary | ICD-10-CM | POA: Diagnosis not present

## 2016-04-12 DIAGNOSIS — J449 Chronic obstructive pulmonary disease, unspecified: Secondary | ICD-10-CM | POA: Diagnosis not present

## 2016-04-14 DIAGNOSIS — E785 Hyperlipidemia, unspecified: Secondary | ICD-10-CM | POA: Diagnosis not present

## 2016-04-14 DIAGNOSIS — Z471 Aftercare following joint replacement surgery: Secondary | ICD-10-CM | POA: Diagnosis not present

## 2016-04-14 DIAGNOSIS — Z7982 Long term (current) use of aspirin: Secondary | ICD-10-CM | POA: Diagnosis not present

## 2016-04-14 DIAGNOSIS — Z87891 Personal history of nicotine dependence: Secondary | ICD-10-CM | POA: Diagnosis not present

## 2016-04-14 DIAGNOSIS — Z96652 Presence of left artificial knee joint: Secondary | ICD-10-CM | POA: Diagnosis not present

## 2016-04-14 DIAGNOSIS — R030 Elevated blood-pressure reading, without diagnosis of hypertension: Secondary | ICD-10-CM | POA: Diagnosis not present

## 2016-04-14 DIAGNOSIS — Z79891 Long term (current) use of opiate analgesic: Secondary | ICD-10-CM | POA: Diagnosis not present

## 2016-04-14 DIAGNOSIS — J449 Chronic obstructive pulmonary disease, unspecified: Secondary | ICD-10-CM | POA: Diagnosis not present

## 2016-04-17 DIAGNOSIS — Z96652 Presence of left artificial knee joint: Secondary | ICD-10-CM | POA: Diagnosis not present

## 2016-04-25 DIAGNOSIS — Z96652 Presence of left artificial knee joint: Secondary | ICD-10-CM | POA: Diagnosis not present

## 2016-05-03 DIAGNOSIS — Z96652 Presence of left artificial knee joint: Secondary | ICD-10-CM | POA: Diagnosis not present

## 2016-05-05 DIAGNOSIS — Z96652 Presence of left artificial knee joint: Secondary | ICD-10-CM | POA: Diagnosis not present

## 2016-05-08 DIAGNOSIS — Z96652 Presence of left artificial knee joint: Secondary | ICD-10-CM | POA: Diagnosis not present

## 2016-05-10 ENCOUNTER — Emergency Department: Payer: Medicare Other

## 2016-05-10 ENCOUNTER — Emergency Department
Admission: EM | Admit: 2016-05-10 | Discharge: 2016-05-11 | Disposition: A | Discharge status: Home / Self Care | Payer: Medicare Other | Attending: Emergency Medicine | Admitting: Emergency Medicine

## 2016-05-10 ENCOUNTER — Encounter: Payer: Self-pay | Admitting: *Deleted

## 2016-05-10 DIAGNOSIS — R4020 Unspecified coma: Secondary | ICD-10-CM | POA: Diagnosis not present

## 2016-05-10 DIAGNOSIS — I519 Heart disease, unspecified: Secondary | ICD-10-CM | POA: Insufficient documentation

## 2016-05-10 DIAGNOSIS — R55 Syncope and collapse: Secondary | ICD-10-CM | POA: Diagnosis not present

## 2016-05-10 DIAGNOSIS — R4189 Other symptoms and signs involving cognitive functions and awareness: Secondary | ICD-10-CM

## 2016-05-10 DIAGNOSIS — E041 Nontoxic single thyroid nodule: Secondary | ICD-10-CM | POA: Insufficient documentation

## 2016-05-10 DIAGNOSIS — M7989 Other specified soft tissue disorders: Secondary | ICD-10-CM | POA: Diagnosis not present

## 2016-05-10 DIAGNOSIS — J449 Chronic obstructive pulmonary disease, unspecified: Secondary | ICD-10-CM | POA: Insufficient documentation

## 2016-05-10 DIAGNOSIS — Z791 Long term (current) use of non-steroidal anti-inflammatories (NSAID): Secondary | ICD-10-CM | POA: Diagnosis not present

## 2016-05-10 DIAGNOSIS — H539 Unspecified visual disturbance: Secondary | ICD-10-CM

## 2016-05-10 DIAGNOSIS — Z87891 Personal history of nicotine dependence: Secondary | ICD-10-CM | POA: Diagnosis not present

## 2016-05-10 DIAGNOSIS — I951 Orthostatic hypotension: Secondary | ICD-10-CM | POA: Diagnosis not present

## 2016-05-10 LAB — CBC
HEMATOCRIT: 43.4 % (ref 40.0–52.0)
HEMOGLOBIN: 14.9 g/dL (ref 13.0–18.0)
MCH: 31 pg (ref 26.0–34.0)
MCHC: 34.2 g/dL (ref 32.0–36.0)
MCV: 90.6 fL (ref 80.0–100.0)
Platelets: 307 10*3/uL (ref 150–440)
RBC: 4.78 MIL/uL (ref 4.40–5.90)
RDW: 13.2 % (ref 11.5–14.5)
WBC: 9 10*3/uL (ref 3.8–10.6)

## 2016-05-10 LAB — BASIC METABOLIC PANEL
ANION GAP: 13 (ref 5–15)
BUN: 15 mg/dL (ref 6–20)
CHLORIDE: 104 mmol/L (ref 101–111)
CO2: 23 mmol/L (ref 22–32)
CREATININE: 1.22 mg/dL (ref 0.61–1.24)
Calcium: 9.4 mg/dL (ref 8.9–10.3)
GFR calc non Af Amer: >60 mL/min (ref >60)
Glucose, Bld: 126 mg/dL — ABNORMAL HIGH (ref 65–99)
Potassium: 4 mmol/L (ref 3.5–5.1)
SODIUM: 140 mmol/L (ref 135–145)

## 2016-05-10 LAB — GLUCOSE, CAPILLARY: GLUCOSE-CAPILLARY: 127 mg/dL — AB (ref 65–99)

## 2016-05-10 LAB — TROPONIN I

## 2016-05-10 MED ORDER — SODIUM CHLORIDE 0.9 % IV BOLUS (SEPSIS): 1000.0000 mL | Freq: Once | INTRAVENOUS | Status: DC

## 2016-05-10 MED ORDER — IOPAMIDOL (ISOVUE-370) INJECTION 76%
75.0000 mL | Freq: Once | INTRAVENOUS | Status: AC | PRN
  Administered 2016-05-10: 75 mL via INTRAVENOUS

## 2016-05-10 MED ORDER — SODIUM CHLORIDE 0.9 % IV BOLUS (SEPSIS)
1000.0000 mL | Freq: Once | INTRAVENOUS | Status: AC
  Administered 2016-05-10: 1000 mL via INTRAVENOUS

## 2016-05-10 MED ORDER — OXYCODONE-ACETAMINOPHEN 5-325 MG PO TABS
2.0000 | ORAL_TABLET | Freq: Once | ORAL | Status: AC
  Administered 2016-05-10: 2 via ORAL
  Filled 2016-05-10: qty 2

## 2016-05-10 NOTE — ED Notes (Signed)
Per Dr. Mariea Clonts, give patient fluids to drink and if patient can keep down, then patient will not need 2nd liter of NS bolus. Provided patient with fluids.
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
## 2016-05-10 NOTE — Discharge Instructions (Signed)
Please drink plenty of fluid to stay well-hydrated. Please have your primary care physician recheck your vital signs, and reevaluate you for your symptoms.  Please let your primary care physician know that on your CT scan, a thyroid nodule was seen which will need further evaluation.  Turn to the emergency department if you develop lightheadedness or fainting, fever, severe pain, chest pain, or any other symptoms concerning to you.

## 2016-05-10 NOTE — ED Triage Notes (Signed)
Pt states he was watching tv tonight and had a near syncopal episode.  No chest pain or sob. No headache.  Pt states he had episode of slurred speech.  Pt feels lightheaded.  Pt alert  Speech clear.

## 2016-05-11 ENCOUNTER — Telehealth: Payer: Self-pay | Admitting: *Deleted

## 2016-05-11 NOTE — Telephone Encounter (Signed)
Last seen 02/18/16. Pt has a future appt on 05/25/2016. Please advise medication request?

## 2016-05-11 NOTE — Telephone Encounter (Signed)
Needs to discuss with surgeon (since it seems to be from pain).

## 2016-05-11 NOTE — Telephone Encounter (Signed)
Pt recently has a total knee replacement on 10/21, he hasn't been able to sleep since the surgery, due to aches. This hs caused him irritability, towards his family because of exhaustion  He stated that he would like to have something prescribed to help him sleep, he can not take [place] due to sleep walking.  Pt [facility]

## 2016-05-12 NOTE — Telephone Encounter (Signed)
He will need to be seen 

## 2016-05-12 NOTE — Telephone Encounter (Signed)
Pt called and stated that he already spoke with the surgeon and his surgeon said that pt has to speak with you regarding this. Pt is very upset and feels that he is getting the run around. Please advise, thank you!  Call pt [phone]

## 2016-05-12 NOTE — Telephone Encounter (Signed)
Pt was advised due to no openings in our schedule he could be seen at [facility] walk in clinic to have the issue addressed before his appointment.

## 2016-05-12 NOTE — Telephone Encounter (Signed)
Pt stated he spoke with surgeon and is doing everything they stated for him to do. Pt stated they prescribed 5 mg oxycodone but pt has to take 3 to get any relief. He is out of options and just wants to sleep

## 2016-05-15 ENCOUNTER — Ambulatory Visit (INDEPENDENT_AMBULATORY_CARE_PROVIDER_SITE_OTHER): Payer: Medicare Other | Admitting: Family Medicine

## 2016-05-15 ENCOUNTER — Encounter: Payer: Self-pay | Admitting: Family Medicine

## 2016-05-15 DIAGNOSIS — M25562 Pain in left knee: Secondary | ICD-10-CM | POA: Diagnosis not present

## 2016-05-15 MED ORDER — OXYCODONE HCL 10 MG PO TABS: 10.0000 mg | ORAL_TABLET | Freq: Four times a day (QID) | ORAL | 0 refills | Status: DC | PRN

## 2016-05-15 NOTE — Progress Notes (Signed)
Subjective:  Patient ID: Ian Newman., male    DOB: [birth year]  Age: 55 y.o. 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  Patient is being followed closely by his orthopedist. He continues to have severe pain in his left knee which significantly limits him and has been interfering with sleep. He is currently taking 5 mg of oxycodone as needed. He has been taking more than the prescribed amount due to poor control of pain. He states that he is discussed this with his orthopedist but he has recently been out of town making this problematic.  Patient states that his pain is severe. He describes it as a severe, dull ache. It interferes with sleep. Interferes with ambulation. He is participating in physical therapy. Exacerbated by activity. He has some mild relief but the pain medication. He would like to discuss this today.  Social Hx   Social History   Social History  . Marital status: Married    Spouse name: N/A  . Number of children: N/A  . Years of education: N/A   Social History Main Topics  . Smoking status: Former Smoker    Packs/day: 0.50    Quit date: 03/30/2015  . Smokeless tobacco: Never Used  . Alcohol use Yes     Comment: occasional  . Drug use:     Types: Marijuana  . Sexual activity: Not Asked   Other Topics Concern  . None   Social History Narrative  . None   Review of Systems  Musculoskeletal:       Left knee pain.  Psychiatric/Behavioral: Positive for sleep disturbance.   Objective:  BP (!) 146/90 (BP Location: Right Arm, Patient Position: Sitting, Cuff Size: Normal)   Pulse 98   Temp 98.2 F (36.8 C) (Oral)   Wt 232 lb 2 oz (105.3 kg)   SpO2 97%   BMI 31.48 kg/m   BP/Weight 05/15/2016 05/11/2016 0000000  Systolic BP 123456 Q000111Q -  Diastolic BP 90 84 -  Wt. (Lbs) 232.13 - 240  BMI 31.48 - 32.55   Physical Exam  Constitutional: He is  oriented to person, place, and time.  Appears in distress due to pain.  Pulmonary/Chest: Effort normal.  Musculoskeletal:  Left knee - midline scar, well healed. Swelling noted. Decreased ROM and pain with ROM.   Neurological: He is alert and oriented to person, place, and time.  Psychiatric: He has a normal mood and affect.  Vitals reviewed.   Lab Results  Component Value Date   WBC 9.0 05/10/2016   HGB 14.9 05/10/2016   HCT 43.4 05/10/2016   PLT 307 05/10/2016   GLUCOSE 126 (H) 05/10/2016   CHOL 235 (H) 02/18/2016   TRIG 170.0 (H) 02/18/2016   HDL 61.40 02/18/2016   LDLCALC 140 (H) 02/18/2016   ALT 25 02/18/2016   AST 26 02/18/2016   NA 140 05/10/2016   K 4.0 05/10/2016   CL 104 05/10/2016   CREATININE 1.22 05/10/2016   BUN 15 05/10/2016   CO2 23 05/10/2016   INR 0.94 03/29/2016   HGBA1C 5.3 02/18/2016    Assessment & Plan:   Problem List Items Addressed This Visit    Left knee pain    New problem (to me). Patient continues to have significant pain post operatively. Orthopedist has been out of town. Pain uncontrolled. Increasing Oxycodone to 10 mg QID PRN. Advised  that he needs to see Ortho to discuss. If pain does not improve, may need to see pain management. Additionally, I advised that from here on Rx's need to be from 1 physician (by ortho at this time).        Other Visit Diagnoses   None.     Meds ordered this encounter  Medications  . Oxycodone HCl 10 MG TABS    Sig: Take 1 tablet (10 mg total) by mouth 4 (four) times daily as needed.    Dispense:  60 tablet    Refill:  0    Follow-up: PRN  [place]

## 2016-05-15 NOTE — Patient Instructions (Signed)
Take the medication as needed.  Please follow up with ortho. You may need to see pain management if your pain does not improve.  Take care  Dr. Lacinda Axon

## 2016-05-15 NOTE — Progress Notes (Signed)
Pre visit review using our clinic review tool, if applicable. No additional management support is needed unless otherwise documented below in the visit note. 

## 2016-05-15 NOTE — Assessment & Plan Note (Signed)
New problem (to me). Patient continues to have significant pain post operatively. Orthopedist has been out of town. Pain uncontrolled. Increasing Oxycodone to 10 mg QID PRN. Advised that he needs to see Ortho to discuss. If pain does not improve, may need to see pain management. Additionally, I advised that from here on Rx's need to be from 1 physician (by ortho at this time).

## 2016-05-19 DIAGNOSIS — Z96652 Presence of left artificial knee joint: Secondary | ICD-10-CM | POA: Diagnosis not present

## 2016-05-25 ENCOUNTER — Ambulatory Visit: Payer: Self-pay | Admitting: Family Medicine

## 2016-06-05 ENCOUNTER — Encounter: Payer: Self-pay | Admitting: Family Medicine

## 2016-06-05 ENCOUNTER — Ambulatory Visit (INDEPENDENT_AMBULATORY_CARE_PROVIDER_SITE_OTHER): Payer: Medicare Other | Admitting: Family Medicine

## 2016-06-05 VITALS — BP 132/82 | HR 91 | Temp 97.9°F | Resp 16 | Wt 238.0 lb

## 2016-06-05 DIAGNOSIS — G479 Sleep disorder, unspecified: Secondary | ICD-10-CM | POA: Diagnosis not present

## 2016-06-05 DIAGNOSIS — M25562 Pain in left knee: Secondary | ICD-10-CM | POA: Diagnosis not present

## 2016-06-05 MED ORDER — MELOXICAM 15 MG PO TABS: 15.0000 mg | ORAL_TABLET | Freq: Every day | ORAL | 0 refills | Status: DC

## 2016-06-05 MED ORDER — TRAZODONE HCL 50 MG PO TABS: 50.0000 mg | ORAL_TABLET | Freq: Every evening | ORAL | 0 refills | Status: DC | PRN

## 2016-06-05 NOTE — Assessment & Plan Note (Signed)
Continues to persist. Wants to see another orthopedist. Will arrange. Treating with Mobic.

## 2016-06-05 NOTE — Assessment & Plan Note (Signed)
New problem. Secondary to knee pain.  No narcotics. Trial of Trazodone.

## 2016-06-05 NOTE — Patient Instructions (Signed)
Take the mobic daily as needed for your knee.  Use the trazadone at night.  We will call with your referral.  Follow up early next year.  Take care  Dr. Lacinda Axon

## 2016-06-05 NOTE — Progress Notes (Signed)
Subjective:  Patient ID: Ian Spotted., male    DOB: March 10, 1961  Age: 55 y.o. MRN: [id number]  CC: Follow up   HPI:  55 year old male with recent total knee replacement, HLD, GERD presents for follow up regarding knee pain.   Left knee pain  Continues to have severe knee pain (Left).  Worse at night and worse with activity. Interferes with sleep.   No longer on Narcotics.  Taking ibuprofen with little improvement.  Doing exercises at home. No more PT as could not afford.  Still has swelling.  Unhappy with care of orthopedist.   Social Hx   Social History   Social History  . Marital status: Married    Spouse name: N/A  . Number of children: N/A  . Years of education: N/A   Social History Main Topics  . Smoking status: Former Smoker    Packs/day: 0.50    Quit date: 03/30/2015  . Smokeless tobacco: Never Used  . Alcohol use Yes     Comment: occasional  . Drug use:     Types: Marijuana  . Sexual activity: Not Asked   Other Topics Concern  . None   Social History Narrative  . None    Review of Systems   Objective:  BP 132/82 (BP Location: Left Arm, Patient Position: Sitting, Cuff Size: Large)   Pulse 91   Temp 97.9 F (36.6 C) (Oral)   Resp 16   Wt 238 lb (108 kg)   SpO2 99%   BMI 32.28 kg/m   BP/Weight 06/05/2016 05/15/2016 [id number]  Systolic BP Q000111Q 123456 Q000111Q  Diastolic BP 82 90 84  Wt. (Lbs) 238 232.13 -  BMI 32.28 31.48 -   Physical Exam  Constitutional: He is oriented to person, place, and time. He appears well-developed. No distress.  Cardiovascular: Normal rate and regular rhythm.   Pulmonary/Chest: Effort normal and breath sounds normal.  Musculoskeletal:  Left knee - midline scar (well healed). Mild swelling noted.   Neurological: He is alert and oriented to person, place, and time.  Psychiatric: He has a normal mood and affect.  Vitals reviewed.   Lab Results  Component Value Date   WBC 9.0 05/10/2016   HGB 14.9 05/10/2016     HCT 43.4 05/10/2016   PLT 307 05/10/2016   GLUCOSE 126 (H) 05/10/2016   CHOL 235 (H) 02/18/2016   TRIG 170.0 (H) 02/18/2016   HDL 61.40 02/18/2016   LDLCALC 140 (H) 02/18/2016   ALT 25 02/18/2016   AST 26 02/18/2016   NA 140 05/10/2016   K 4.0 05/10/2016   CL 104 05/10/2016   CREATININE 1.22 05/10/2016   BUN 15 05/10/2016   CO2 23 05/10/2016   INR 0.94 03/29/2016   HGBA1C 5.3 02/18/2016    Assessment & Plan:   Problem List Items Addressed This Visit    Sleep disturbance    New problem. Secondary to knee pain.  No narcotics. Trial of Trazodone.      Left knee pain    Continues to persist. Wants to see another orthopedist. Will arrange. Treating with Mobic.         Meds ordered this encounter  Medications  . meloxicam (MOBIC) 15 MG tablet    Sig: Take 1 tablet (15 mg total) by mouth daily.    Dispense:  60 tablet    Refill:  0  . traZODone (DESYREL) 50 MG tablet    Sig: Take 1 tablet (50 mg total) by  mouth at bedtime as needed for sleep.    Dispense:  90 tablet    Refill:  0    Follow-up: Early 2018  Thersa Salt DO [facility]

## 2016-06-06 ENCOUNTER — Telehealth: Payer: Self-pay | Admitting: Family Medicine

## 2016-06-06 NOTE — Telephone Encounter (Signed)
Please advise.  Tried calling patient unable to reach him.

## 2016-06-06 NOTE — Telephone Encounter (Signed)
He has a history of ulcer (not current). He can take briefly (with Omeprazole to minimize risk). I have not other alternatives for him.

## 2016-06-06 NOTE — Telephone Encounter (Signed)
Patient called stating that the physician prescribed him meloxicam for his knee pain, but according to the warnings that comes with the prescription he should not take this medication if he has ulcers. The patient states that he does have ulcers and if there is something else he can take for his knee.

## 2016-06-07 ENCOUNTER — Other Ambulatory Visit: Payer: Self-pay | Admitting: Family Medicine

## 2016-06-07 DIAGNOSIS — Z96652 Presence of left artificial knee joint: Secondary | ICD-10-CM

## 2016-06-07 NOTE — Telephone Encounter (Signed)
Pt requested a call at [phone]

## 2016-06-07 NOTE — Telephone Encounter (Signed)
Order in. Will see when Ian Newman returns.

## 2016-06-07 NOTE — Telephone Encounter (Signed)
Patient advised of below  Verbalized an understanding of taking omeprazole.   Patient inquiring on status of referral to orthopedic.  I see in your note where a referral was to be placed.  I don't see where an order has been placed.

## 2016-06-07 NOTE — Telephone Encounter (Signed)
Left message to call on cell

## 2016-06-15 ENCOUNTER — Telehealth: Payer: Self-pay | Admitting: Family Medicine

## 2016-06-15 ENCOUNTER — Other Ambulatory Visit: Payer: Self-pay | Admitting: Family Medicine

## 2016-06-15 DIAGNOSIS — M25562 Pain in left knee: Principal | ICD-10-CM

## 2016-06-15 DIAGNOSIS — M25561 Pain in right knee: Secondary | ICD-10-CM | POA: Diagnosis not present

## 2016-06-15 DIAGNOSIS — G8929 Other chronic pain: Secondary | ICD-10-CM

## 2016-06-15 NOTE — Telephone Encounter (Signed)
Will proceed with referral to pain management.

## 2016-06-15 NOTE — Telephone Encounter (Signed)
Recommendations?

## 2016-06-15 NOTE — Telephone Encounter (Signed)
I can't continue to prescribe especially as this is a post operative issue. If he needs this he will need to see pain management.

## 2016-06-15 NOTE — Telephone Encounter (Signed)
Spoke with patient and stated that he can not take the Mobic due to stomach issues. He stated that he has been taking Tylenol with no relief. He is fine with the referral to pain management. He also stated that the ortho doctor drew blood to see if there was any infection.

## 2016-06-15 NOTE — Telephone Encounter (Signed)
Pt called about needing some pain medication. Pt went to the orthopedic today and he was told that pt pcp has to prescribe pain medication. Please advise?3  Pharmacy is [facility], [place]  Call pt @ [phone]. Thank you!

## 2016-07-07 DIAGNOSIS — G894 Chronic pain syndrome: Secondary | ICD-10-CM | POA: Diagnosis not present

## 2016-07-07 DIAGNOSIS — M25569 Pain in unspecified knee: Secondary | ICD-10-CM | POA: Diagnosis not present

## 2016-07-07 DIAGNOSIS — Z79899 Other long term (current) drug therapy: Secondary | ICD-10-CM | POA: Diagnosis not present

## 2016-07-07 DIAGNOSIS — M25539 Pain in unspecified wrist: Secondary | ICD-10-CM | POA: Diagnosis not present

## 2016-07-07 DIAGNOSIS — Z79891 Long term (current) use of opiate analgesic: Secondary | ICD-10-CM | POA: Diagnosis not present

## 2016-08-17 DIAGNOSIS — M25569 Pain in unspecified knee: Secondary | ICD-10-CM | POA: Diagnosis not present

## 2016-08-17 DIAGNOSIS — Z79891 Long term (current) use of opiate analgesic: Secondary | ICD-10-CM | POA: Diagnosis not present

## 2016-08-17 DIAGNOSIS — Z79899 Other long term (current) drug therapy: Secondary | ICD-10-CM | POA: Diagnosis not present

## 2016-08-17 DIAGNOSIS — G894 Chronic pain syndrome: Secondary | ICD-10-CM | POA: Diagnosis not present

## 2016-08-17 DIAGNOSIS — M25559 Pain in unspecified hip: Secondary | ICD-10-CM | POA: Diagnosis not present

## 2016-08-17 DIAGNOSIS — M25539 Pain in unspecified wrist: Secondary | ICD-10-CM | POA: Diagnosis not present

## 2016-09-13 ENCOUNTER — Encounter: Payer: Self-pay | Admitting: Family Medicine

## 2016-09-13 ENCOUNTER — Ambulatory Visit (INDEPENDENT_AMBULATORY_CARE_PROVIDER_SITE_OTHER): Payer: Medicare Other | Admitting: Family Medicine

## 2016-09-13 ENCOUNTER — Telehealth: Payer: Self-pay | Admitting: *Deleted

## 2016-09-13 DIAGNOSIS — J069 Acute upper respiratory infection, unspecified: Secondary | ICD-10-CM | POA: Diagnosis not present

## 2016-09-13 DIAGNOSIS — R030 Elevated blood-pressure reading, without diagnosis of hypertension: Secondary | ICD-10-CM

## 2016-09-13 MED ORDER — IPRATROPIUM BROMIDE 0.06 % NA SOLN: 2.0000 | Freq: Four times a day (QID) | NASAL | 1 refills | Status: DC

## 2016-09-13 MED ORDER — AMOXICILLIN-POT CLAVULANATE 875-125 MG PO TABS: 1.0000 | ORAL_TABLET | Freq: Two times a day (BID) | ORAL | 0 refills | Status: DC

## 2016-09-13 MED ORDER — LEVOCETIRIZINE DIHYDROCHLORIDE 5 MG PO TABS: 5.0000 mg | ORAL_TABLET | Freq: Every evening | ORAL | 1 refills | Status: DC

## 2016-09-13 NOTE — Assessment & Plan Note (Signed)
BP markedly elevated today (and on repeat). Patient to check BP at home. If remains elevated will meet criteria for HTN and will start treatment.

## 2016-09-13 NOTE — Progress Notes (Signed)
Subjective:  Patient ID: Ian Spotted., male    DOB: Apr 03, 1961  Age: 56 y.o. MRN: [id number]  CC: ? Sinus infection  HPI:  56 year old male presents with the above complaint.  Patient reports a 2 week history of runny nose, post nasal drip, congestion. He also notes sore throat and cough. No fever, chills. No purulent nasal discharge. He has been using flonase and OTC Allergy/Sinus medication with little improvement. No known exacerbating factors. No other concerns or complaints at this time.  Social Hx   Social History   Social History  . Marital status: Married    Spouse name: N/A  . Number of children: N/A  . Years of education: N/A   Social History Main Topics  . Smoking status: Former Smoker    Packs/day: 0.50    Quit date: 03/30/2015  . Smokeless tobacco: Never Used  . Alcohol use Yes     Comment: occasional  . Drug use: Yes    Types: Marijuana  . Sexual activity: Not Asked   Other Topics Concern  . None   Social History Narrative  . None    Review of Systems  Constitutional: Negative.   HENT: Positive for congestion, postnasal drip, rhinorrhea and sore throat.   Respiratory: Positive for cough.    Objective:  BP (!) 170/101 (BP Location: Left Arm, Cuff Size: Large)   Pulse 93   Temp 98 F (36.7 C) (Oral)   Wt 252 lb 9.6 oz (114.6 kg)   SpO2 98%   BMI 34.26 kg/m   BP/Weight 09/13/2016 06/05/2016 Q000111Q  Systolic BP 123XX123 Q000111Q 123456  Diastolic BP 99991111 82 90  Wt. (Lbs) 252.6 238 232.13  BMI 34.26 32.28 31.48    Physical Exam  Constitutional: He is oriented to person, place, and time. He appears well-developed. No distress.  HENT:  Head: Normocephalic and atraumatic.  Mouth/Throat: Oropharynx is clear and moist.  Cardiovascular: Normal rate and regular rhythm.   Pulmonary/Chest: Effort normal and breath sounds normal.  Neurological: He is alert and oriented to person, place, and time.  Psychiatric: He has a normal mood and affect.  Vitals  reviewed.   Lab Results  Component Value Date   WBC 9.0 05/10/2016   HGB 14.9 05/10/2016   HCT 43.4 05/10/2016   PLT 307 05/10/2016   GLUCOSE 126 (H) 05/10/2016   CHOL 235 (H) 02/18/2016   TRIG 170.0 (H) 02/18/2016   HDL 61.40 02/18/2016   LDLCALC 140 (H) 02/18/2016   ALT 25 02/18/2016   AST 26 02/18/2016   NA 140 05/10/2016   K 4.0 05/10/2016   CL 104 05/10/2016   CREATININE 1.22 05/10/2016   BUN 15 05/10/2016   CO2 23 05/10/2016   INR 0.94 03/29/2016   HGBA1C 5.3 02/18/2016    Assessment & Plan:   Problem List Items Addressed This Visit    URI (upper respiratory infection)    New acute problem. Appears either allergic or viral. Treating with Atrovent and Xyzal. If fails to improve, can start Augmentin (to cover for sinusitis)      Elevated BP without diagnosis of hypertension    BP markedly elevated today (and on repeat). Patient to check BP at home. If remains elevated will meet criteria for HTN and will start treatment.         Meds ordered this encounter  Medications  . ipratropium (ATROVENT) 0.06 % nasal spray    Sig: Place 2 sprays into both nostrils 4 (four)  times daily.    Dispense:  15 mL    Refill:  1  . levocetirizine (XYZAL) 5 MG tablet    Sig: Take 1 tablet (5 mg total) by mouth every evening.    Dispense:  30 tablet    Refill:  1  . amoxicillin-clavulanate (AUGMENTIN) 875-125 MG tablet    Sig: Take 1 tablet by mouth 2 (two) times daily.    Dispense:  20 tablet    Refill:  0    Follow-up: PRN  [place]

## 2016-09-13 NOTE — Telephone Encounter (Signed)
Needs treatment (has hypertension)

## 2016-09-13 NOTE — Assessment & Plan Note (Signed)
New acute problem. Appears either allergic or viral. Treating with Atrovent and Xyzal. If fails to improve, can start Augmentin (to cover for sinusitis)

## 2016-09-13 NOTE — Telephone Encounter (Signed)
Please advise 

## 2016-09-13 NOTE — Patient Instructions (Signed)
Hold off on the antibiotic.  Use the other meds as directed.  Take care  Dr. Lacinda Axon

## 2016-09-13 NOTE — Progress Notes (Signed)
Pre visit review using our clinic review tool, if applicable. No additional management support is needed unless otherwise documented below in the visit note. 

## 2016-09-13 NOTE — Telephone Encounter (Signed)
Pt was advised to call the office with a home blood pressure reading from today . 144/98

## 2016-09-14 ENCOUNTER — Other Ambulatory Visit: Payer: Self-pay | Admitting: Family Medicine

## 2016-09-14 MED ORDER — HYDROCHLOROTHIAZIDE 25 MG PO TABS: 25.0000 mg | ORAL_TABLET | Freq: Every day | ORAL | 3 refills | Status: DC

## 2016-09-14 NOTE — Telephone Encounter (Signed)
Pt called and stated that he was okay with starting medication for hypertension.

## 2016-10-16 ENCOUNTER — Telehealth: Payer: Self-pay | Admitting: Family Medicine

## 2016-10-16 NOTE — Telephone Encounter (Signed)
Left pt message asking to call Allison back directly at [phone] to schedule AWV. Thanks! °

## 2016-10-27 ENCOUNTER — Ambulatory Visit (INDEPENDENT_AMBULATORY_CARE_PROVIDER_SITE_OTHER): Payer: Medicare Other | Admitting: Family Medicine

## 2016-10-27 ENCOUNTER — Encounter: Payer: Self-pay | Admitting: Family Medicine

## 2016-10-27 DIAGNOSIS — M549 Dorsalgia, unspecified: Secondary | ICD-10-CM | POA: Diagnosis not present

## 2016-10-27 MED ORDER — DICLOFENAC SODIUM 75 MG PO TBEC: 75.0000 mg | DELAYED_RELEASE_TABLET | Freq: Two times a day (BID) | ORAL | 0 refills | Status: DC | PRN

## 2016-10-27 NOTE — Assessment & Plan Note (Signed)
New problem. Treating with diclofenac. Advised continued rest and heat.

## 2016-10-27 NOTE — Progress Notes (Signed)
Pre visit review using our clinic review tool, if applicable. No additional management support is needed unless otherwise documented below in the visit note. 

## 2016-10-27 NOTE — Patient Instructions (Addendum)
Heat, rest.  Diclofenac as needed.  Do not take motrin or aleve while on Diclofenac.  Take care  Dr. Lacinda Axon

## 2016-10-27 NOTE — Progress Notes (Signed)
   Subjective:  Patient ID: Ian Newman., male    DOB: [birth year]  Age: 56 y.o. MRN: [id number]  CC: Back pain  HPI:  56 year old male presents with the above complaint.  Patient suffered a fall 4 days ago. He was outside in tripped and fell backwards and landed on his left side/back. Since that time he's had significant left sided back pain. Located in the lower thoracic/upper lumbar region. Describes as dull and achy. Moderate in severity. Worse with activity and coughing. Improves with heat. He's also been taking Motrin. No radicular symptoms. No other complaints or concerns at this time.   Social Hx   Social History   Social History  . Marital status: Married    Spouse name: N/A  . Number of children: N/A  . Years of education: N/A   Social History Main Topics  . Smoking status: Former Smoker    Packs/day: 0.50    Quit date: 03/30/2015  . Smokeless tobacco: Never Used  . Alcohol use Yes     Comment: occasional  . Drug use: Yes    Types: Marijuana  . Sexual activity: Not Asked   Other Topics Concern  . None   Social History Narrative  . None   Review of Systems  Constitutional: Negative.   Musculoskeletal: Positive for back pain.   Objective:  BP (!) 142/88   Pulse 90   Temp 98.3 F (36.8 C) (Oral)   Wt 259 lb 6 oz (117.7 kg)   SpO2 95%   BMI 35.18 kg/m   BP/Weight 10/27/2016 09/13/2016 67/54/4920  Systolic BP 100 712 197  Diastolic BP 88 588 82  Wt. (Lbs) 259.38 252.6 238  BMI 35.18 34.26 32.28   Physical Exam  Constitutional: He appears well-developed. No distress.  Cardiovascular: Normal rate and regular rhythm.   Pulmonary/Chest: Effort normal and breath sounds normal.  Musculoskeletal:  Thoracic and lumbar spine - paraspinal musculature tenderness to palpation.  Psychiatric: He has a normal mood and affect.  Vitals reviewed.   Lab Results  Component Value Date   WBC 9.0 05/10/2016   HGB 14.9 05/10/2016   HCT 43.4 05/10/2016   PLT 307  05/10/2016   GLUCOSE 126 (H) 05/10/2016   CHOL 235 (H) 02/18/2016   TRIG 170.0 (H) 02/18/2016   HDL 61.40 02/18/2016   LDLCALC 140 (H) 02/18/2016   ALT 25 02/18/2016   AST 26 02/18/2016   NA 140 05/10/2016   K 4.0 05/10/2016   CL 104 05/10/2016   CREATININE 1.22 05/10/2016   BUN 15 05/10/2016   CO2 23 05/10/2016   INR 0.94 03/29/2016   HGBA1C 5.3 02/18/2016    Assessment & Plan:   Problem List Items Addressed This Visit    Musculoskeletal back pain    New problem. Treating with diclofenac. Advised continued rest and heat.      Relevant Medications   diclofenac (VOLTAREN) 75 MG EC tablet      Meds ordered this encounter  Medications  . diclofenac (VOLTAREN) 75 MG EC tablet    Sig: Take 1 tablet (75 mg total) by mouth 2 (two) times daily as needed.    Dispense:  30 tablet    Refill:  0     Follow-up: PRN  [place]

## 2016-11-16 NOTE — Telephone Encounter (Signed)
Left pt message asking to call Allison back directly at [phone] to schedule AWV. Thanks! °

## 2017-05-23 DIAGNOSIS — M1711 Unilateral primary osteoarthritis, right knee: Secondary | ICD-10-CM | POA: Diagnosis not present

## 2017-05-24 ENCOUNTER — Other Ambulatory Visit: Payer: Self-pay | Admitting: Orthopedic Surgery

## 2017-06-11 ENCOUNTER — Other Ambulatory Visit: Payer: Self-pay | Admitting: Orthopedic Surgery

## 2017-06-12 DIAGNOSIS — Z0000 Encounter for general adult medical examination without abnormal findings: Secondary | ICD-10-CM | POA: Diagnosis not present

## 2017-06-12 DIAGNOSIS — Z1159 Encounter for screening for other viral diseases: Secondary | ICD-10-CM | POA: Diagnosis not present

## 2017-06-12 DIAGNOSIS — Z125 Encounter for screening for malignant neoplasm of prostate: Secondary | ICD-10-CM | POA: Diagnosis not present

## 2017-06-12 DIAGNOSIS — I10 Essential (primary) hypertension: Secondary | ICD-10-CM | POA: Diagnosis not present

## 2017-07-19 DIAGNOSIS — M25661 Stiffness of right knee, not elsewhere classified: Secondary | ICD-10-CM | POA: Diagnosis not present

## 2017-07-19 DIAGNOSIS — M25561 Pain in right knee: Secondary | ICD-10-CM | POA: Diagnosis not present

## 2017-07-19 DIAGNOSIS — M1711 Unilateral primary osteoarthritis, right knee: Secondary | ICD-10-CM | POA: Diagnosis not present

## 2017-07-25 ENCOUNTER — Inpatient Hospital Stay: Admission: RE | Admit: 2017-07-25 | Payer: Self-pay | Source: Ambulatory Visit

## 2017-07-27 ENCOUNTER — Other Ambulatory Visit: Payer: Self-pay

## 2017-07-27 ENCOUNTER — Ambulatory Visit
Admission: RE | Admit: 2017-07-27 | Discharge: 2017-07-27 | Disposition: A | Discharge status: Home / Self Care | Payer: Medicare Other | Source: Ambulatory Visit | Attending: Orthopedic Surgery | Admitting: Orthopedic Surgery

## 2017-07-27 ENCOUNTER — Encounter
Admission: RE | Admit: 2017-07-27 | Discharge: 2017-07-27 | Disposition: A | Discharge status: Home / Self Care | Payer: Medicare Other | Source: Ambulatory Visit | Attending: Orthopedic Surgery | Admitting: Orthopedic Surgery

## 2017-07-27 DIAGNOSIS — I10 Essential (primary) hypertension: Secondary | ICD-10-CM | POA: Diagnosis not present

## 2017-07-27 DIAGNOSIS — Z01818 Encounter for other preprocedural examination: Secondary | ICD-10-CM | POA: Diagnosis not present

## 2017-07-27 DIAGNOSIS — Z01812 Encounter for preprocedural laboratory examination: Secondary | ICD-10-CM | POA: Insufficient documentation

## 2017-07-27 DIAGNOSIS — Z01811 Encounter for preprocedural respiratory examination: Secondary | ICD-10-CM

## 2017-07-27 DIAGNOSIS — Z01810 Encounter for preprocedural cardiovascular examination: Secondary | ICD-10-CM | POA: Insufficient documentation

## 2017-07-27 DIAGNOSIS — Z96659 Presence of unspecified artificial knee joint: Secondary | ICD-10-CM | POA: Diagnosis not present

## 2017-07-27 HISTORY — DX: Other allergic rhinitis: J30.89

## 2017-07-27 LAB — PROTIME-INR
INR: 0.84
Prothrombin Time: 11.4 seconds (ref 11.4–15.2)

## 2017-07-27 LAB — URINALYSIS, COMPLETE (UACMP) WITH MICROSCOPIC
BACTERIA UA: NONE SEEN
BILIRUBIN URINE: NEGATIVE
Glucose, UA: NEGATIVE mg/dL
KETONES UR: NEGATIVE mg/dL
LEUKOCYTES UA: NEGATIVE
Nitrite: NEGATIVE
PH: 5 (ref 5.0–8.0)
Protein, ur: NEGATIVE mg/dL
Specific Gravity, Urine: 1.019 (ref 1.005–1.030)

## 2017-07-27 LAB — COMPREHENSIVE METABOLIC PANEL
ALT: 28 U/L (ref 17–63)
ANION GAP: 8 (ref 5–15)
AST: 34 U/L (ref 15–41)
Albumin: 4.3 g/dL (ref 3.5–5.0)
Alkaline Phosphatase: 74 U/L (ref 38–126)
BILIRUBIN TOTAL: 0.8 mg/dL (ref 0.3–1.2)
BUN: 18 mg/dL (ref 6–20)
CO2: 29 mmol/L (ref 22–32)
Calcium: 9.6 mg/dL (ref 8.9–10.3)
Chloride: 100 mmol/L — ABNORMAL LOW (ref 101–111)
Creatinine, Ser: 1.11 mg/dL (ref 0.61–1.24)
GFR calc non Af Amer: >60 mL/min (ref >60)
Glucose, Bld: 100 mg/dL — ABNORMAL HIGH (ref 65–99)
POTASSIUM: 3.9 mmol/L (ref 3.5–5.1)
Sodium: 137 mmol/L (ref 135–145)
TOTAL PROTEIN: 7.4 g/dL (ref 6.5–8.1)

## 2017-07-27 LAB — HEMOGLOBIN A1C
HEMOGLOBIN A1C: 5.4 % (ref 4.8–5.6)
Mean Plasma Glucose: 108.28 mg/dL

## 2017-07-27 LAB — CBC WITH DIFFERENTIAL/PLATELET
BASOS PCT: 1 %
Basophils Absolute: 0.1 10*3/uL (ref 0–0.1)
EOS ABS: 0.4 10*3/uL (ref 0–0.7)
Eosinophils Relative: 7 %
HEMATOCRIT: 45 % (ref 40.0–52.0)
HEMOGLOBIN: 15.4 g/dL (ref 13.0–18.0)
Lymphocytes Relative: 31 %
Lymphs Abs: 1.9 10*3/uL (ref 1.0–3.6)
MCH: 32.2 pg (ref 26.0–34.0)
MCHC: 34.2 g/dL (ref 32.0–36.0)
MCV: 94.1 fL (ref 80.0–100.0)
MONOS PCT: 11 %
Monocytes Absolute: 0.7 10*3/uL (ref 0.2–1.0)
NEUTROS ABS: 3.2 10*3/uL (ref 1.4–6.5)
NEUTROS PCT: 50 %
Platelets: 268 10*3/uL (ref 150–440)
RBC: 4.78 MIL/uL (ref 4.40–5.90)
RDW: 14.2 % (ref 11.5–14.5)
WBC: 6.3 10*3/uL (ref 3.8–10.6)

## 2017-07-27 LAB — TYPE AND SCREEN
ABO/RH(D): A POS
ANTIBODY SCREEN: NEGATIVE

## 2017-07-27 LAB — SURGICAL PCR SCREEN
MRSA, PCR: NEGATIVE
Staphylococcus aureus: NEGATIVE

## 2017-07-27 LAB — APTT: APTT: 28 s (ref 24–36)

## 2017-07-27 MED ORDER — CHLORHEXIDINE GLUCONATE CLOTH 2 % EX PADS
6.0000 | MEDICATED_PAD | Freq: Once | CUTANEOUS | Status: DC
  Filled 2017-07-27: qty 6

## 2017-07-27 NOTE — Patient Instructions (Signed)
Your procedure is scheduled on: 08/09/17 Thurs Report to Same Day Surgery 2nd floor medical mall [facility] Entrance-take elevator on left to 2nd floor.  Check in with surgery information desk.) To find out your arrival time please call [phone] between 1PM - 3PM on 08/08/17 Wed  Remember: Instructions that are not followed completely may result in serious medical risk, up to and including death, or upon the discretion of your surgeon and anesthesiologist your surgery may need to be rescheduled.    _x___ 1. Do not eat food after midnight the night before your procedure. You may drink clear liquids up to 2 hours before you are scheduled to arrive at the hospital for your procedure.  Do not drink clear liquids within 2 hours of your scheduled arrival to the hospital.  Clear liquids include  --Water or Apple juice without pulp  --Clear carbohydrate beverage such as ClearFast or Gatorade  --Black Coffee or Clear Tea (No milk, no creamers, do not add anything to                  the coffee or Tea Type 1 and type 2 diabetics should only drink water.  No gum chewing or hard candies.     __x__ 2. No Alcohol for 24 hours before or after surgery.   __x__3. No Smoking for 24 prior to surgery.   ____  4. Bring all medications with you on the day of surgery if instructed.    __x__ 5. Notify your doctor if there is any change in your medical condition     (cold, fever, infections).     Do not wear jewelry, make-up, hairpins, clips or nail polish.  Do not wear lotions, powders, or perfumes. You may wear deodorant.  Do not shave 48 hours prior to surgery. Men may shave face and neck.  Do not bring valuables to the hospital.    [facility] is not responsible for any belongings or valuables.               Contacts, dentures or bridgework may not be worn into surgery.  Leave your suitcase in the car. After surgery it may be brought to your room.  For patients admitted to the hospital, discharge  time is determined by your                       treatment team.   Patients discharged the day of surgery will not be allowed to drive home.  You will need someone to drive you home and stay with you the night of your procedure.    Please read over the following fact sheets that you were given:   [facility] Preparing for Surgery and or MRSA Information   _x___ Take anti-hypertensive listed below, cardiac, seizure, asthma,     anti-reflux and psychiatric medicines. These include:  1. None  2.  3.  4.  5.  6.  ____Fleets enema or Magnesium Citrate as directed.   _x___ Use CHG Soap or sage wipes as directed on instruction sheet   ____ Use inhalers on the day of surgery and bring to hospital day of surgery  ____ Stop Metformin and Janumet 2 days prior to surgery.    ____ Take 1/2 of usual insulin dose the night before surgery and none on the morning     surgery.   _x___ Follow recommendations from Cardiologist, Pulmonologist or PCP regarding  stopping Aspirin, Coumadin, Plavix ,Eliquis, Effient, or Pradaxa, and Pletal.  X____Stop Anti-inflammatories such as Advil, Aleve, Ibuprofen, Motrin, Naproxen, Naprosyn, Goodies powders or aspirin products. OK to take Tylenol and                          Celebrex.   _x___ Stop supplements until after surgery.  But may continue Vitamin D, Vitamin B,       and multivitamin.   ____ Bring C-Pap to the hospital.

## 2017-08-08 MED ORDER — CLINDAMYCIN PHOSPHATE 600 MG/50ML IV SOLN: 600.0000 mg | Freq: Once | INTRAVENOUS | Status: DC

## 2017-08-08 MED ORDER — CEFAZOLIN SODIUM-DEXTROSE 2-4 GM/100ML-% IV SOLN: 2.0000 g | INTRAVENOUS | Status: DC

## 2017-08-09 ENCOUNTER — Ambulatory Visit: Payer: Medicare Other

## 2017-08-09 ENCOUNTER — Ambulatory Visit: Payer: Medicare Other | Admitting: Anesthesiology

## 2017-08-09 ENCOUNTER — Other Ambulatory Visit: Payer: Self-pay

## 2017-08-09 ENCOUNTER — Encounter
Admission: RE | Disposition: A | Discharge status: Home / Self Care | Payer: Self-pay | Source: Ambulatory Visit | Attending: Orthopedic Surgery

## 2017-08-09 ENCOUNTER — Inpatient Hospital Stay
Admission: RE | Admit: 2017-08-09 | Discharge: 2017-08-11 | DRG: 470 | Disposition: A | Discharge status: Home / Self Care | Payer: Medicare Other | Source: Ambulatory Visit | Attending: Orthopedic Surgery | Admitting: Orthopedic Surgery

## 2017-08-09 ENCOUNTER — Encounter: Payer: Self-pay | Admitting: *Deleted

## 2017-08-09 DIAGNOSIS — Z96651 Presence of right artificial knee joint: Secondary | ICD-10-CM

## 2017-08-09 DIAGNOSIS — K08409 Partial loss of teeth, unspecified cause, unspecified class: Secondary | ICD-10-CM | POA: Diagnosis not present

## 2017-08-09 DIAGNOSIS — Z96652 Presence of left artificial knee joint: Secondary | ICD-10-CM | POA: Diagnosis present

## 2017-08-09 DIAGNOSIS — Z888 Allergy status to other drugs, medicaments and biological substances status: Secondary | ICD-10-CM

## 2017-08-09 DIAGNOSIS — I739 Peripheral vascular disease, unspecified: Secondary | ICD-10-CM | POA: Diagnosis not present

## 2017-08-09 DIAGNOSIS — K0889 Other specified disorders of teeth and supporting structures: Secondary | ICD-10-CM | POA: Diagnosis present

## 2017-08-09 DIAGNOSIS — Z23 Encounter for immunization: Secondary | ICD-10-CM

## 2017-08-09 DIAGNOSIS — F17210 Nicotine dependence, cigarettes, uncomplicated: Secondary | ICD-10-CM | POA: Diagnosis present

## 2017-08-09 DIAGNOSIS — M25761 Osteophyte, right knee: Secondary | ICD-10-CM | POA: Diagnosis not present

## 2017-08-09 DIAGNOSIS — M1711 Unilateral primary osteoarthritis, right knee: Secondary | ICD-10-CM | POA: Diagnosis not present

## 2017-08-09 DIAGNOSIS — K219 Gastro-esophageal reflux disease without esophagitis: Secondary | ICD-10-CM | POA: Diagnosis present

## 2017-08-09 DIAGNOSIS — Z9049 Acquired absence of other specified parts of digestive tract: Secondary | ICD-10-CM | POA: Diagnosis not present

## 2017-08-09 DIAGNOSIS — Z8261 Family history of arthritis: Secondary | ICD-10-CM

## 2017-08-09 DIAGNOSIS — Z471 Aftercare following joint replacement surgery: Secondary | ICD-10-CM | POA: Diagnosis not present

## 2017-08-09 DIAGNOSIS — J449 Chronic obstructive pulmonary disease, unspecified: Secondary | ICD-10-CM | POA: Diagnosis not present

## 2017-08-09 DIAGNOSIS — I10 Essential (primary) hypertension: Secondary | ICD-10-CM | POA: Diagnosis not present

## 2017-08-09 HISTORY — PX: TOTAL KNEE ARTHROPLASTY: SHX125

## 2017-08-09 LAB — URINE DRUG SCREEN, QUALITATIVE (ARMC ONLY)
Amphetamines, Ur Screen: NOT DETECTED
BARBITURATES, UR SCREEN: NOT DETECTED
Benzodiazepine, Ur Scrn: NOT DETECTED
CANNABINOID 50 NG, UR ~~LOC~~: POSITIVE — AB
COCAINE METABOLITE, UR ~~LOC~~: NOT DETECTED
MDMA (Ecstasy)Ur Screen: NOT DETECTED
Methadone Scn, Ur: NOT DETECTED
Opiate, Ur Screen: NOT DETECTED
PHENCYCLIDINE (PCP) UR S: NOT DETECTED
TRICYCLIC, UR SCREEN: NOT DETECTED

## 2017-08-09 SURGERY — ARTHROPLASTY, KNEE, TOTAL
Anesthesia: Spinal | Laterality: Right

## 2017-08-09 MED ORDER — PHENYLEPHRINE HCL 10 MG/ML IJ SOLN
INTRAMUSCULAR | Status: AC
  Filled 2017-08-09: qty 1

## 2017-08-09 MED ORDER — KETOROLAC TROMETHAMINE 15 MG/ML IJ SOLN
15.0000 mg | Freq: Four times a day (QID) | INTRAMUSCULAR | Status: AC
  Administered 2017-08-09 – 2017-08-10 (×4): 15 mg via INTRAVENOUS
  Filled 2017-08-09 (×4): qty 1

## 2017-08-09 MED ORDER — DEXAMETHASONE SODIUM PHOSPHATE 10 MG/ML IJ SOLN
INTRAMUSCULAR | Status: DC | PRN
  Administered 2017-08-09: 10 mg via INTRAVENOUS

## 2017-08-09 MED ORDER — PHENYLEPHRINE HCL 10 MG/ML IJ SOLN
INTRAMUSCULAR | Status: DC | PRN
  Administered 2017-08-09: 50 ug/min via INTRAVENOUS

## 2017-08-09 MED ORDER — OXYCODONE HCL 5 MG PO TABS
15.0000 mg | ORAL_TABLET | ORAL | Status: DC | PRN
  Administered 2017-08-09 – 2017-08-11 (×9): 15 mg via ORAL
  Filled 2017-08-09 (×9): qty 3

## 2017-08-09 MED ORDER — TRANEXAMIC ACID 1000 MG/10ML IV SOLN
INTRAVENOUS | Status: DC | PRN
  Administered 2017-08-09: 1000 mg via INTRAVENOUS

## 2017-08-09 MED ORDER — PROPOFOL 10 MG/ML IV BOLUS
INTRAVENOUS | Status: AC
  Filled 2017-08-09: qty 20

## 2017-08-09 MED ORDER — CLINDAMYCIN PHOSPHATE 600 MG/50ML IV SOLN
600.0000 mg | Freq: Once | INTRAVENOUS | Status: AC
  Administered 2017-08-09: 600 mg via INTRAVENOUS

## 2017-08-09 MED ORDER — BUPIVACAINE HCL (PF) 0.5 % IJ SOLN
INTRAMUSCULAR | Status: DC | PRN
  Administered 2017-08-09: 3 mL

## 2017-08-09 MED ORDER — TRANEXAMIC ACID 1000 MG/10ML IV SOLN
INTRAVENOUS | Status: AC
  Filled 2017-08-09: qty 10

## 2017-08-09 MED ORDER — MIDAZOLAM HCL 2 MG/2ML IJ SOLN
INTRAMUSCULAR | Status: AC
  Filled 2017-08-09: qty 2

## 2017-08-09 MED ORDER — ALUM & MAG HYDROXIDE-SIMETH 200-200-20 MG/5ML PO SUSP: 30.0000 mL | ORAL | Status: DC | PRN

## 2017-08-09 MED ORDER — KETOROLAC TROMETHAMINE 15 MG/ML IJ SOLN
INTRAMUSCULAR | Status: AC
  Administered 2017-08-09: 15 mg via INTRAVENOUS
  Filled 2017-08-09: qty 1

## 2017-08-09 MED ORDER — SODIUM CHLORIDE 0.9 % IV SOLN
INTRAVENOUS | Status: DC | PRN
  Administered 2017-08-09: 100 mL

## 2017-08-09 MED ORDER — FENTANYL CITRATE (PF) 100 MCG/2ML IJ SOLN
INTRAMUSCULAR | Status: AC
  Filled 2017-08-09: qty 2

## 2017-08-09 MED ORDER — OXYCODONE HCL 5 MG PO TABS: 5.0000 mg | ORAL_TABLET | Freq: Once | ORAL | Status: DC | PRN

## 2017-08-09 MED ORDER — PHENOL 1.4 % MT LIQD: 1.0000 | OROMUCOSAL | Status: DC | PRN

## 2017-08-09 MED ORDER — CEFAZOLIN SODIUM-DEXTROSE 2-4 GM/100ML-% IV SOLN
INTRAVENOUS | Status: AC
  Filled 2017-08-09: qty 100

## 2017-08-09 MED ORDER — PROPOFOL 500 MG/50ML IV EMUL
INTRAVENOUS | Status: DC | PRN
  Administered 2017-08-09: 120 ug/kg/min via INTRAVENOUS

## 2017-08-09 MED ORDER — NEOMYCIN-POLYMYXIN B GU 40-200000 IR SOLN
Status: AC
  Filled 2017-08-09: qty 20

## 2017-08-09 MED ORDER — PNEUMOCOCCAL VAC POLYVALENT 25 MCG/0.5ML IJ INJ
0.5000 mL | INJECTION | INTRAMUSCULAR | Status: AC
  Administered 2017-08-10: 0.5 mL via INTRAMUSCULAR
  Filled 2017-08-09: qty 0.5

## 2017-08-09 MED ORDER — NEOMYCIN-POLYMYXIN B GU 40-200000 IR SOLN
Status: DC | PRN
  Administered 2017-08-09: 16 mL

## 2017-08-09 MED ORDER — ACETAMINOPHEN 500 MG PO TABS
1000.0000 mg | ORAL_TABLET | Freq: Four times a day (QID) | ORAL | Status: AC
  Administered 2017-08-09 – 2017-08-10 (×4): 1000 mg via ORAL
  Filled 2017-08-09 (×4): qty 2

## 2017-08-09 MED ORDER — DOCUSATE SODIUM 100 MG PO CAPS
100.0000 mg | ORAL_CAPSULE | Freq: Two times a day (BID) | ORAL | Status: DC
  Administered 2017-08-09 – 2017-08-10 (×3): 100 mg via ORAL
  Filled 2017-08-09 (×3): qty 1

## 2017-08-09 MED ORDER — CEFAZOLIN SODIUM-DEXTROSE 2-4 GM/100ML-% IV SOLN
2.0000 g | INTRAVENOUS | Status: AC
  Administered 2017-08-09: 2 g via INTRAVENOUS

## 2017-08-09 MED ORDER — POLYETHYLENE GLYCOL 3350 17 G PO PACK
17.0000 g | PACK | Freq: Every day | ORAL | Status: DC | PRN
  Administered 2017-08-10: 17 g via ORAL
  Filled 2017-08-09: qty 1

## 2017-08-09 MED ORDER — BUPIVACAINE LIPOSOME 1.3 % IJ SUSP
INTRAMUSCULAR | Status: AC
  Filled 2017-08-09: qty 20

## 2017-08-09 MED ORDER — PROPOFOL 500 MG/50ML IV EMUL
INTRAVENOUS | Status: AC
  Filled 2017-08-09: qty 50

## 2017-08-09 MED ORDER — ONDANSETRON HCL 4 MG/2ML IJ SOLN: 4.0000 mg | Freq: Four times a day (QID) | INTRAMUSCULAR | Status: DC | PRN

## 2017-08-09 MED ORDER — MAGNESIUM CITRATE PO SOLN
1.0000 | Freq: Once | ORAL | Status: AC | PRN
  Administered 2017-08-10: 1 via ORAL
  Filled 2017-08-09: qty 296

## 2017-08-09 MED ORDER — METHOCARBAMOL 500 MG PO TABS
500.0000 mg | ORAL_TABLET | Freq: Four times a day (QID) | ORAL | Status: DC | PRN
  Administered 2017-08-09: 500 mg via ORAL
  Filled 2017-08-09: qty 1

## 2017-08-09 MED ORDER — LIDOCAINE HCL (CARDIAC) 20 MG/ML IV SOLN
INTRAVENOUS | Status: DC | PRN
  Administered 2017-08-09: 100 mg via INTRAVENOUS

## 2017-08-09 MED ORDER — PHENYLEPHRINE HCL 10 MG/ML IJ SOLN
INTRAMUSCULAR | Status: DC | PRN
  Administered 2017-08-09 (×2): 100 ug via INTRAVENOUS

## 2017-08-09 MED ORDER — BUPIVACAINE HCL (PF) 0.5 % IJ SOLN
INTRAMUSCULAR | Status: AC
  Filled 2017-08-09: qty 10

## 2017-08-09 MED ORDER — ONDANSETRON HCL 4 MG/2ML IJ SOLN
INTRAMUSCULAR | Status: DC | PRN
  Administered 2017-08-09: 4 mg via INTRAVENOUS

## 2017-08-09 MED ORDER — MORPHINE SULFATE 4 MG/ML IJ SOLN
INTRAMUSCULAR | Status: DC | PRN
  Administered 2017-08-09: 32 mL via INTRA_ARTICULAR

## 2017-08-09 MED ORDER — METHOCARBAMOL 1000 MG/10ML IJ SOLN
500.0000 mg | Freq: Four times a day (QID) | INTRAVENOUS | Status: DC | PRN
  Filled 2017-08-09: qty 5

## 2017-08-09 MED ORDER — GABAPENTIN 300 MG PO CAPS
300.0000 mg | ORAL_CAPSULE | Freq: Three times a day (TID) | ORAL | Status: DC
  Administered 2017-08-09 – 2017-08-10 (×5): 300 mg via ORAL
  Filled 2017-08-09 (×5): qty 1

## 2017-08-09 MED ORDER — DIPHENHYDRAMINE HCL 12.5 MG/5ML PO ELIX: 12.5000 mg | ORAL_SOLUTION | ORAL | Status: DC | PRN

## 2017-08-09 MED ORDER — DEXAMETHASONE SODIUM PHOSPHATE 10 MG/ML IJ SOLN
INTRAMUSCULAR | Status: AC
  Filled 2017-08-09: qty 1

## 2017-08-09 MED ORDER — CELECOXIB 200 MG PO CAPS
200.0000 mg | ORAL_CAPSULE | Freq: Two times a day (BID) | ORAL | Status: DC
  Administered 2017-08-09 – 2017-08-10 (×3): 200 mg via ORAL
  Filled 2017-08-09 (×3): qty 1

## 2017-08-09 MED ORDER — MORPHINE SULFATE (PF) 4 MG/ML IV SOLN
INTRAVENOUS | Status: AC
  Filled 2017-08-09: qty 1

## 2017-08-09 MED ORDER — FENTANYL CITRATE (PF) 100 MCG/2ML IJ SOLN: 25.0000 ug | INTRAMUSCULAR | Status: DC | PRN

## 2017-08-09 MED ORDER — HYDROMORPHONE HCL 1 MG/ML IJ SOLN
1.0000 mg | INTRAMUSCULAR | Status: DC | PRN
  Administered 2017-08-09 – 2017-08-10 (×2): 1 mg via INTRAVENOUS
  Filled 2017-08-09 (×2): qty 1

## 2017-08-09 MED ORDER — ZOLPIDEM TARTRATE 5 MG PO TABS: 5.0000 mg | ORAL_TABLET | Freq: Every evening | ORAL | Status: DC | PRN

## 2017-08-09 MED ORDER — HYDROCHLOROTHIAZIDE 25 MG PO TABS
25.0000 mg | ORAL_TABLET | Freq: Every day | ORAL | Status: DC
  Administered 2017-08-10: 25 mg via ORAL
  Filled 2017-08-09: qty 1

## 2017-08-09 MED ORDER — ONDANSETRON HCL 4 MG PO TABS: 4.0000 mg | ORAL_TABLET | Freq: Four times a day (QID) | ORAL | Status: DC | PRN

## 2017-08-09 MED ORDER — METOCLOPRAMIDE HCL 10 MG PO TABS: 5.0000 mg | ORAL_TABLET | Freq: Three times a day (TID) | ORAL | Status: DC | PRN

## 2017-08-09 MED ORDER — ENOXAPARIN SODIUM 40 MG/0.4ML ~~LOC~~ SOLN
40.0000 mg | SUBCUTANEOUS | Status: DC
  Administered 2017-08-10: 40 mg via SUBCUTANEOUS
  Filled 2017-08-09: qty 0.4

## 2017-08-09 MED ORDER — SODIUM CHLORIDE 0.9 % IJ SOLN
INTRAMUSCULAR | Status: AC
  Filled 2017-08-09: qty 50

## 2017-08-09 MED ORDER — MIDAZOLAM HCL 5 MG/5ML IJ SOLN
INTRAMUSCULAR | Status: DC | PRN
  Administered 2017-08-09: 2 mg via INTRAVENOUS

## 2017-08-09 MED ORDER — LIDOCAINE HCL (PF) 2 % IJ SOLN
INTRAMUSCULAR | Status: AC
  Filled 2017-08-09: qty 10

## 2017-08-09 MED ORDER — OXYCODONE HCL 5 MG/5ML PO SOLN: 5.0000 mg | Freq: Once | ORAL | Status: DC | PRN

## 2017-08-09 MED ORDER — LACTATED RINGERS IV SOLN
INTRAVENOUS | Status: DC
  Administered 2017-08-09 (×2): via INTRAVENOUS

## 2017-08-09 MED ORDER — TRANEXAMIC ACID 1000 MG/10ML IV SOLN
1000.0000 mg | Freq: Once | INTRAVENOUS | Status: AC
  Administered 2017-08-09: 1000 mg via INTRAVENOUS
  Filled 2017-08-09 (×3): qty 10

## 2017-08-09 MED ORDER — PSEUDOEPHEDRINE HCL ER 120 MG PO TB12
120.0000 mg | ORAL_TABLET | Freq: Two times a day (BID) | ORAL | Status: DC
  Administered 2017-08-09 – 2017-08-10 (×3): 120 mg via ORAL
  Filled 2017-08-09 (×5): qty 1

## 2017-08-09 MED ORDER — OXYCODONE HCL 5 MG PO TABS
10.0000 mg | ORAL_TABLET | ORAL | Status: DC | PRN
  Administered 2017-08-09: 10 mg via ORAL
  Filled 2017-08-09 (×2): qty 2

## 2017-08-09 MED ORDER — BISACODYL 10 MG RE SUPP
10.0000 mg | Freq: Every day | RECTAL | Status: DC | PRN
  Administered 2017-08-10: 10 mg via RECTAL
  Filled 2017-08-09: qty 1

## 2017-08-09 MED ORDER — PROPOFOL 10 MG/ML IV BOLUS
INTRAVENOUS | Status: DC | PRN
  Administered 2017-08-09: 40 mg via INTRAVENOUS

## 2017-08-09 MED ORDER — METOCLOPRAMIDE HCL 5 MG/ML IJ SOLN: 5.0000 mg | Freq: Three times a day (TID) | INTRAMUSCULAR | Status: DC | PRN

## 2017-08-09 MED ORDER — BUPIVACAINE-EPINEPHRINE (PF) 0.25% -1:200000 IJ SOLN
INTRAMUSCULAR | Status: AC
  Filled 2017-08-09: qty 60

## 2017-08-09 MED ORDER — CEFAZOLIN SODIUM-DEXTROSE 2-4 GM/100ML-% IV SOLN
2.0000 g | Freq: Four times a day (QID) | INTRAVENOUS | Status: AC
  Administered 2017-08-09 (×2): 2 g via INTRAVENOUS
  Filled 2017-08-09 (×2): qty 100

## 2017-08-09 MED ORDER — CLINDAMYCIN PHOSPHATE 600 MG/50ML IV SOLN
INTRAVENOUS | Status: AC
  Filled 2017-08-09: qty 50

## 2017-08-09 MED ORDER — MENTHOL 3 MG MT LOZG: 1.0000 | LOZENGE | OROMUCOSAL | Status: DC | PRN

## 2017-08-09 SURGICAL SUPPLY — 66 items
AUTOTRANSFUS HAS 1/8 (MISCELLANEOUS)
BAG DECANTER FOR FLEXI CONT (MISCELLANEOUS) ×3 IMPLANT
BLADE SAW 1 (BLADE) ×5 IMPLANT
BLADE SAW 1/2 (BLADE) ×3 IMPLANT
CANISTER SUCT 1200ML W/VALVE (MISCELLANEOUS) ×3 IMPLANT
CANISTER SUCT 3000ML PPV (MISCELLANEOUS) ×6 IMPLANT
CAP KNEE TOTAL 3 SIGMA ×2 IMPLANT
CEMENT HV SMART SET (Cement) ×6 IMPLANT
CNTNR SPEC 2.5X3XGRAD LEK (MISCELLANEOUS) ×1
CONT SPEC 4OZ STER OR WHT (MISCELLANEOUS) ×2
CONT SPEC 4OZ STRL OR WHT (MISCELLANEOUS) ×1
CONTAINER SPEC 2.5X3XGRAD LEK (MISCELLANEOUS) ×1 IMPLANT
COOLER POLAR GLACIER W/PUMP (MISCELLANEOUS) ×3 IMPLANT
CUFF TOURN 24 STER (MISCELLANEOUS) IMPLANT
CUFF TOURN 30 STER DUAL PORT (MISCELLANEOUS) ×2 IMPLANT
DRAPE IMP U-DRAPE 54X76 (DRAPES) ×3 IMPLANT
DRAPE INCISE IOBAN 66X60 STRL (DRAPES) ×3 IMPLANT
DRAPE SHEET LG 3/4 BI-LAMINATE (DRAPES) ×6 IMPLANT
DRAPE SURG 17X11 SM STRL (DRAPES) ×6 IMPLANT
DRSG OPSITE POSTOP 4X12 (GAUZE/BANDAGES/DRESSINGS) ×3 IMPLANT
DRSG OPSITE POSTOP 4X14 (GAUZE/BANDAGES/DRESSINGS) ×3 IMPLANT
DURAPREP 26ML APPLICATOR (WOUND CARE) ×9 IMPLANT
ELECT REM PT RETURN 9FT ADLT (ELECTROSURGICAL) ×3
ELECTRODE REM PT RTRN 9FT ADLT (ELECTROSURGICAL) ×1 IMPLANT
GAUZE SPONGE 4X4 12PLY STRL (GAUZE/BANDAGES/DRESSINGS) ×3 IMPLANT
GLOVE BIOGEL PI IND STRL 9 (GLOVE) ×1 IMPLANT
GLOVE BIOGEL PI INDICATOR 9 (GLOVE) ×2
GLOVE SURG 9.0 ORTHO LTXF (GLOVE) ×6 IMPLANT
GOWN STRL REUS TWL 2XL XL LVL4 (GOWN DISPOSABLE) ×3 IMPLANT
GOWN STRL REUS W/ TWL LRG LVL3 (GOWN DISPOSABLE) ×1 IMPLANT
GOWN STRL REUS W/ TWL LRG LVL4 (GOWN DISPOSABLE) ×1 IMPLANT
GOWN STRL REUS W/TWL LRG LVL3 (GOWN DISPOSABLE) ×3
GOWN STRL REUS W/TWL LRG LVL4 (GOWN DISPOSABLE) ×3
HOLDER FOLEY CATH W/STRAP (MISCELLANEOUS) ×3 IMPLANT
IMMBOLIZER KNEE 19 BLUE UNIV (SOFTGOODS) ×3 IMPLANT
IV NS 100ML SINGLE PACK (IV SOLUTION) ×3 IMPLANT
KIT RM TURNOVER STRD PROC AR (KITS) ×3 IMPLANT
NDL SAFETY ECLIPSE 18X1.5 (NEEDLE) ×1 IMPLANT
NDL SPNL 20GX3.5 QUINCKE YW (NEEDLE) ×1 IMPLANT
NEEDLE HYPO 18GX1.5 SHARP (NEEDLE) ×3
NEEDLE HYPO 22GX1.5 SAFETY (NEEDLE) ×3 IMPLANT
NEEDLE SPNL 20GX3.5 QUINCKE YW (NEEDLE) ×3 IMPLANT
NS IRRIG 1000ML POUR BTL (IV SOLUTION) ×3 IMPLANT
PACK TOTAL KNEE (MISCELLANEOUS) ×3 IMPLANT
PAD PREP 24X41 OB/GYN DISP (PERSONAL CARE ITEMS) ×3 IMPLANT
PAD WRAPON POLAR KNEE (MISCELLANEOUS) ×1 IMPLANT
PULSAVAC PLUS IRRIG FAN TIP (DISPOSABLE) ×3
SOL .9 NS 3000ML IRR  AL (IV SOLUTION) ×2
SOL .9 NS 3000ML IRR AL (IV SOLUTION) ×1
SOL .9 NS 3000ML IRR UROMATIC (IV SOLUTION) ×1 IMPLANT
SPONGE DRAIN TRACH 4X4 STRL 2S (GAUZE/BANDAGES/DRESSINGS) ×3 IMPLANT
SPONGE LAP 18X18 5 PK (GAUZE/BANDAGES/DRESSINGS) IMPLANT
STAPLER SKIN PROX 35W (STAPLE) ×3 IMPLANT
SUCTION FRAZIER HANDLE 10FR (MISCELLANEOUS) ×2
SUCTION TUBE FRAZIER 10FR DISP (MISCELLANEOUS) ×1 IMPLANT
SUT ETHIBOND NAB CT1 #1 30IN (SUTURE) ×6 IMPLANT
SUT VIC AB 0 CT1 36 (SUTURE) ×3 IMPLANT
SUT VIC AB 2-0 CT1 (SUTURE) ×6 IMPLANT
SYR 20CC LL (SYRINGE) ×3 IMPLANT
SYR 30ML LL (SYRINGE) ×6 IMPLANT
SYSTEM AUTOTRANSFUS DUAL TROCR (MISCELLANEOUS) IMPLANT
TIP FAN IRRIG PULSAVAC PLUS (DISPOSABLE) ×1 IMPLANT
TOWER CARTRIDGE SMART MIX (DISPOSABLE) ×3 IMPLANT
TRAY FOLEY W/METER SILVER 16FR (SET/KITS/TRAYS/PACK) ×3 IMPLANT
TUBE SUCT KAM VAC (TUBING) ×3 IMPLANT
WRAPON POLAR PAD KNEE (MISCELLANEOUS) ×3

## 2017-08-09 NOTE — Evaluation (Signed)
Physical Therapy Evaluation Patient Details Name: Ian Newman. MRN: [id number] DOB: [birth year] Today's Date: 08/09/2017   History of Present Illness  57 y/o male s/p R TKA 08/09/17, had L knee replaced ~16 months ago  Clinical Impression  Pt was able to show good effort with PT exam, but struggled with pain t/o the session.  With great effort he was able to get to EOB and to standing w/o direct assist.  He was able to transfer bed to recliner with CGA but ultimately did not put more than toe-touch WBing through the R LE (KI donned) in standing and was hesitant.  Pt should be able to go home with HHPT on d/c if he improves as expected but was very pain limited today and further functional tasks will need to be executed tomorrow for this to be a safe option.     Follow Up Recommendations (per continued progress, pt hopes to go home w/ HHPT)    Equipment Recommendations       Recommendations for Other Services       Precautions / Restrictions        Mobility  Bed Mobility Overal bed mobility: Modified Independent             General bed mobility comments: heavy use of rails and some momentum, but able to rise w/o assist  Transfers Overall transfer level: Modified independent Equipment used: Rolling walker (2 wheeled)             General transfer comment: again heavy UE use, KI donned, pt able to rise w/o assist  Ambulation/Gait Ambulation/Gait assistance: Min guard Ambulation Distance (Feet): 3 Feet Assistive device: Rolling walker (2 wheeled)       General Gait Details: Pt able to take a few steps to get to recliner from bed but essentially put only TTWBing on the R and was clearly hurting and hesitant.    Stairs            Wheelchair Mobility    Modified Rankin (Stroke Patients Only)       Balance Overall balance assessment: Modified Independent                                           Pertinent Vitals/Pain Pain Assessment:  0-10 Pain Score: 8  Pain Location: R knee    Home Living Family/patient expects to be discharged to:: Private residence Living Arrangements: Spouse/significant other Available Help at Discharge: Family Type of Home: House Home Access: Stairs to enter Entrance Stairs-Rails: Left;Right(wide) Technical brewer of Steps: 2 Home Layout: One level Home Equipment: None      Prior Function Level of Independence: Independent         Comments: Pt apparently recovered well from L TKA, was able to walk w/o AD and take care of all he needed.     Hand Dominance        Extremity/Trunk Assessment   Upper Extremity Assessment Upper Extremity Assessment: Overall WFL for tasks assessed(R wrist essentially fused, otherwise WFL)    Lower Extremity Assessment Lower Extremity Assessment: Overall WFL for tasks assessed(except expected R knee limitations post-op)       Communication   Communication: No difficulties  Cognition Arousal/Alertness: Awake/alert Behavior During Therapy: WFL for tasks assessed/performed Overall Cognitive Status: Within Functional Limits for tasks assessed  General Comments      Exercises Total Joint Exercises Ankle Circles/Pumps: Strengthening;10 reps Quad Sets: Strengthening;10 reps Gluteal Sets: Strengthening;10 reps Short Arc Quad: AROM;10 reps Heel Slides: AROM;5 reps Hip ABduction/ADduction: Strengthening;10 reps Straight Leg Raises: AROM;5 reps(excessive straining, but able to lift w/o assist) Knee Flexion: PROM;5 reps Goniometric ROM: 2-68   Assessment/Plan    PT Assessment Patient needs continued PT services  PT Problem List Decreased strength;Decreased range of motion;Decreased activity tolerance;Decreased balance;Decreased mobility;Decreased coordination;Decreased knowledge of use of DME;Decreased safety awareness;Pain       PT Treatment Interventions DME instruction;Gait  training;Stair training;Therapeutic activities;Therapeutic exercise;Functional mobility training;Balance training;Neuromuscular re-education;Cognitive remediation;Patient/family education    PT Goals (Current goals can be found in the Care Plan section)  Acute Rehab PT Goals Patient Stated Goal: go home PT Goal Formulation: With patient Time For Goal Achievement: 08/23/17 Potential to Achieve Goals: Good    Frequency BID   Barriers to discharge        Co-evaluation               AM-PAC PT "6 Clicks" Daily Activity  Outcome Measure Difficulty turning over in bed (including adjusting bedclothes, sheets and blankets)?: A Little Difficulty moving from lying on back to sitting on the side of the bed? : A Lot Difficulty sitting down on and standing up from a chair with arms (e.g., wheelchair, bedside commode, etc,.)?: A Little Help needed moving to and from a bed to chair (including a wheelchair)?: A Little Help needed walking in hospital room?: A Lot Help needed climbing 3-5 steps with a railing? : Total 6 Click Score: 14    End of Session Equipment Utilized During Treatment: Gait belt Activity Tolerance: Patient limited by pain Patient left: with chair alarm set;with call bell/phone within reach Nurse Communication: Mobility status(wound draining) PT Visit Diagnosis: Muscle weakness (generalized) (M62.81);Difficulty in walking, not elsewhere classified (R26.2);Pain Pain - Right/Left: Right Pain - part of body: Knee    Time: 1625-1701 PT Time Calculation (min) (ACUTE ONLY): 36 min   Charges:   PT Evaluation $PT Eval Low Complexity: 1 Low PT Treatments $Therapeutic Exercise: 8-22 mins   PT G Codes:        Kreg Shropshire, DPT 08/09/2017, 5:16 PM

## 2017-08-09 NOTE — H&P (Signed)
The patient has been re-examined, and the chart reviewed, and there have been no interval changes to the documented history and physical.    The risks, benefits, and alternatives have been discussed at length, and the patient is willing to proceed.   

## 2017-08-09 NOTE — Op Note (Signed)
DATE OF SURGERY:  08/09/2017 TIME: 11:04 AM  PATIENT NAME:  Ian Newman.   AGE: 57 y.o.    PRE-OPERATIVE DIAGNOSIS:  Right knee osteoarthritis  POST-OPERATIVE DIAGNOSIS:  Same  PROCEDURE:  Procedure(s): RIGHT TOTAL KNEE ARTHROPLASTY  SURGEON:  Thornton Park, MD   ASSISTANT:  Wyatt Portela, PA  OPERATIVE IMPLANTS: Depuy PFC Sigma, Posterior Stabilized Femural component size 5, Tibia size rotating platform component size 5, Patella polyethylene 3-peg oval button size 38, with a 10 mm polyethylene insert.   PREOPERATIVE INDICATIONS:  Ian Newman. is an 57 y.o. male who has a diagnosis of right knee osteoarthritis and elected for a right total knee arthroplasty after failing nonoperative treatment who has significant impairment of his activities of daily living.  Radiographs have demonstrated tricompartmental osteoarthritis.  The risks, benefits, and alternatives were discussed at length including but not limited to the risks of infection, bleeding, nerve or blood vessel injury, knee stiffness, fracture, dislocation, loosening or failure of the hardware and the need for further surgery. Medical risks include but not limited to DVT and pulmonary embolism, myocardial infarction, stroke, pneumonia, respiratory failure and death. I discussed these risks with the patient in my office prior to the date of surgery. They understood these risks and were willing to proceed.   OPERATIVE DESCRIPTION:  The patient was brought to the operative room and placed in a supine position after undergoing placement of a spinal anesthetic.  A Foley catheter was placed.  IV antibiotics were given. Patient received ancef and clindamycin. The lower extremity was prepped and draped in the usual sterile fashion.  A time out was performed to verify the patient's name, date of birth, medical record number, correct site of surgery and correct procedure to be performed. TXA was given prior to the incision after  confirming with the patient that he has no history of stroke, myocardial infarction or DVT.  The timeout was also used to confirm the patient received antibiotics and that appropriate instruments, implants and radiographs studies were available in the room.  The leg was elevated and exsanguinated with an Esmarch and the tourniquet was inflated to 275 mmHg for 125 minutes..  A midline incision was made over the right knee. Full-thickness skin flaps were developed. A medial parapatellar arthrotomy was then made and the patella everted and the knee was brought into 90 of flexion. Hoffa's fat pad along with the cruciate ligaments and medial and lateral menisci were resected.   The distal femoral intramedullary canal was opened with a drill and the intramedullary distal femoral cutting jig was inserted into the femoral canal pinned into position. It was set at 5 degrees resecting 10 mm off the distal femur.  Care was taken to protect the collateral ligaments during distal femoral resection.  The distal femoral resection was performed with an oscillating saw. The femoral cutting guide was then removed.  The extramedullary tibial cutting guide was then placed using the anterior tibial crest and second ray of the foot as a references.  The tibial cutting guide was adjusted to allow for appropriate posterior slope.  The tibial cutting block was pinned into position. The slotted stylus was used to measure the proximal tibial resection of 10 mm off the high lateral side.  The tibial long rod alignment guide was then used to confirm position of the cutting block. A third cross pin through the tibial cutting block was then drilled into position to allow for rotational stability. Care was taken during  the tibial resection to protect the medial and collateral ligaments.  The resected tibial bone was removed along with the posterior horns of the menisci.  The PCL was sacrificed.  Extension gap was measured with a spacer  block and alignment and extension was confirmed using a long alignment rod.  The attention was then turned back to the femur. The posterior referencing distal femoral sizing guide was applied to the distal femur.  The femur was sized to be a 5. Rotation of the referencing guide was checked with the epicondylar axis and Whitesides line. Then the 4-in-1 cutting jig was then applied to the distal femur. A stylus was used to confirm that the anterior femur would not be notched.   Then the anterior, posterior and chamfer femoral cuts were then made with an oscillating saw.  All posterior osteophytes were removed.  The flexion gap was then measured with a flexion spacer block and long alignment rod and was found to be symmetric with the extension gap and perpendicular to mechanical axis of the tibia.  The distal femoral preparation was completed by performing the posterior stabilized box cut using the cutting block. The entry site for the intramedullary femoral guide was filled with autologous bone graft from bone previously resected earlier in the case.  The proximal tibia plateau was then sized with trial trays. The best coverage was achieved with a size 5. This tibial tray was then pinned into position. The proximal tibia was then prepared with the reamer and keel punch.  After tibial preparation was completed, all trial components were inserted with polyethylene trials.  The knee was found to have excellent balance and full motion with a size 10 mm tibial polyethylene insert..    The attention was then turned to preparation of the patella. The thickness of the patella was measured with a caliper, the diameter measured with the patella templates.  The patella resection was then made with an oscillating saw using the patella cutting guide.  3 peg holes for the patella component were then drilled. The trial patella was then placed. Knee was taken through a full range of motion and deemed to be stable with the  trial components. All trial components were then removed. The knee capsule was then injected with Exparel as well as a second injection of 0.25% marcaine, morphine and toradol x 60 cc. The joint was copiously irrigated with pulse lavage.  The final total knee arthroplasty components were then cemented into place with a 10 mm trial polyethylene insert and all excess methylmethacrylate was removed.  The joint was again copiously irrigated. After the cement had hardened the knee was again taken through a full range of motion. It was felt to be most stable with the 10 mm tibial polyethylene insert. The actual tibial polyethylene insert was then placed.   The knee was taken through a range of motion and the patella tracked well and the knee was again irrigated copiously.    The medial arthrotomy was closed with #1 Ethibond. The subcutaneous tissue closed with 0 and 2-0 vicryl, and skin approximated with staples.  A dry sterile and compressive dressing was applied.  A Polar Care was applied to the operative knee along with a knee immobilizer.  The patient was awakened and brought to the PACU in stable and satisfactory condition.  All sharp, lap and instrument counts were correct at the conclusion the case. I spoke with the patient's wife in the postop consultation room to let her know the  case had been performed without complication and the patient was stable in recovery room.    Total tourniquet time was 125 minutes.

## 2017-08-09 NOTE — Progress Notes (Signed)
Pt admitted from PACU via hospital bed without incident per MD order accompanied by wife. Rept received from Amy RN from PACU. Pt and wife oriented to room and unit. Spinal still intact. Pt can feel touch BLE but decreased. LLE pt able to lift off bed. RLE pt able to wiggle toes. Pt and wife oriented to fall precautions and bed alarm applied. Pt and wife instructed pain control regimen. Pt and wife verbalize understanding. Pt states, "I am kinda familiar with things. I recently had my L knee repaired here." Will continue to monitor.

## 2017-08-09 NOTE — NC FL2 (Signed)
[place] LEVEL OF CARE SCREENING TOOL     IDENTIFICATION  Patient Name: Ian Newman. Birthdate: June 01, 1961 Sex: male Admission Date (Current Location): 08/09/2017  [place] and [place] Number:  Engineering geologist and Address:  [facility], [address] [place], [place] [address]      Provider Number: [id number]  Attending Physician Name and Address:  Thornton Park, MD  Relative Name and Phone Number:       Current Level of Care: Hospital Recommended Level of Care: [place] Prior Approval Number:    Date Approved/Denied:   PASRR Number: ([id number])  Discharge Plan: SNF    Current Diagnoses: Patient Active Problem List   Diagnosis Date Noted  . S/P TKR (total knee replacement) using cement, right 08/09/2017  . Musculoskeletal back pain 10/27/2016  . Sleep disturbance 06/05/2016  . Status post total knee replacement using cement 04/06/2016  . Hyperlipidemia 02/18/2016  . Essential hypertension 02/18/2016  . Gastroesophageal reflux disease with esophagitis 12/07/2014    Orientation RESPIRATION BLADDER Height & Weight     Self, Time, Situation, Place  Normal Continent Weight: 265 lb (120.2 kg) Height:  6' (182.9 cm)  BEHAVIORAL SYMPTOMS/MOOD NEUROLOGICAL BOWEL NUTRITION STATUS      Continent Diet(Regular)  AMBULATORY STATUS COMMUNICATION OF NEEDS Skin   Extensive Assist Verbally Surgical wounds(Incision Right Knee)                       Personal Care Assistance Level of Assistance  Bathing, Feeding, Dressing Bathing Assistance: Limited assistance Feeding assistance: Independent Dressing Assistance: Limited assistance     Functional Limitations Info  Sight, Hearing, Speech Sight Info: Adequate Hearing Info: Adequate Speech Info: Adequate    SPECIAL CARE FACTORS FREQUENCY  PT (By licensed PT), OT (By licensed OT)     PT Frequency: (5) OT Frequency: (5)            Contractures       Additional Factors Info  Code Status, Allergies Code Status Info: (Full Code) Allergies Info: (PEPCID FAMOTIDINE )           Current Medications (08/09/2017):  This is the current hospital active medication list Current Facility-Administered Medications  Medication Dose Route Frequency Provider Last Rate Last Dose  . acetaminophen (TYLENOL) tablet 1,000 mg  1,000 mg Oral Q6H Thornton Park, MD   1,000 mg at 08/09/17 1323  . alum & mag hydroxide-simeth (MAALOX/MYLANTA) 200-200-20 MG/5ML suspension 30 mL  30 mL Oral Q4H PRN Thornton Park, MD      . bisacodyl (DULCOLAX) suppository 10 mg  10 mg Rectal Daily PRN Thornton Park, MD      . ceFAZolin (ANCEF) IVPB 2g/100 mL premix  2 g Intravenous Q6H Thornton Park, MD 200 mL/hr at 08/09/17 1323 2 g at 08/09/17 1323  . celecoxib (CELEBREX) capsule 200 mg  200 mg Oral Q12H Thornton Park, MD      . diphenhydrAMINE (BENADRYL) 12.5 MG/5ML elixir 12.5-25 mg  12.5-25 mg Oral Q4H PRN Thornton Park, MD      . docusate sodium (COLACE) capsule 100 mg  100 mg Oral BID Thornton Park, MD      . Derrill Memo ON 08/10/2017] enoxaparin (LOVENOX) injection 40 mg  40 mg Subcutaneous Q24H Thornton Park, MD      . gabapentin (NEURONTIN) capsule 300 mg  300 mg Oral TID Thornton Park, MD      . hydrochlorothiazide (HYDRODIURIL) tablet 25 mg  25 mg  Oral Daily Thornton Park, MD      . HYDROmorphone (DILAUDID) injection 1 mg  1 mg Intravenous Q2H PRN Thornton Park, MD      . ketorolac (TORADOL) 15 MG/ML injection 15 mg  15 mg Intravenous Q6H Thornton Park, MD   15 mg at 08/09/17 1141  . magnesium citrate solution 1 Bottle  1 Bottle Oral Once PRN Thornton Park, MD      . menthol-cetylpyridinium (CEPACOL) lozenge 3 mg  1 lozenge Oral PRN Thornton Park, MD       Or  . phenol (CHLORASEPTIC) mouth spray 1 spray  1 spray Mouth/Throat PRN Thornton Park, MD      . methocarbamol (ROBAXIN) tablet 500 mg  500 mg Oral Q6H PRN Thornton Park, MD   500 mg at 08/09/17 1324   Or  . methocarbamol (ROBAXIN) 500 mg in dextrose 5 % 50 mL IVPB  500 mg Intravenous Q6H PRN Thornton Park, MD      . metoCLOPramide (REGLAN) tablet 5-10 mg  5-10 mg Oral Q8H PRN Thornton Park, MD       Or  . metoCLOPramide (REGLAN) injection 5-10 mg  5-10 mg Intravenous Q8H PRN Thornton Park, MD      . ondansetron [facility]) tablet 4 mg  4 mg Oral Q6H PRN Thornton Park, MD       Or  . ondansetron [facility]) injection 4 mg  4 mg Intravenous Q6H PRN Thornton Park, MD      . oxyCODONE (Oxy IR/ROXICODONE) immediate release tablet 10 mg  10 mg Oral Q3H PRN Thornton Park, MD   10 mg at 08/09/17 1216  . oxyCODONE (Oxy IR/ROXICODONE) immediate release tablet 15 mg  15 mg Oral Q3H PRN Thornton Park, MD      . Derrill Memo ON 08/10/2017] pneumococcal 23 valent vaccine (PNU-IMMUNE) injection 0.5 mL  0.5 mL Intramuscular Tomorrow-1000 Thornton Park, MD      . polyethylene glycol (MIRALAX / GLYCOLAX) packet 17 g  17 g Oral Daily PRN Thornton Park, MD      . pseudoephedrine (SUDAFED) 12 hr tablet 120 mg  120 mg Oral BID Thornton Park, MD      . zolpidem (AMBIEN) tablet 5 mg  5 mg Oral QHS PRN,MR X 1 Thornton Park, MD         Discharge Medications: Please see discharge summary for a list of discharge medications.  Relevant Imaging Results:  Relevant Lab Results:   Additional Information (SSN: [id number])  Smith Mince, Student-Social Work

## 2017-08-09 NOTE — Transfer of Care (Signed)
Immediate Anesthesia Transfer of Care Note  Patient: Ian Newman.  Procedure(s) Performed: TOTAL KNEE ARTHROPLASTY (Right )  Patient Location: PACU  Anesthesia Type:Spinal  Level of Consciousness: drowsy and patient cooperative  Airway & Oxygen Therapy: Patient Spontanous Breathing and Patient connected to face mask oxygen  Post-op Assessment: Report given to RN and Post -op Vital signs reviewed and stable  Post vital signs: Reviewed and stable  Last Vitals:  Vitals:   08/09/17 0604 08/09/17 1055  BP: (!) 158/99 121/86  Pulse: 81 87  Resp: 18 19  Temp: 36.5 C (!) 36.2 C  SpO2: 98% 97%    Last Pain:  Vitals:   08/09/17 0604  TempSrc: Tympanic  PainSc: 9          Complications: No apparent anesthesia complications
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
## 2017-08-09 NOTE — Anesthesia Procedure Notes (Signed)
Spinal  Patient location during procedure: OR Start time: 08/09/2017 7:51 AM End time: 08/09/2017 8:01 AM Staffing Anesthesiologist: Piscitello, Joseph K, MD Resident/CRNA: Savage, Kasey, CRNA Performed: anesthesiologist and resident/CRNA  Preanesthetic Checklist Completed: patient identified, site marked, surgical consent, pre-op evaluation, timeout performed, IV checked, risks and benefits discussed and monitors and equipment checked Spinal Block Patient position: sitting Prep: ChloraPrep Patient monitoring: heart rate, continuous pulse ox, blood pressure and cardiac monitor Approach: midline Location: L3-4 Injection technique: single-shot Needle Needle type: Whitacre and Introducer  Needle gauge: 24 G Needle length: 12.7 cm Assessment Sensory level: T10 Additional Notes Multiple attempts by CRNA.  First attempt by MDA. Long 24 used.  Intrathecal space was deep at L3-4 Negative paresthesia. Negative blood return. Positive free-flowing CSF. Expiration date of kit checked and confirmed. Patient tolerated procedure well, without complications.       

## 2017-08-09 NOTE — Progress Notes (Signed)
  Subjective:  POST OP CHECK:  Patient reports right knee pain as marked.  Now improved after IV dilaudid.  Patient is OOB to a chair.  He reports he was able to flex to 70 degrees this afternoon with PT.  His wife is at the bedside.  Objective:   VITALS:   Vitals:   08/09/17 1110 08/09/17 1125 08/09/17 1239 08/09/17 1728  BP: 117/75 120/80 (!) 142/93 (!) 175/102  Pulse: 80 80 79 96  Resp: 19 13 18 18   Temp:   97.9 F (36.6 C) (!) 97.4 F (36.3 C)  TempSrc:   Oral Oral  SpO2: 97% 95% 97% 96%  Weight:      Height:        PHYSICAL EXAM: Right knee: Bandage was reinforced with the help of the patient's nurse.  He had bloody drainage on the inferior aspect of his bandage. Neurovascular intact Sensation intact distally Intact pulses distally Dorsiflexion/Plantar flexion intact  LABS  Results for orders placed or performed during the hospital encounter of 08/09/17 (from the past 24 hour(s))  Urine Drug Screen, Qualitative ([facility] only)     Status: Abnormal   Collection Time: 08/09/17  6:03 AM  Result Value Ref Range   Tricyclic, Ur Screen NONE DETECTED NONE DETECTED   Amphetamines, Ur Screen NONE DETECTED NONE DETECTED   MDMA (Ecstasy)Ur Screen NONE DETECTED NONE DETECTED   Cocaine Metabolite,Ur [place] NONE DETECTED NONE DETECTED   Opiate, Ur Screen NONE DETECTED NONE DETECTED   Phencyclidine (PCP) Ur S NONE DETECTED NONE DETECTED   Cannabinoid 50 Ng, Ur [place] POSITIVE (A) NONE DETECTED   Barbiturates, Ur Screen NONE DETECTED NONE DETECTED   Benzodiazepine, Ur Scrn NONE DETECTED NONE DETECTED   Methadone Scn, Ur NONE DETECTED NONE DETECTED    Dg Knee Right Port  Result Date: 08/09/2017 CLINICAL DATA:  Status post right knee arthroplasty EXAM: PORTABLE RIGHT KNEE - 1-2 VIEW COMPARISON:  None. FINDINGS: Hardware components of a right total knee arthroplasty identified. No periprosthetic fracture or subluxation. Gas is identified within the surrounding soft tissues. Ventral midline  skin staples are identified. IMPRESSION: 1. No complications status post right total knee arthroplasty. Electronically Signed   By: Kerby Moors M.D.   On: 08/09/2017 11:46    Assessment/Plan: Day of Surgery   Active Problems:   S/P TKR (total knee replacement) using cement, right  Patient is stable post-op.  He has tolerated PT.  Patient is ordered for another dose of TXA to stop patient's post-op bleeding.  Continue IV antibiotics x 24 hours.   Foley catheter to be removed in the AM.  Labs will be checked in the AM.  PT will continue tomorrow.  I have reviewed the post-op xrays which show the TKA components are well positioned and there is no evidence of post-op complication.  Lovenox will start tomorrow for DVT prophylaxis.        Thornton Park , MD 08/09/2017, 6:11 PM

## 2017-08-09 NOTE — Progress Notes (Signed)
While working with PT, pt noted to have a moderate amount of bright red blood oozing from bottom of dressing. Pain has been an issue for patient. Pt did well with therapy. Dr. Nicola Police in to see patient. Dressing reinforced. Polar care and KI intact. Orders received. Will continue to monitor.

## 2017-08-09 NOTE — Anesthesia Post-op Follow-up Note (Signed)
Anesthesia QCDR form completed.        

## 2017-08-10 LAB — BASIC METABOLIC PANEL
ANION GAP: 8 (ref 5–15)
BUN: 13 mg/dL (ref 6–20)
CALCIUM: 8.7 mg/dL — AB (ref 8.9–10.3)
CHLORIDE: 100 mmol/L — AB (ref 101–111)
CO2: 25 mmol/L (ref 22–32)
Creatinine, Ser: 1.25 mg/dL — ABNORMAL HIGH (ref 0.61–1.24)
GFR calc non Af Amer: >60 mL/min (ref >60)
Glucose, Bld: 107 mg/dL — ABNORMAL HIGH (ref 65–99)
Potassium: 4.2 mmol/L (ref 3.5–5.1)
SODIUM: 133 mmol/L — AB (ref 135–145)

## 2017-08-10 LAB — CBC
HEMATOCRIT: 37.3 % — AB (ref 40.0–52.0)
HEMOGLOBIN: 12.9 g/dL — AB (ref 13.0–18.0)
MCH: 32.1 pg (ref 26.0–34.0)
MCHC: 34.5 g/dL (ref 32.0–36.0)
MCV: 93 fL (ref 80.0–100.0)
Platelets: 292 10*3/uL (ref 150–440)
RBC: 4.01 MIL/uL — ABNORMAL LOW (ref 4.40–5.90)
RDW: 13.9 % (ref 11.5–14.5)
WBC: 13.1 10*3/uL — AB (ref 3.8–10.6)

## 2017-08-10 MED ORDER — GABAPENTIN 300 MG PO CAPS: 300.0000 mg | ORAL_CAPSULE | Freq: Three times a day (TID) | ORAL | 0 refills | Status: DC

## 2017-08-10 MED ORDER — OXYCODONE HCL 10 MG PO TABS: 5.0000 mg | ORAL_TABLET | ORAL | 0 refills | Status: DC | PRN

## 2017-08-10 MED ORDER — OXYCODONE HCL 5 MG PO TABS: 5.0000 mg | ORAL_TABLET | ORAL | 0 refills | Status: DC | PRN

## 2017-08-10 MED ORDER — OXYCODONE HCL 5 MG PO TABS: 5.0000 mg | ORAL_TABLET | ORAL | Status: DC | PRN

## 2017-08-10 MED ORDER — ENOXAPARIN SODIUM 40 MG/0.4ML ~~LOC~~ SOLN: 40.0000 mg | SUBCUTANEOUS | 0 refills | Status: DC

## 2017-08-10 NOTE — Anesthesia Postprocedure Evaluation (Signed)
Anesthesia Post Note  Patient: Zeppelin Commisso.  Procedure(s) Performed: TOTAL KNEE ARTHROPLASTY (Right )  Patient location during evaluation: Nursing Unit Anesthesia Type: Spinal Level of consciousness: awake, awake and alert and oriented Pain management: pain level controlled Vital Signs Assessment: post-procedure vital signs reviewed and stable Respiratory status: spontaneous breathing Cardiovascular status: blood pressure returned to baseline Postop Assessment: no backache, no apparent nausea or vomiting, adequate PO intake and no headache     Last Vitals:  Vitals:   08/09/17 2356 08/10/17 0436  BP: (!) 143/77 (!) 126/91  Pulse: 95 86  Resp: 18 19  Temp: 36.7 C 37.1 C  SpO2: 98% 98%    Last Pain:  Vitals:   08/10/17 0900  TempSrc:   PainSc: 9                  Markeia Harkless

## 2017-08-10 NOTE — Progress Notes (Signed)
Physical Therapy Treatment Patient Details Name: Ian Newman. MRN: [id number] DOB: Jul 25, 1960 Today's Date: 08/10/2017    History of Present Illness 57 y/o male s/p R TKA 08/09/17, had L knee replaced ~16 months ago    PT Comments    Pt did much better with WBing today and was highly motivated to do a long bout of ambulation, he was initially somewhat pain limited/hesitant, but with increased time/distance was more comfortable and ultimately made it around the nurses' station as well as up/down the steps with single rail.  He had >90 of flexion with PROM and though still painful (and requiring great effort) was able to do AROM SLRs and generally did well with exercises.    Follow Up Recommendations  Home health PT     Equipment Recommendations  Rolling walker with 5" wheels    Recommendations for Other Services       Precautions / Restrictions Precautions Precautions: Fall Restrictions Weight Bearing Restrictions: Yes RLE Weight Bearing: Weight bearing as tolerated    Mobility  Bed Mobility Overal bed mobility: Modified Independent             General bed mobility comments: Pt in recliner on arrival, using UEs holding KI he was able to get LEs back into bed w/o issue  Transfers Overall transfer level: Modified independent Equipment used: Rolling walker (2 wheeled)             General transfer comment: Cuing for set up, but pt was able to rise w/o assist and maintain balance w/o AD, good confidence despite pain  Ambulation/Gait Ambulation/Gait assistance: Modified independent (Device/Increase time) Ambulation Distance (Feet): 250 Feet Assistive device: Rolling walker (2 wheeled)       General Gait Details: Pt with much better tolerance for WBing through R LE and generally was able to maintain consistent cadence and walker motion after an initial few step warm up.  Pt with no LOBs, but did need some cuing to insure that he was positioning the walker  appropriately and maintaining safe posture.   Stairs Stairs: Yes   Stair Management: One rail Right;Sideways Number of Stairs: 4 General stair comments: Pt able to negotiate up/down steps w/o phyiscal assist, minimal reminders for technique  Wheelchair Mobility    Modified Rankin (Stroke Patients Only)       Balance Overall balance assessment: Modified Independent                                          Cognition Arousal/Alertness: Awake/alert Behavior During Therapy: WFL for tasks assessed/performed Overall Cognitive Status: Within Functional Limits for tasks assessed                                        Exercises Total Joint Exercises Ankle Circles/Pumps: Strengthening;10 reps Quad Sets: Strengthening;10 reps Gluteal Sets: Strengthening;10 reps Short Arc Quad: AROM;15 reps Heel Slides: AROM;5 reps Hip ABduction/ADduction: Strengthening;15 reps Straight Leg Raises: AROM;10 reps Knee Flexion: PROM;5 reps Goniometric ROM: 1-92    General Comments        Pertinent Vitals/Pain Pain Assessment: 0-10 Pain Score: 9  Pain Location: R knee Pain Descriptors / Indicators: Aching Pain Intervention(s): Limited activity within patient's tolerance;Repositioned;Monitored during session    Home Living Family/patient expects to be discharged to:: Private residence Living  Arrangements: Spouse/significant other Available Help at Discharge: Family Type of Home: House Home Access: Stairs to enter Entrance Stairs-Rails: Left;Right Home Layout: One level Home Equipment: None      Prior Function Level of Independence: Independent      Comments: Pt apparently recovered well from L TKA, was able to walk w/o AD and take care of all he needed.   PT Goals (current goals can now be found in the care plan section) Acute Rehab PT Goals Patient Stated Goal: to be independent Progress towards PT goals: Progressing toward goals     Frequency    BID      PT Plan Current plan remains appropriate    Co-evaluation              AM-PAC PT "6 Clicks" Daily Activity  Outcome Measure  Difficulty turning over in bed (including adjusting bedclothes, sheets and blankets)?: None Difficulty moving from lying on back to sitting on the side of the bed? : None Difficulty sitting down on and standing up from a chair with arms (e.g., wheelchair, bedside commode, etc,.)?: None Help needed moving to and from a bed to chair (including a wheelchair)?: None Help needed walking in hospital room?: None Help needed climbing 3-5 steps with a railing? : None 6 Click Score: 24    End of Session Equipment Utilized During Treatment: Gait belt Activity Tolerance: Patient limited by pain Patient left: with bed alarm set;with call bell/phone within reach   PT Visit Diagnosis: Muscle weakness (generalized) (M62.81);Difficulty in walking, not elsewhere classified (R26.2);Pain Pain - Right/Left: Right Pain - part of body: Knee     Time: 0825-0905 PT Time Calculation (min) (ACUTE ONLY): 40 min  Charges:  $Gait Training: 8-22 mins $Therapeutic Exercise: 23-37 mins                    G Codes:       Ian Newman, DPT 08/10/2017, 10:12 AM

## 2017-08-10 NOTE — Progress Notes (Signed)
Physical Therapy Treatment Patient Details Name: Ian Newman. MRN: [id number] DOB: 1960/10/01 Today's Date: 08/10/2017    History of Present Illness 57 y/o male s/p R TKA 08/09/17, had L knee replaced ~16 months ago    PT Comments    Pt continues to show good effort despite c/o significant pain.  He was able to easily ambulate around the nurses' station with walker and no KI.  He still has some difficulty with TKE during ambulation and exercises, but is functional with all tasks. He showed good effort with exercises though SLR/against gravity tasks are painful/difficult.  Pt should be able to return home tomorrow with HHPT.   Follow Up Recommendations  Home health PT     Equipment Recommendations  Rolling walker with 5" wheels    Recommendations for Other Services       Precautions / Restrictions Precautions Precautions: Fall Restrictions RLE Weight Bearing: Weight bearing as tolerated    Mobility  Bed Mobility Overal bed mobility: Modified Independent             General bed mobility comments: Pt needed UEs on R LE getting in/out of bed, but able to do so w/o assist.   Transfers Overall transfer level: Independent Equipment used: Rolling walker (2 wheeled)             General transfer comment: Again pt able to rise and maintain balance w/o AD, no KI  Ambulation/Gait Ambulation/Gait assistance: Modified independent (Device/Increase time) Ambulation Distance (Feet): 250 Feet Assistive device: Rolling walker (2 wheeled)       General Gait Details: Pt again with consistent, relatively confident cadence.  He was able to maintain forward walker motion during R stance phase (no KI) but did appear to increase walker use during this.  Overall pt safe, confidence and appropriate for POD1   Stairs Stairs: Yes   Stair Management: One rail Right;Sideways Number of Stairs: 4 General stair comments: Again pt did well with good safety and confidence  Wheelchair  Mobility    Modified Rankin (Stroke Patients Only)       Balance Overall balance assessment: Modified Independent                                          Cognition Arousal/Alertness: Awake/alert Behavior During Therapy: WFL for tasks assessed/performed Overall Cognitive Status: Within Functional Limits for tasks assessed                                        Exercises Total Joint Exercises Ankle Circles/Pumps: Strengthening;10 reps Quad Sets: Strengthening;15 reps Gluteal Sets: Strengthening;15 reps Short Arc Quad: AROM;15 reps Heel Slides: AROM;5 reps Hip ABduction/ADduction: Strengthening;15 reps Straight Leg Raises: AROM;10 reps Knee Flexion: PROM;5 reps    General Comments        Pertinent Vitals/Pain Pain Score: 8     Home Living                      Prior Function            PT Goals (current goals can now be found in the care plan section) Progress towards PT goals: Progressing toward goals    Frequency    BID      PT Plan Current plan remains appropriate  Co-evaluation              AM-PAC PT "6 Clicks" Daily Activity  Outcome Measure  Difficulty turning over in bed (including adjusting bedclothes, sheets and blankets)?: None Difficulty moving from lying on back to sitting on the side of the bed? : None Difficulty sitting down on and standing up from a chair with arms (e.g., wheelchair, bedside commode, etc,.)?: None Help needed moving to and from a bed to chair (including a wheelchair)?: None Help needed walking in hospital room?: None Help needed climbing 3-5 steps with a railing? : None 6 Click Score: 24    End of Session Equipment Utilized During Treatment: Gait belt Activity Tolerance: Patient limited by pain Patient left: with bed alarm set;with call bell/phone within reach   PT Visit Diagnosis: Muscle weakness (generalized) (M62.81);Difficulty in walking, not elsewhere  classified (R26.2);Pain Pain - Right/Left: Right Pain - part of body: Knee     Time: 2449-7530 PT Time Calculation (min) (ACUTE ONLY): 32 min  Charges:  $Gait Training: 8-22 mins $Therapeutic Exercise: 8-22 mins                    G Codes:       Kreg Shropshire, DPT 08/10/2017, 3:18 PM

## 2017-08-10 NOTE — Progress Notes (Signed)
Subjective:  POD #1 s/p right TKA.  Patient reports right knee pain as marked.  Patient did well with physical therapy. He is able to walk on the nurse's station today. Patient denies any shortness breath or chest pain. He states he is using his incentive spirometer every hour. He tolerated a by mouth diet. His Foley catheter has been removed. He has completed 24 hours of antibiotics.  Patient not yet had a bowel movement.  Objective:   VITALS:   Vitals:   08/09/17 1930 08/09/17 2356 08/10/17 0436 08/10/17 1213  BP: (!) 140/97 (!) 143/77 (!) 126/91 (!) 154/84  Pulse: 93 95 86 95  Resp: 19 18 19 18   Temp: 97.7 F (36.5 C) 98 F (36.7 C) 98.8 F (37.1 C) 97.8 F (36.6 C)  TempSrc: Oral Oral Oral Oral  SpO2: 96% 98% 98% 98%  Weight:      Height:        PHYSICAL EXAM: Right lower extremity: I percent changed patient's dressing today. There is no active bleeding or drainage. He has no erythema or ecchymosis. His compartments are soft and compressible. He has intact sensation light touch in palpable pedal pulses. He has intact motor function distally to perform a straight leg raise. Neurovascular intact Sensation intact distally Intact pulses distally Dorsiflexion/Plantar flexion intact Incision: no drainage No cellulitis present Compartment soft  LABS  Results for orders placed or performed during the hospital encounter of 08/09/17 (from the past 24 hour(s))  CBC     Status: Abnormal   Collection Time: 08/10/17  4:12 AM  Result Value Ref Range   WBC 13.1 (H) 3.8 - 10.6 K/uL   RBC 4.01 (L) 4.40 - 5.90 MIL/uL   Hemoglobin 12.9 (L) 13.0 - 18.0 g/dL   HCT 37.3 (L) 40.0 - 52.0 %   MCV 93.0 80.0 - 100.0 fL   MCH 32.1 26.0 - 34.0 pg   MCHC 34.5 32.0 - 36.0 g/dL   RDW 13.9 11.5 - 14.5 %   Platelets 292 150 - 440 K/uL  Basic metabolic panel     Status: Abnormal   Collection Time: 08/10/17  4:12 AM  Result Value Ref Range   Sodium 133 (L) 135 - 145 mmol/L   Potassium 4.2 3.5 -  5.1 mmol/L   Chloride 100 (L) 101 - 111 mmol/L   CO2 25 22 - 32 mmol/L   Glucose, Bld 107 (H) 65 - 99 mg/dL   BUN 13 6 - 20 mg/dL   Creatinine, Ser 1.25 (H) 0.61 - 1.24 mg/dL   Calcium 8.7 (L) 8.9 - 10.3 mg/dL   GFR calc non Af Amer >60 >60 mL/min   GFR calc Af Amer >60 >60 mL/min   Anion gap 8 5 - 15    Dg Knee Right Port  Result Date: 08/09/2017 CLINICAL DATA:  Status post right knee arthroplasty EXAM: PORTABLE RIGHT KNEE - 1-2 VIEW COMPARISON:  None. FINDINGS: Hardware components of a right total knee arthroplasty identified. No periprosthetic fracture or subluxation. Gas is identified within the surrounding soft tissues. Ventral midline skin staples are identified. IMPRESSION: 1. No complications status post right total knee arthroplasty. Electronically Signed   By: Kerby Moors M.D.   On: 08/09/2017 11:46    Assessment/Plan: 1 Day Post-Op   Active Problems:   S/P TKR (total knee replacement) using cement, right  Patient is doing well postop. Pain is his main issue at this point. He is on oxycodone and Dilaudid for breakthrough pain. Continue current  pain management. Continue with physical therapy. Possible discharge tomorrow if his pain is controlled.  Patient is weightbearing as tolerated on the right lower extremity. He should continue elevation and Polar Care to reduce swelling. He will follow up with me in 1-2 weeks in the office and the patient should already have his appointment scheduled.    Thornton Park , MD 08/10/2017, 2:55 PM
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
## 2017-08-10 NOTE — Care Management Note (Signed)
Case Management Note  Patient Details  Name: Ian Newman. MRN: [id number] Date of Birth: 1961-07-13  Subjective/Objective:  Case discussed with Dr. Mack Guise. He states patient will only need outpatient PT, Lovenox 40 mg # 4 weeks.  Met with patient at bedside to discuss discharge plan. Patient lives at home with his spouse. He will need a walker. Ordered from [place] with [facility]. Discussed outpatient PT with patient and he is agreeable. Pharmacy: [facility]- [phone]. Called Lovenox 40 mg X 4 weeks, no refills per Dr. Mack Guise. Cost of lovenox is $145.00. Patient states he can not afford medication. Dr. Mack Guise notified. He will change patient to ASA. Patient updated.                  Action/Plan: OP PT. Walker from [place].   Expected Discharge Date:  08/11/17               Expected Discharge Plan:  OP Rehab  In-House Referral:     Discharge planning Services  CM Consult  Post Acute Care Choice:  Durable Medical Equipment Choice offered to:  Patient  DME Arranged:  Gilford Rile rolling DME Agency:  [place]:    [facility] Agency:     Status of Service:  In process, will continue to follow  If discussed at Long Length of Stay Meetings, dates discussed:    Additional Comments:  Jolly Mango, RN 08/10/2017, 3:18 PM

## 2017-08-10 NOTE — Progress Notes (Signed)
Clinical Social Worker (CSW) received SNF consult. PT is recommending home health. RN case manager aware of above. Please reconsult if future social work needs arise. CSW signing off.   Kashina Mecum, LCSW [phone] 

## 2017-08-10 NOTE — Evaluation (Signed)
Occupational Therapy Evaluation Patient Details Name: Ian Newman. MRN: [id number] DOB: 09-12-1960 Today's Date: 08/10/2017    History of Present Illness 57 y/o male s/p R TKA 08/09/17, had L knee replaced ~16 months ago   Clinical Impression   Patient seen for OT evaluation this date.  Patient complained of pain and fatigue from previous PT session this am. Patient lives at home with his spouse in a one level home with 2 steps to enter.  He has a shower stool but reports he needs a walker. Patient's right wrist is fixed in neutral with no flexion/ext but has supination from previous injury.  He presents with pain in the right knee, decreased ROM, decreased ability to perform self care tasks and would benefit from skilled OT to maximize safety and independence in daily tasks.  He will not likely require follow up OT at discharge.     Follow Up Recommendations  No OT follow up    Equipment Recommendations       Recommendations for Other Services       Precautions / Restrictions Restrictions Weight Bearing Restrictions: Yes RLE Weight Bearing: Weight bearing as tolerated      Mobility Bed Mobility Overal bed mobility: Modified Independent             General bed mobility comments: heavy use of rails and some momentum, but able to rise w/o assist  Transfers Overall transfer level: Modified independent Equipment used: Rolling walker (2 wheeled)                  Balance Overall balance assessment: Modified Independent                                         ADL either performed or assessed with clinical judgement   ADL Overall ADL's : Needs assistance/impaired Eating/Feeding: Modified independent   Grooming: Modified independent   Upper Body Bathing: Modified independent   Lower Body Bathing: Minimal assistance   Upper Body Dressing : Modified independent   Lower Body Dressing: Minimal assistance   Toilet Transfer: Minimal  assistance                   Vision Baseline Vision/History: Wears glasses Wears Glasses: Reading only       Perception     Praxis      Pertinent Vitals/Pain Pain Assessment: 0-10 Pain Score: 6  Pain Location: R knee Pain Descriptors / Indicators: Aching Pain Intervention(s): Limited activity within patient's tolerance;Repositioned;Monitored during session     Hand Dominance Right   Extremity/Trunk Assessment Upper Extremity Assessment Upper Extremity Assessment: Overall WFL for tasks assessed   Lower Extremity Assessment Lower Extremity Assessment: Generalized weakness;RLE deficits/detail RLE Deficits / Details: see PT eval for knee       Communication     Cognition Arousal/Alertness: Awake/alert Behavior During Therapy: WFL for tasks assessed/performed Overall Cognitive Status: Within Functional Limits for tasks assessed                                     General Comments       Exercises     Shoulder Instructions      Home Living Family/patient expects to be discharged to:: Private residence Living Arrangements: Spouse/significant other Available Help at Discharge: Family Type of Home: House  Home Access: Stairs to enter [employer] of Steps: 2 Entrance Stairs-Rails: Left;Right Home Layout: One level     Bathroom Shower/Tub: Corporate investment banker: Standard Bathroom Accessibility: Yes   Home Equipment: None          Prior Functioning/Environment Level of Independence: Independent        Comments: Pt apparently recovered well from L TKA, was able to walk w/o AD and take care of all he needed.        OT Problem List: Decreased strength;Pain;Decreased range of motion;Decreased knowledge of use of DME or AE      OT Treatment/Interventions: Self-care/ADL training;DME and/or AE instruction;Therapeutic activities;Therapeutic exercise;Patient/family education    OT Goals(Current goals can  be found in the care plan section) Acute Rehab OT Goals Patient Stated Goal: to be independent OT Goal Formulation: With patient Time For Goal Achievement: 08/16/17 Potential to Achieve Goals: Good ADL Goals Pt Will Perform Lower Body Dressing: with modified independence  OT Frequency: Min 1X/week   Barriers to D/C:            Co-evaluation              AM-PAC PT "6 Clicks" Daily Activity     Outcome Measure                 End of Session    Activity Tolerance: Patient tolerated treatment well Patient left: in bed;with call bell/phone within reach;with bed alarm set  OT Visit Diagnosis: Muscle weakness (generalized) (M62.81);Pain Pain - Right/Left: Right Pain - part of body: Knee                Time: 2423-5361 OT Time Calculation (min): 17 min Charges:  OT General Charges $OT Visit: 1 Visit OT Evaluation $OT Eval Low Complexity: 1 Low G-Codes:     Shreyas Piatkowski T Lakrisha Iseman, OTR/L, CLT   Klara Stjames 08/10/2017, 9:57 AM

## 2017-08-11 DIAGNOSIS — Z23 Encounter for immunization: Secondary | ICD-10-CM | POA: Diagnosis not present

## 2017-08-11 LAB — CBC
HCT: 33.9 % — ABNORMAL LOW (ref 40.0–52.0)
Hemoglobin: 11.8 g/dL — ABNORMAL LOW (ref 13.0–18.0)
MCH: 32.6 pg (ref 26.0–34.0)
MCHC: 34.7 g/dL (ref 32.0–36.0)
MCV: 93.9 fL (ref 80.0–100.0)
Platelets: 259 10*3/uL (ref 150–440)
RBC: 3.61 MIL/uL — ABNORMAL LOW (ref 4.40–5.90)
RDW: 14.1 % (ref 11.5–14.5)
WBC: 8.5 10*3/uL (ref 3.8–10.6)

## 2017-08-11 MED ORDER — FLEET ENEMA 7-19 GM/118ML RE ENEM
1.0000 | ENEMA | Freq: Once | RECTAL | Status: AC
  Administered 2017-08-11: 1 via RECTAL

## 2017-08-11 MED ORDER — ASPIRIN EC 325 MG PO TBEC: 325.0000 mg | DELAYED_RELEASE_TABLET | Freq: Two times a day (BID) | ORAL | 0 refills | Status: AC

## 2017-08-11 NOTE — Progress Notes (Signed)
Per Dr. Harlow Mares, verbal order to change discharge order for Lovenox to 81mg  asprin BID. This was previously arranged with Dr. Mack Guise but was not changed on the AVS. Dr. Harlow Mares will physically change order when he gets to a computer.

## 2017-08-11 NOTE — Care Management Note (Addendum)
Case Management Note  Patient Details  Name: Ian Newman. MRN: [id number] Date of Birth: 1960-11-09  Subjective/Objective:     Outpatient PT was discussed with Mr Sem earlier this week and a RW has bveen delivered to his hospital room. He cannot afford his co-pay for Lovenox so per CM note yesterday, Dr Mack Guise will change Lovenox to ASA. Mr Goody's nurse was notified that there is still an order for Lovenox on the Discharge medication list.               Action/Plan:   Expected Discharge Date:  08/11/17               Expected Discharge Plan:  Home  In-House Referral:     Discharge planning Services  CM Consult  Post Acute Care Choice:  Durable Medical Equipment Choice offered to:  Patient  DME Arranged:  Gilford Rile rolling DME Agency:  [place]:    [place]:     Status of Service:  Completed, signed off  If discussed at [place] of Stay Meetings, dates discussed:    Additional Comments:  Abia Monaco A, RN 08/11/2017, 9:07 AM

## 2017-08-11 NOTE — Progress Notes (Signed)
Patient is being discharged to home with HH/PT. DC and RX instructions given to patient and wife. They acknowledged understanding of the instructions. Belongings packed, IV removed.

## 2017-08-11 NOTE — Progress Notes (Signed)
  Subjective:  Patient discharged at 10 AM.   I called him at home to talk with him.  He was doing well, still some knee pain.   He was safely at home with his wife and son.    Objective:   VITALS:   Vitals:   08/10/17 1927 08/11/17 0354 08/11/17 0355 08/11/17 0748  BP: (!) 148/88 (!) 160/109 (!) 176/99 (!) 156/84  Pulse: 89 97 96 89  Resp:  19  19  Temp: 98 F (36.7 C) (!) 97.5 F (36.4 C)  98.2 F (36.8 C)  TempSrc: Oral Oral  Oral  SpO2: 99% 97%    Weight:      Height:         LABS  Results for orders placed or performed during the hospital encounter of 08/09/17 (from the past 24 hour(s))  CBC     Status: Abnormal   Collection Time: 08/11/17  4:12 AM  Result Value Ref Range   WBC 8.5 3.8 - 10.6 K/uL   RBC 3.61 (L) 4.40 - 5.90 MIL/uL   Hemoglobin 11.8 (L) 13.0 - 18.0 g/dL   HCT 33.9 (L) 40.0 - 52.0 %   MCV 93.9 80.0 - 100.0 fL   MCH 32.6 26.0 - 34.0 pg   MCHC 34.7 32.0 - 36.0 g/dL   RDW 14.1 11.5 - 14.5 %   Platelets 259 150 - 440 K/uL     Assessment/Plan: 2 Days Post-Op   Active Problems:   S/P TKR (total knee replacement) using cement, right  I explained to the patient and his wife that I would like him to take aspirin 325mg  PO BID for DVT prophylaxis, not the 81mg .   He understood and said he would send his son out to get this aspirin.   He has a PT appointment at our office on 08/14/17.   He is going to see me on 08/20/17.   Patient understood and agreed with this plan.   Thornton Park , MD 08/11/2017, 11:04 AM

## 2017-08-11 NOTE — Progress Notes (Signed)
Patient was given a suppository and mag citrate.  Had 1 small BM

## 2017-08-11 NOTE — Progress Notes (Signed)
Physical Therapy Treatment Patient Details Name: Ian Newman. MRN: [id number] DOB: [birth year] Today's Date: 08/11/2017    History of Present Illness 57 y/o male s/p R TKA 08/09/17, had L knee replaced ~16 months ago    PT Comments    Pt standing in room upon arrival packing belongings in anticipation of discharge to home.  Walker to side and pt using counter for support.  Pt was able to ambulate x 1 around unit this am with walker and generally steady gait but does present with decreased stance time on RLE during gait.  Relies heavily on walker for support.  Pt reports some increased pain today.  Pt tolerated limited exercises and stretching this am.  He had pain meds earlier this am.  Reviewed safety with mobility.  Pt stated he was comfortable with stairs and he demonstrated proficiency yesterday.   Follow Up Recommendations  Home health PT     Equipment Recommendations  Rolling walker with 5" wheels    Recommendations for Other Services       Precautions / Restrictions Precautions Precautions: Fall Restrictions Weight Bearing Restrictions: Yes RLE Weight Bearing: Weight bearing as tolerated    Mobility  Bed Mobility Overal bed mobility: Modified Independent                Transfers Overall transfer level: Independent Equipment used: Rolling walker (2 wheeled)             General transfer comment: Again pt able to rise and maintain balance w/o AD, no KI  Ambulation/Gait Ambulation/Gait assistance: Modified independent (Device/Increase time) Ambulation Distance (Feet): 250 Feet Assistive device: Rolling walker (2 wheeled) Gait Pattern/deviations: Step-to pattern;Decreased weight shift to right;Decreased stance time - right   Gait velocity interpretation: Below normal speed for age/gender     Stairs            Wheelchair Mobility    Modified Rankin (Stroke Patients Only)       Balance Overall balance assessment: Modified Independent                                           Cognition Arousal/Alertness: Awake/alert Behavior During Therapy: WFL for tasks assessed/performed Overall Cognitive Status: Within Functional Limits for tasks assessed                                        Exercises Total Joint Exercises Long Arc Quad: AAROM;5 reps;Seated Knee Flexion: AAROM;Seated;5 reps Goniometric ROM: 1-82 - limited by pain today    General Comments        Pertinent Vitals/Pain Pain Assessment: 0-10 Pain Score: 8  Pain Location: R knee Pain Descriptors / Indicators: Aching Pain Intervention(s): Limited activity within patient's tolerance;Monitored during session;Ice applied    Home Living                      Prior Function            PT Goals (current goals can now be found in the care plan section) Progress towards PT goals: Progressing toward goals    Frequency    BID      PT Plan Current plan remains appropriate    Co-evaluation  AM-PAC PT "6 Clicks" Daily Activity  Outcome Measure  Difficulty turning over in bed (including adjusting bedclothes, sheets and blankets)?: None Difficulty moving from lying on back to sitting on the side of the bed? : None Difficulty sitting down on and standing up from a chair with arms (e.g., wheelchair, bedside commode, etc,.)?: None Help needed moving to and from a bed to chair (including a wheelchair)?: None Help needed walking in hospital room?: None Help needed climbing 3-5 steps with a railing? : None 6 Click Score: 24    End of Session Equipment Utilized During Treatment: Gait belt Activity Tolerance: Patient limited by pain Patient left: in chair;with call bell/phone within reach Nurse Communication: Other (comment) Pain - Right/Left: Right Pain - part of body: Knee     Time: 8588-5027 PT Time Calculation (min) (ACUTE ONLY): 15 min  Charges:  $Gait Training: 8-22 mins                     G Codes:       Chesley Noon, PTA 08/11/17, 9:39 AM

## 2017-08-20 DIAGNOSIS — M1711 Unilateral primary osteoarthritis, right knee: Secondary | ICD-10-CM | POA: Diagnosis not present

## 2017-08-20 DIAGNOSIS — Z96651 Presence of right artificial knee joint: Secondary | ICD-10-CM | POA: Diagnosis not present

## 2017-09-19 DIAGNOSIS — M1711 Unilateral primary osteoarthritis, right knee: Secondary | ICD-10-CM | POA: Diagnosis not present

## 2017-09-26 ENCOUNTER — Other Ambulatory Visit: Payer: Self-pay

## 2017-09-26 ENCOUNTER — Emergency Department
Admission: EM | Admit: 2017-09-26 | Discharge: 2017-09-26 | Disposition: A | Discharge status: Home / Self Care | Payer: Medicare Other | Attending: Emergency Medicine | Admitting: Emergency Medicine

## 2017-09-26 ENCOUNTER — Encounter: Payer: Self-pay | Admitting: Emergency Medicine

## 2017-09-26 DIAGNOSIS — S59912A Unspecified injury of left forearm, initial encounter: Secondary | ICD-10-CM | POA: Diagnosis present

## 2017-09-26 DIAGNOSIS — Z79899 Other long term (current) drug therapy: Secondary | ICD-10-CM | POA: Diagnosis not present

## 2017-09-26 DIAGNOSIS — S51812A Laceration without foreign body of left forearm, initial encounter: Secondary | ICD-10-CM | POA: Insufficient documentation

## 2017-09-26 DIAGNOSIS — Y999 Unspecified external cause status: Secondary | ICD-10-CM | POA: Diagnosis not present

## 2017-09-26 DIAGNOSIS — Y9389 Activity, other specified: Secondary | ICD-10-CM | POA: Diagnosis not present

## 2017-09-26 DIAGNOSIS — Z87891 Personal history of nicotine dependence: Secondary | ICD-10-CM | POA: Diagnosis not present

## 2017-09-26 DIAGNOSIS — I10 Essential (primary) hypertension: Secondary | ICD-10-CM | POA: Diagnosis not present

## 2017-09-26 DIAGNOSIS — Y92009 Unspecified place in unspecified non-institutional (private) residence as the place of occurrence of the external cause: Secondary | ICD-10-CM | POA: Diagnosis not present

## 2017-09-26 DIAGNOSIS — W540XXA Bitten by dog, initial encounter: Secondary | ICD-10-CM | POA: Diagnosis not present

## 2017-09-26 DIAGNOSIS — Z96653 Presence of artificial knee joint, bilateral: Secondary | ICD-10-CM | POA: Insufficient documentation

## 2017-09-26 DIAGNOSIS — J449 Chronic obstructive pulmonary disease, unspecified: Secondary | ICD-10-CM | POA: Diagnosis not present

## 2017-09-26 DIAGNOSIS — Z23 Encounter for immunization: Secondary | ICD-10-CM | POA: Insufficient documentation

## 2017-09-26 MED ORDER — LIDOCAINE HCL (PF) 1 % IJ SOLN
10.0000 mL | Freq: Once | INTRAMUSCULAR | Status: AC
  Administered 2017-09-26: 10 mL
  Filled 2017-09-26: qty 10

## 2017-09-26 MED ORDER — AMOXICILLIN-POT CLAVULANATE 875-125 MG PO TABS
1.0000 | ORAL_TABLET | Freq: Once | ORAL | Status: AC
  Administered 2017-09-26: 1 via ORAL
  Filled 2017-09-26: qty 1

## 2017-09-26 MED ORDER — OXYCODONE-ACETAMINOPHEN 5-325 MG PO TABS: 1.0000 | ORAL_TABLET | Freq: Three times a day (TID) | ORAL | 0 refills | Status: AC | PRN

## 2017-09-26 MED ORDER — AMOXICILLIN-POT CLAVULANATE 875-125 MG PO TABS: 1.0000 | ORAL_TABLET | Freq: Two times a day (BID) | ORAL | 0 refills | Status: DC

## 2017-09-26 MED ORDER — TETANUS-DIPHTH-ACELL PERTUSSIS 5-2.5-18.5 LF-MCG/0.5 IM SUSP
0.5000 mL | Freq: Once | INTRAMUSCULAR | Status: AC
  Administered 2017-09-26: 0.5 mL via INTRAMUSCULAR
  Filled 2017-09-26: qty 0.5

## 2017-09-26 MED ORDER — OXYCODONE-ACETAMINOPHEN 5-325 MG PO TABS
1.0000 | ORAL_TABLET | Freq: Once | ORAL | Status: AC
  Administered 2017-09-26: 1 via ORAL
  Filled 2017-09-26: qty 1

## 2017-09-26 NOTE — Discharge Instructions (Signed)
You have sustained a dog bite to the left forearm. We have loosely closed the wound with a single stitch. Keep the wound clean, dry, and covered. See Dr. Candiss Norse for wound check as needed and suture removal in 1 week. Return to the ED immediately for signs of worsening infection.

## 2017-09-26 NOTE — ED Triage Notes (Signed)
Patient ambulatory to triage with steady gait, without difficulty or distress noted; pt reports neighbor's dog bit him on left FA today; jagged puncture noted with no active bleeding

## 2017-09-27 ENCOUNTER — Other Ambulatory Visit: Payer: Self-pay
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
## 2017-09-28 DIAGNOSIS — M25661 Stiffness of right knee, not elsewhere classified: Secondary | ICD-10-CM | POA: Diagnosis not present

## 2017-09-28 DIAGNOSIS — M25561 Pain in right knee: Secondary | ICD-10-CM | POA: Diagnosis not present

## 2017-09-28 DIAGNOSIS — R609 Edema, unspecified: Secondary | ICD-10-CM | POA: Diagnosis not present

## 2017-10-09 DIAGNOSIS — M25561 Pain in right knee: Secondary | ICD-10-CM | POA: Diagnosis not present

## 2017-10-09 DIAGNOSIS — M25661 Stiffness of right knee, not elsewhere classified: Secondary | ICD-10-CM | POA: Diagnosis not present

## 2017-10-10 DIAGNOSIS — M25569 Pain in unspecified knee: Secondary | ICD-10-CM | POA: Diagnosis not present

## 2017-10-25 IMAGING — CT CT HEAD W/O CM
3 series · 16 of 47 positions shown, 19 images · non-contrast
Comparison: Head CT dated 12/08/2004

CLINICAL DATA: 55-year-old male with unresponsiveness.

EXAM:
CT HEAD WITHOUT CONTRAST
TECHNIQUE: Contiguous axial images were obtained from the base of the skull
through the vertex without intravenous contrast.

[Series 2: head wo · axial · 0.43mm/px · z∈[+475,+605]mm · 10 of 32 slices shown, 13 images]
[im 3/32  brain]
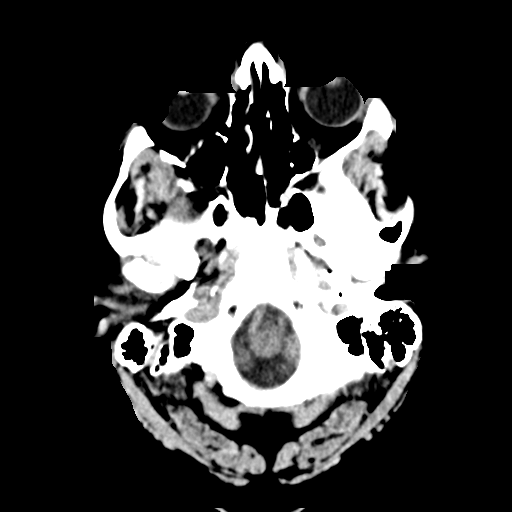
[im 3/32  bone]
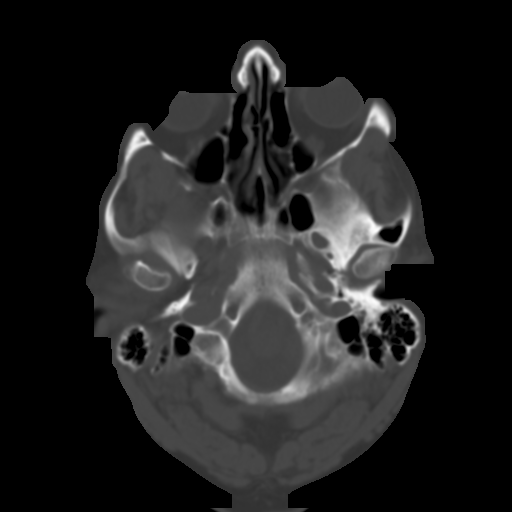
[im 6/32  brain]
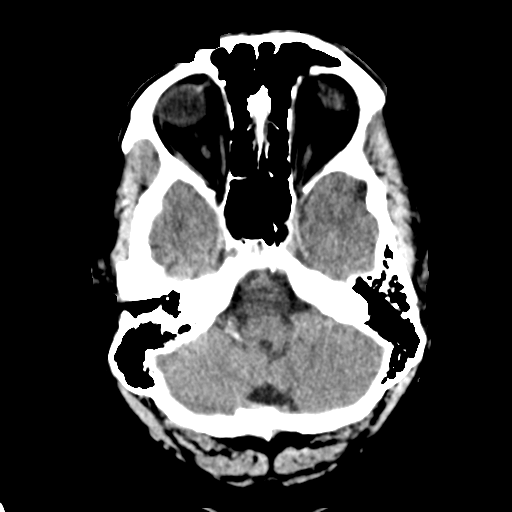
[im 9/32  brain]
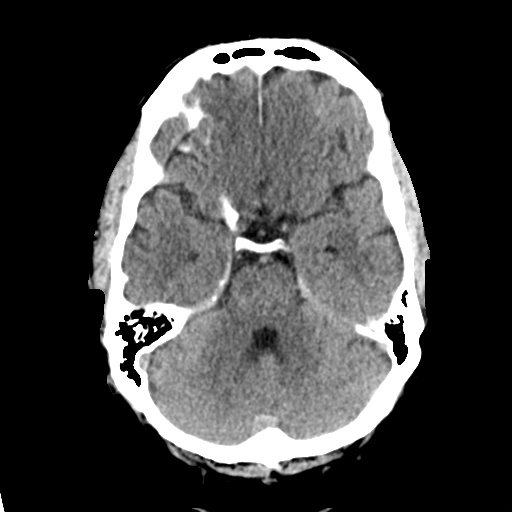
[im 11/32  brain]
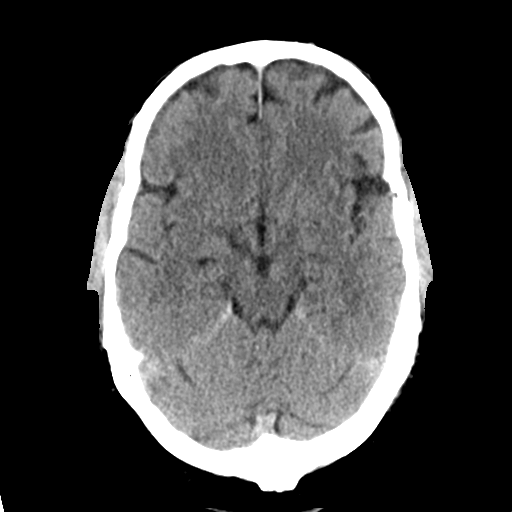
[im 14/32  brain]
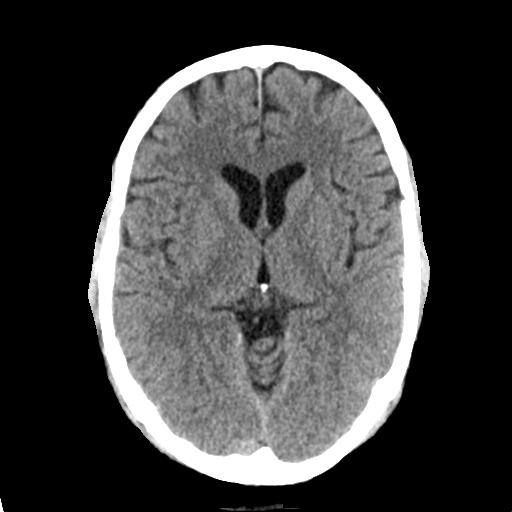
[im 14/32  bone]
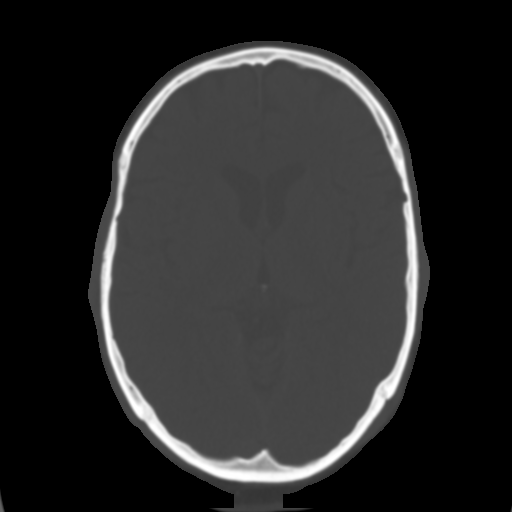
[im 18/32  brain]
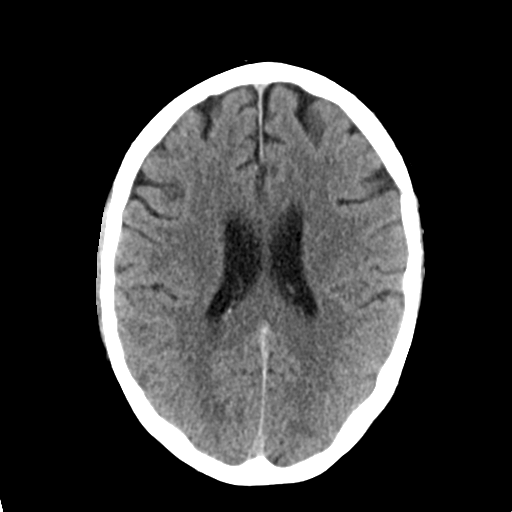
[im 21/32  brain]
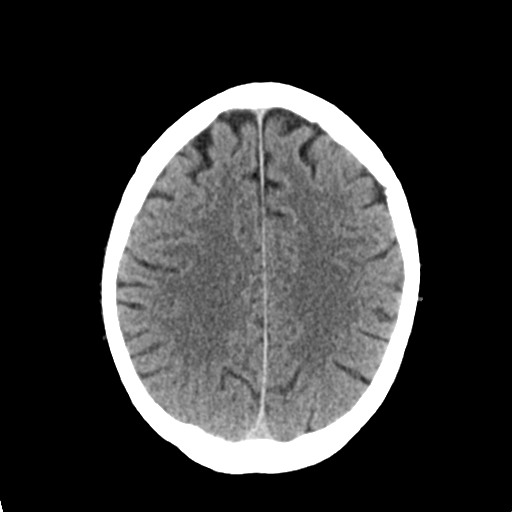
[im 24/32  brain]
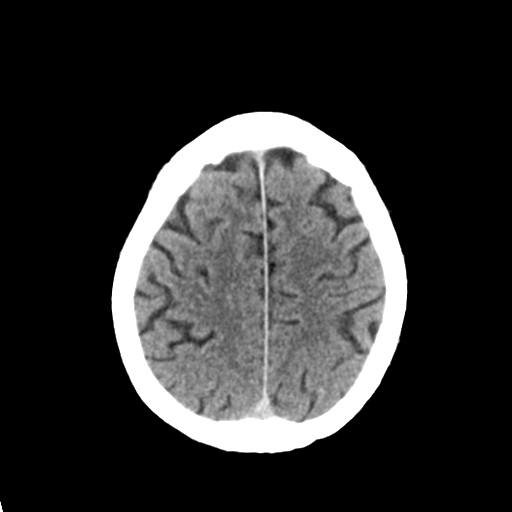
[im 26/32  brain]
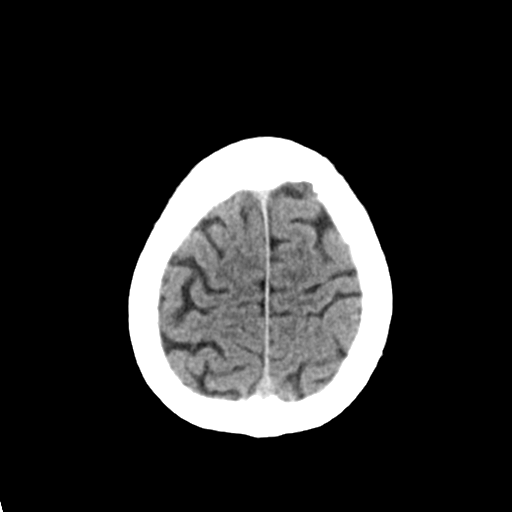
[im 26/32  bone]
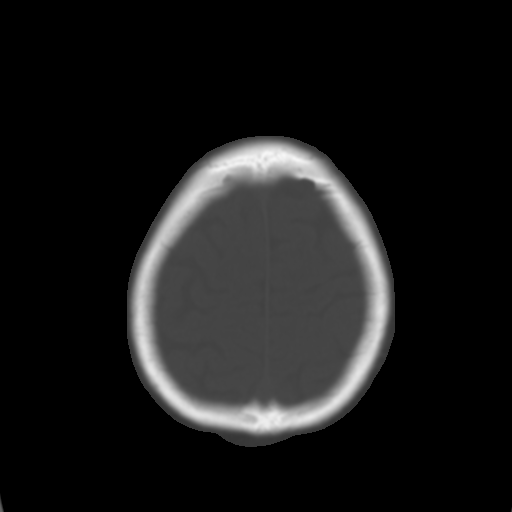
[im 29/32  brain]
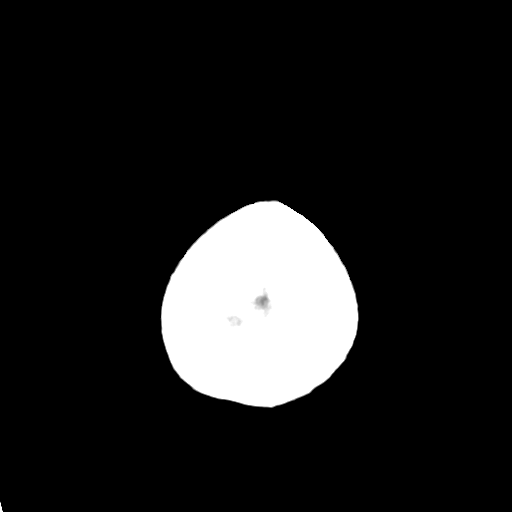

[Series 4: coronal soft tissue · coronal · 0.34mm/px · 3 of 64 slices shown]
[im 22/64  brain]
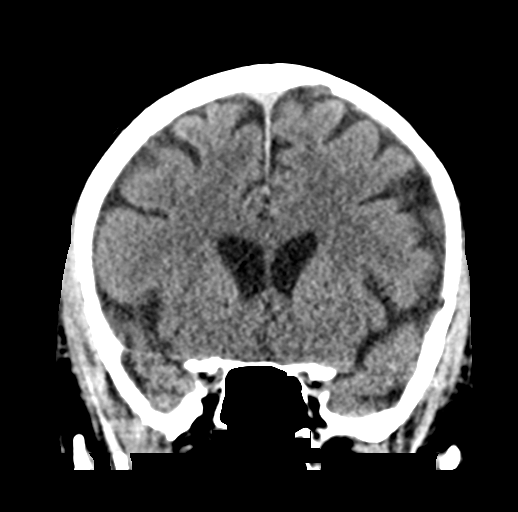
[im 29/64  brain]
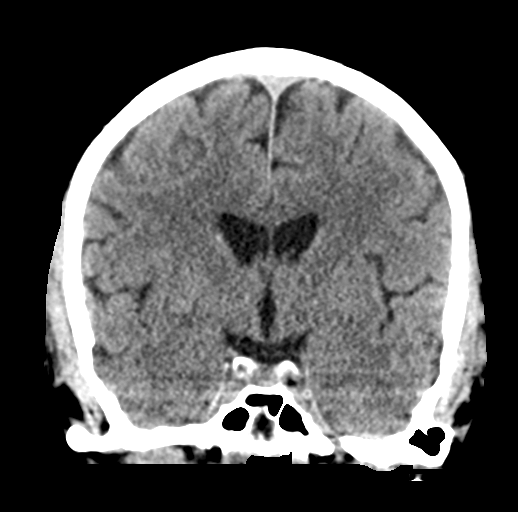
[im 36/64  brain]
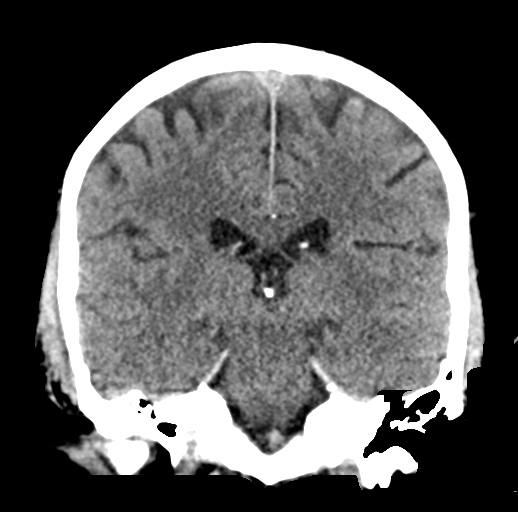

[Series 5: sagittal soft tissue · sagittal · 0.36mm/px · 3 of 53 slices shown]
[im 18/53  brain]
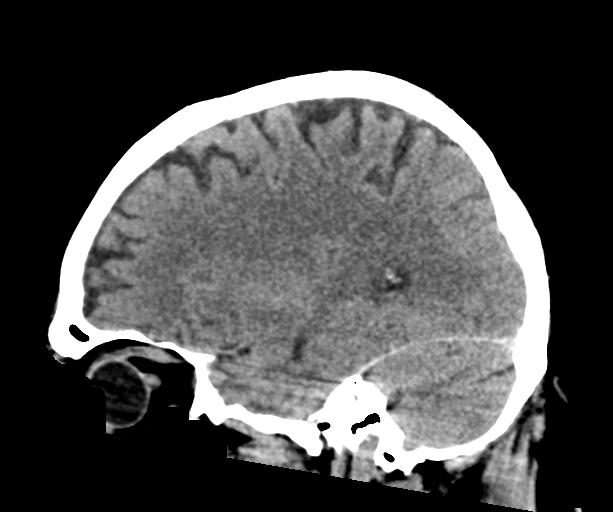
[im 27/53  brain]
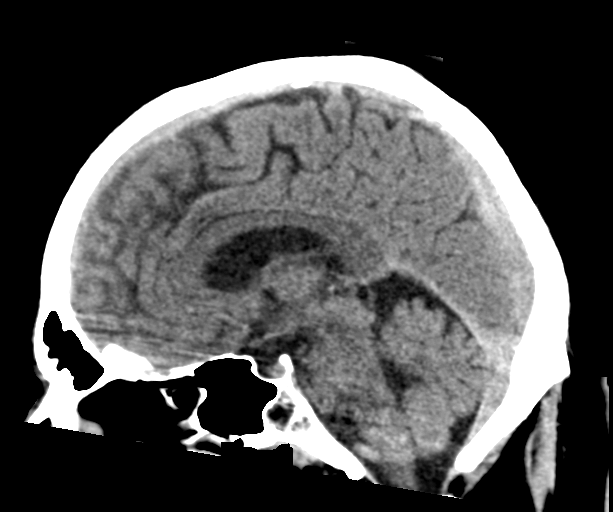
[im 35/53  brain]
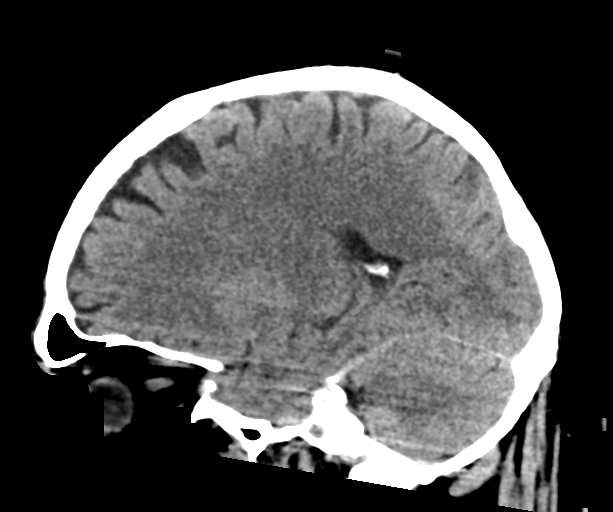

[16 of 47 positions shown; findings below may reference images not displayed]

FINDINGS: Brain: No evidence of acute infarction, hemorrhage, hydrocephalus,
extra-axial collection or mass lesion/mass effect.

Vascular: No hyperdense vessel or unexpected calcification.

Skull: Normal. Negative for fracture or focal lesion.

Sinuses/Orbits: No acute finding.

Other: None.
IMPRESSION: No acute intracranial pathology.

## 2018-03-19 DIAGNOSIS — Z96651 Presence of right artificial knee joint: Secondary | ICD-10-CM | POA: Diagnosis not present

## 2018-03-19 DIAGNOSIS — M25561 Pain in right knee: Secondary | ICD-10-CM | POA: Diagnosis not present

## 2018-06-04 DIAGNOSIS — Z0000 Encounter for general adult medical examination without abnormal findings: Secondary | ICD-10-CM | POA: Diagnosis not present

## 2018-06-04 DIAGNOSIS — Z23 Encounter for immunization: Secondary | ICD-10-CM | POA: Diagnosis not present

## 2018-06-04 DIAGNOSIS — I10 Essential (primary) hypertension: Secondary | ICD-10-CM | POA: Diagnosis not present

## 2018-07-14 ENCOUNTER — Encounter: Payer: Self-pay | Admitting: *Deleted

## 2018-07-14 ENCOUNTER — Other Ambulatory Visit: Payer: Self-pay

## 2018-07-14 ENCOUNTER — Emergency Department
Admission: EM | Admit: 2018-07-14 | Discharge: 2018-07-14 | Disposition: A | Discharge status: Home / Self Care | Payer: Medicare Other | Attending: Emergency Medicine | Admitting: Emergency Medicine

## 2018-07-14 ENCOUNTER — Emergency Department: Payer: Medicare Other

## 2018-07-14 DIAGNOSIS — F419 Anxiety disorder, unspecified: Secondary | ICD-10-CM | POA: Diagnosis not present

## 2018-07-14 DIAGNOSIS — Z96651 Presence of right artificial knee joint: Secondary | ICD-10-CM | POA: Insufficient documentation

## 2018-07-14 DIAGNOSIS — F1210 Cannabis abuse, uncomplicated: Secondary | ICD-10-CM | POA: Insufficient documentation

## 2018-07-14 DIAGNOSIS — R002 Palpitations: Secondary | ICD-10-CM | POA: Insufficient documentation

## 2018-07-14 DIAGNOSIS — R51 Headache: Secondary | ICD-10-CM | POA: Insufficient documentation

## 2018-07-14 DIAGNOSIS — Z79899 Other long term (current) drug therapy: Secondary | ICD-10-CM | POA: Insufficient documentation

## 2018-07-14 DIAGNOSIS — I10 Essential (primary) hypertension: Secondary | ICD-10-CM | POA: Insufficient documentation

## 2018-07-14 DIAGNOSIS — Z96652 Presence of left artificial knee joint: Secondary | ICD-10-CM | POA: Diagnosis not present

## 2018-07-14 DIAGNOSIS — Z87891 Personal history of nicotine dependence: Secondary | ICD-10-CM | POA: Insufficient documentation

## 2018-07-14 DIAGNOSIS — J449 Chronic obstructive pulmonary disease, unspecified: Secondary | ICD-10-CM | POA: Diagnosis not present

## 2018-07-14 DIAGNOSIS — F319 Bipolar disorder, unspecified: Secondary | ICD-10-CM | POA: Diagnosis not present

## 2018-07-14 DIAGNOSIS — R110 Nausea: Secondary | ICD-10-CM | POA: Diagnosis not present

## 2018-07-14 DIAGNOSIS — F4322 Adjustment disorder with anxiety: Secondary | ICD-10-CM | POA: Diagnosis not present

## 2018-07-14 LAB — CBC
HCT: 45.7 % (ref 39.0–52.0)
HEMOGLOBIN: 15.2 g/dL (ref 13.0–17.0)
MCH: 30.9 pg (ref 26.0–34.0)
MCHC: 33.3 g/dL (ref 30.0–36.0)
MCV: 92.9 fL (ref 80.0–100.0)
PLATELETS: 315 10*3/uL (ref 150–400)
RBC: 4.92 MIL/uL (ref 4.22–5.81)
RDW: 13.2 % (ref 11.5–15.5)
WBC: 10.4 10*3/uL (ref 4.0–10.5)
nRBC: 0 % (ref 0.0–0.2)

## 2018-07-14 LAB — BASIC METABOLIC PANEL
ANION GAP: 7 (ref 5–15)
BUN: 18 mg/dL (ref 6–20)
CALCIUM: 9 mg/dL (ref 8.9–10.3)
CO2: 27 mmol/L (ref 22–32)
Chloride: 103 mmol/L (ref 98–111)
Creatinine, Ser: 1.16 mg/dL (ref 0.61–1.24)
GFR calc Af Amer: >60 mL/min (ref >60)
GLUCOSE: 114 mg/dL — AB (ref 70–99)
POTASSIUM: 3.9 mmol/L (ref 3.5–5.1)
SODIUM: 137 mmol/L (ref 135–145)

## 2018-07-14 LAB — CBC WITH DIFFERENTIAL/PLATELET
Abs Immature Granulocytes: 0.03 10*3/uL (ref 0.00–0.07)
BASOS PCT: 1 %
Basophils Absolute: 0.1 10*3/uL (ref 0.0–0.1)
EOS ABS: 0.3 10*3/uL (ref 0.0–0.5)
Eosinophils Relative: 3 %
HCT: 46.6 % (ref 39.0–52.0)
Hemoglobin: 15.3 g/dL (ref 13.0–17.0)
Immature Granulocytes: 0 %
Lymphocytes Relative: 23 %
Lymphs Abs: 1.9 10*3/uL (ref 0.7–4.0)
MCH: 30.6 pg (ref 26.0–34.0)
MCHC: 32.8 g/dL (ref 30.0–36.0)
MCV: 93.2 fL (ref 80.0–100.0)
Monocytes Absolute: 0.7 10*3/uL (ref 0.1–1.0)
Monocytes Relative: 8 %
Neutro Abs: 5.4 10*3/uL (ref 1.7–7.7)
Neutrophils Relative %: 65 %
Platelets: 302 10*3/uL (ref 150–400)
RBC: 5 MIL/uL (ref 4.22–5.81)
RDW: 13.2 % (ref 11.5–15.5)
WBC: 8.3 10*3/uL (ref 4.0–10.5)
nRBC: 0 % (ref 0.0–0.2)

## 2018-07-14 LAB — ETHANOL: Alcohol, Ethyl (B): <10 mg/dL (ref <10)

## 2018-07-14 LAB — URINE DRUG SCREEN, QUALITATIVE (ARMC ONLY)
Amphetamines, Ur Screen: NOT DETECTED
BENZODIAZEPINE, UR SCRN: NOT DETECTED
Barbiturates, Ur Screen: NOT DETECTED
CANNABINOID 50 NG, UR ~~LOC~~: POSITIVE — AB
Cocaine Metabolite,Ur ~~LOC~~: NOT DETECTED
MDMA (Ecstasy)Ur Screen: NOT DETECTED
Methadone Scn, Ur: NOT DETECTED
Opiate, Ur Screen: NOT DETECTED
PHENCYCLIDINE (PCP) UR S: NOT DETECTED
TRICYCLIC, UR SCREEN: NOT DETECTED

## 2018-07-14 LAB — TROPONIN I

## 2018-07-14 MED ORDER — LORAZEPAM 2 MG/ML IJ SOLN
1.0000 mg | Freq: Once | INTRAMUSCULAR | Status: AC
  Administered 2018-07-14: 1 mg via INTRAVENOUS
  Filled 2018-07-14: qty 1

## 2018-07-14 MED ORDER — ACETAMINOPHEN 325 MG PO TABS
650.0000 mg | ORAL_TABLET | Freq: Once | ORAL | Status: AC
  Administered 2018-07-14: 650 mg via ORAL
  Filled 2018-07-14: qty 2

## 2018-07-14 NOTE — ED Notes (Signed)
Patient ambulatory to lobby to wait on ride. Steady gait and NAD noted

## 2018-07-14 NOTE — ED Triage Notes (Signed)
Pt c/o heart skipping beats. Pt states central non-radiating chest pain. Pt states 8-9 hr hx of his heart "skipping beats". Pt states he has walked around the block to try to "get rid of excess energy. Pt c/o diaphoresis during initial sensation that his heart was skipping beats. Pt denies shortness of breath at this time and states nothing makes his pain worse or better.

## 2018-07-14 NOTE — Discharge Instructions (Addendum)
Please take your blood pressure medication as prescribed, return to the emergency room for any new or worrisome symptoms including chest pain shortness of breath nausea or vomiting, numbness or weakness.  It does appear that you have some sleep disturbances recently we do advised that you try to go to bed at the same time every night, please talk to your doctor about this.  If you have any thoughts of hurting yourself or anybody else, hallucinations or you feel worse in any way, please return to the emergency department.  You do seem to drink more alcohol than is strictly speaking good for you.  We would ask that you talk to your doctor about that.  If you feel like you are having withdrawal symptoms such as the shakes etc. as we discussed please return to the emergency room.  Do not drive after the medications you have received here
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
## 2018-07-14 NOTE — ED Notes (Signed)
Spoke with Dr. Mable Paris regarding patient and he will put in orders.

## 2018-07-14 NOTE — ED Notes (Signed)
Patient given warm blanket and repositioned in bed. Lights off and curtain closed per patient request.

## 2018-07-14 NOTE — ED Triage Notes (Signed)
Patient reports he has been coughing and feels like his heart if skipping.  Reports seen in ED earlier for similar symptoms.
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
## 2018-07-15 ENCOUNTER — Emergency Department
Admission: EM | Admit: 2018-07-15 | Discharge: 2018-07-15 | Disposition: A | Discharge status: Home / Self Care | Payer: Medicare Other | Attending: Emergency Medicine | Admitting: Emergency Medicine

## 2018-07-15 ENCOUNTER — Emergency Department
Admission: EM | Admit: 2018-07-15 | Discharge: 2018-07-15 | Disposition: A | Discharge status: Home / Self Care | Payer: Medicare Other | Source: Home / Self Care | Attending: Emergency Medicine | Admitting: Emergency Medicine

## 2018-07-15 ENCOUNTER — Other Ambulatory Visit: Payer: Self-pay

## 2018-07-15 DIAGNOSIS — R002 Palpitations: Secondary | ICD-10-CM | POA: Diagnosis present

## 2018-07-15 DIAGNOSIS — J449 Chronic obstructive pulmonary disease, unspecified: Secondary | ICD-10-CM | POA: Diagnosis not present

## 2018-07-15 DIAGNOSIS — Z79899 Other long term (current) drug therapy: Secondary | ICD-10-CM | POA: Insufficient documentation

## 2018-07-15 DIAGNOSIS — Z87891 Personal history of nicotine dependence: Secondary | ICD-10-CM | POA: Insufficient documentation

## 2018-07-15 DIAGNOSIS — F419 Anxiety disorder, unspecified: Secondary | ICD-10-CM | POA: Diagnosis not present

## 2018-07-15 DIAGNOSIS — F4322 Adjustment disorder with anxiety: Secondary | ICD-10-CM

## 2018-07-15 DIAGNOSIS — F1220 Cannabis dependence, uncomplicated: Secondary | ICD-10-CM

## 2018-07-15 DIAGNOSIS — F4320 Adjustment disorder, unspecified: Secondary | ICD-10-CM

## 2018-07-15 DIAGNOSIS — F319 Bipolar disorder, unspecified: Secondary | ICD-10-CM

## 2018-07-15 DIAGNOSIS — F17200 Nicotine dependence, unspecified, uncomplicated: Secondary | ICD-10-CM

## 2018-07-15 LAB — URINE DRUG SCREEN, QUALITATIVE (ARMC ONLY)
Amphetamines, Ur Screen: NOT DETECTED
BENZODIAZEPINE, UR SCRN: NOT DETECTED
Barbiturates, Ur Screen: NOT DETECTED
Cannabinoid 50 Ng, Ur ~~LOC~~: POSITIVE — AB
Cocaine Metabolite,Ur ~~LOC~~: NOT DETECTED
MDMA (Ecstasy)Ur Screen: NOT DETECTED
METHADONE SCREEN, URINE: NOT DETECTED
OPIATE, UR SCREEN: NOT DETECTED
Phencyclidine (PCP) Ur S: NOT DETECTED
Tricyclic, Ur Screen: NOT DETECTED

## 2018-07-15 LAB — COMPREHENSIVE METABOLIC PANEL
ALK PHOS: 88 U/L (ref 38–126)
ALT: 25 U/L (ref 0–44)
AST: 33 U/L (ref 15–41)
Albumin: 4.3 g/dL (ref 3.5–5.0)
Anion gap: 6 (ref 5–15)
BUN: 14 mg/dL (ref 6–20)
CO2: 28 mmol/L (ref 22–32)
Calcium: 9 mg/dL (ref 8.9–10.3)
Chloride: 102 mmol/L (ref 98–111)
Creatinine, Ser: 0.88 mg/dL (ref 0.61–1.24)
GFR calc Af Amer: >60 mL/min (ref >60)
GFR calc non Af Amer: >60 mL/min (ref >60)
Glucose, Bld: 116 mg/dL — ABNORMAL HIGH (ref 70–99)
Potassium: 3.6 mmol/L (ref 3.5–5.1)
Sodium: 136 mmol/L (ref 135–145)
Total Bilirubin: 0.7 mg/dL (ref 0.3–1.2)
Total Protein: 7.6 g/dL (ref 6.5–8.1)

## 2018-07-15 LAB — CK: Total CK: 224 U/L (ref 49–397)

## 2018-07-15 LAB — TSH: TSH: 1.313 u[IU]/mL (ref 0.350–4.500)

## 2018-07-15 LAB — TROPONIN I: Troponin I: <0.03 ng/mL (ref <0.03)

## 2018-07-15 LAB — T4, FREE: Free T4: 0.84 ng/dL (ref 0.82–1.77)

## 2018-07-15 MED ORDER — HYDROXYZINE HCL 25 MG PO TABS
100.0000 mg | ORAL_TABLET | Freq: Once | ORAL | Status: AC
  Administered 2018-07-15: 100 mg via ORAL
  Filled 2018-07-15: qty 4
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
## 2018-07-15 NOTE — ED Triage Notes (Signed)
Pt arrives today ambulatory  - pt states  "I am back because I am bipolar - I went to that place they told me to go to and they said it would be 150 dollars to see me so I came here to get some medicine.  I took some vistaril yesterday while I was here and I slept like a baby last night."

## 2018-07-15 NOTE — ED Triage Notes (Signed)
First Nurse Note:  Arrives stating "I am Bipolar and I need some help".  Denies SI/ HI.  STates I need help with my medicine.  Currently off all antipsychotics.

## 2018-07-15 NOTE — ED Notes (Signed)
Pt given meal tray and drink at this time 
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
## 2018-07-15 NOTE — ED Notes (Signed)
Dr.Clapacs at bedside  

## 2018-07-15 NOTE — Discharge Instructions (Signed)
Fortunately today all of your blood work, your chest XR, your ekg, are all very reassuring.  I think your symptoms are related to your bipolar disorder that isn't being treated.  Please follow up in clinic tomorrow for a recheck with a psychiatrist and return to the ED sooner for any concerns.  It was a pleasure to take care of you today, and thank you for coming to our emergency department.  If you have any questions or concerns before leaving please ask the nurse to grab me and I'm more than happy to go through your aftercare instructions again.  If you have any concerns once you are home that you are not improving or are in fact getting worse before you can make it to your follow-up appointment, please do not hesitate to call 911 and come back for further evaluation.  Darel Hong, MD  Results for orders placed or performed during the hospital encounter of 07/15/18  Comprehensive metabolic panel  Result Value Ref Range   Sodium 136 135 - 145 mmol/L   Potassium 3.6 3.5 - 5.1 mmol/L   Chloride 102 98 - 111 mmol/L   CO2 28 22 - 32 mmol/L   Glucose, Bld 116 (H) 70 - 99 mg/dL   BUN 14 6 - 20 mg/dL   Creatinine, Ser 0.88 0.61 - 1.24 mg/dL   Calcium 9.0 8.9 - 10.3 mg/dL   Total Protein 7.6 6.5 - 8.1 g/dL   Albumin 4.3 3.5 - 5.0 g/dL   AST 33 15 - 41 U/L   ALT 25 0 - 44 U/L   Alkaline Phosphatase 88 38 - 126 U/L   Total Bilirubin 0.7 0.3 - 1.2 mg/dL   GFR calc non Af Amer >60 >60 mL/min   GFR calc Af Amer >60 >60 mL/min   Anion gap 6 5 - 15  Ethanol  Result Value Ref Range   Alcohol, Ethyl (B) <10 <10 mg/dL  CBC with Differential  Result Value Ref Range   WBC 8.3 4.0 - 10.5 K/uL   RBC 5.00 4.22 - 5.81 MIL/uL   Hemoglobin 15.3 13.0 - 17.0 g/dL   HCT 46.6 39.0 - 52.0 %   MCV 93.2 80.0 - 100.0 fL   MCH 30.6 26.0 - 34.0 pg   MCHC 32.8 30.0 - 36.0 g/dL   RDW 13.2 11.5 - 15.5 %   Platelets 302 150 - 400 K/uL   nRBC 0.0 0.0 - 0.2 %   Neutrophils Relative % 65 %   Neutro Abs 5.4 1.7  - 7.7 K/uL   Lymphocytes Relative 23 %   Lymphs Abs 1.9 0.7 - 4.0 K/uL   Monocytes Relative 8 %   Monocytes Absolute 0.7 0.1 - 1.0 K/uL   Eosinophils Relative 3 %   Eosinophils Absolute 0.3 0.0 - 0.5 K/uL   Basophils Relative 1 %   Basophils Absolute 0.1 0.0 - 0.1 K/uL   Immature Granulocytes 0 %   Abs Immature Granulocytes 0.03 0.00 - 0.07 K/uL  Troponin I - Once  Result Value Ref Range   Troponin I <0.03 <0.03 ng/mL  TSH  Result Value Ref Range   TSH 1.313 0.350 - 4.500 uIU/mL  T4, free  Result Value Ref Range   Free T4 0.84 0.82 - 1.77 ng/dL  CK  Result Value Ref Range   Total CK 224 49 - 397 U/L   Dg Chest 2 View  Result Date: 07/14/2018 CLINICAL DATA:  Acute onset of palpitations. EXAM: CHEST - 2 VIEW  COMPARISON:  Chest radiograph performed 07/27/2017 FINDINGS: The lungs are well-aerated and clear. There is no evidence of focal opacification, pleural effusion or pneumothorax. A right-sided nipple shadow is noted. The heart is normal in size; the mediastinal contour is within normal limits. No acute osseous abnormalities are seen. IMPRESSION: No acute cardiopulmonary process seen. Electronically Signed   By: Garald Balding M.D.   On: 07/14/2018 06:48

## 2018-07-15 NOTE — ED Notes (Signed)

## 2018-07-15 NOTE — Discharge Instructions (Signed)
Return to the emergency room for any new or worrisome symptoms.  Blood pressure was slightly elevated, I do suggest that you get that rechecked at your doctor tomorrow.  Please follow-up closely with a psychiatric care teams that we have referred you to.  If you have any thoughts of hurting yourself or any other new or worrisome symptoms including chest pain, shortness of breath, thoughts of hurting yourself or anyone else, shakes or seizures or withdrawal symptoms or other concerns please return to the ER

## 2018-07-18 DIAGNOSIS — R079 Chest pain, unspecified: Secondary | ICD-10-CM | POA: Diagnosis not present

## 2018-07-18 DIAGNOSIS — R0602 Shortness of breath: Secondary | ICD-10-CM | POA: Diagnosis not present

## 2018-07-18 DIAGNOSIS — I10 Essential (primary) hypertension: Secondary | ICD-10-CM | POA: Diagnosis not present

## 2018-07-18 DIAGNOSIS — F419 Anxiety disorder, unspecified: Secondary | ICD-10-CM | POA: Diagnosis not present

## 2018-07-19 ENCOUNTER — Other Ambulatory Visit: Payer: Self-pay

## 2018-07-19 ENCOUNTER — Emergency Department
Admission: EM | Admit: 2018-07-19 | Discharge: 2018-07-19 | Disposition: A | Discharge status: Home / Self Care | Payer: Commercial Managed Care - HMO | Attending: Emergency Medicine | Admitting: Emergency Medicine

## 2018-07-19 ENCOUNTER — Emergency Department: Payer: Commercial Managed Care - HMO

## 2018-07-19 ENCOUNTER — Encounter: Payer: Self-pay | Admitting: *Deleted

## 2018-07-19 DIAGNOSIS — R0602 Shortness of breath: Secondary | ICD-10-CM | POA: Diagnosis not present

## 2018-07-19 DIAGNOSIS — Z79899 Other long term (current) drug therapy: Secondary | ICD-10-CM | POA: Diagnosis not present

## 2018-07-19 DIAGNOSIS — Z87891 Personal history of nicotine dependence: Secondary | ICD-10-CM | POA: Insufficient documentation

## 2018-07-19 DIAGNOSIS — R079 Chest pain, unspecified: Secondary | ICD-10-CM | POA: Diagnosis not present

## 2018-07-19 DIAGNOSIS — I10 Essential (primary) hypertension: Secondary | ICD-10-CM | POA: Diagnosis not present

## 2018-07-19 DIAGNOSIS — J449 Chronic obstructive pulmonary disease, unspecified: Secondary | ICD-10-CM | POA: Insufficient documentation

## 2018-07-19 DIAGNOSIS — F419 Anxiety disorder, unspecified: Secondary | ICD-10-CM | POA: Insufficient documentation

## 2018-07-19 LAB — BASIC METABOLIC PANEL
Anion gap: 5 (ref 5–15)
BUN: 18 mg/dL (ref 6–20)
CO2: 31 mmol/L (ref 22–32)
Calcium: 9 mg/dL (ref 8.9–10.3)
Chloride: 103 mmol/L (ref 98–111)
Creatinine, Ser: 1.06 mg/dL (ref 0.61–1.24)
GFR calc Af Amer: >60 mL/min (ref >60)
GFR calc non Af Amer: >60 mL/min (ref >60)
Glucose, Bld: 122 mg/dL — ABNORMAL HIGH (ref 70–99)
POTASSIUM: 3.6 mmol/L (ref 3.5–5.1)
Sodium: 139 mmol/L (ref 135–145)

## 2018-07-19 LAB — CBC
HCT: 45 % (ref 39.0–52.0)
HEMOGLOBIN: 14.8 g/dL (ref 13.0–17.0)
MCH: 30.7 pg (ref 26.0–34.0)
MCHC: 32.9 g/dL (ref 30.0–36.0)
MCV: 93.4 fL (ref 80.0–100.0)
Platelets: 317 10*3/uL (ref 150–400)
RBC: 4.82 MIL/uL (ref 4.22–5.81)
RDW: 13.5 % (ref 11.5–15.5)
WBC: 8.8 10*3/uL (ref 4.0–10.5)
nRBC: 0 % (ref 0.0–0.2)

## 2018-07-19 LAB — TROPONIN I: Troponin I: <0.03 ng/mL (ref <0.03)

## 2018-07-19 NOTE — Discharge Instructions (Signed)
Fortunately today your blood work, your EKG, and chest x-ray were reassuring.  It is critically important that you make an appointment to follow-up with your primary care physician regarding your long-term high blood pressure.  Return to the emergency department for any concerns.  It was a pleasure to take care of you today, and thank you for coming to our emergency department.  If you have any questions or concerns before leaving please ask the nurse to grab me and I'm more than happy to go through your aftercare instructions again.  If you were prescribed any opioid pain medication today such as Norco, Vicodin, Percocet, morphine, hydrocodone, or oxycodone please make sure you do not drive when you are taking this medication as it can alter your ability to drive safely.  If you have any concerns once you are home that you are not improving or are in fact getting worse before you can make it to your follow-up appointment, please do not hesitate to call 911 and come back for further evaluation.  Darel Hong, MD  Results for orders placed or performed during the hospital encounter of 02/58/52  Basic metabolic panel  Result Value Ref Range   Sodium 139 135 - 145 mmol/L   Potassium 3.6 3.5 - 5.1 mmol/L   Chloride 103 98 - 111 mmol/L   CO2 31 22 - 32 mmol/L   Glucose, Bld 122 (H) 70 - 99 mg/dL   BUN 18 6 - 20 mg/dL   Creatinine, Ser 1.06 0.61 - 1.24 mg/dL   Calcium 9.0 8.9 - 10.3 mg/dL   GFR calc non Af Amer >60 >60 mL/min   GFR calc Af Amer >60 >60 mL/min   Anion gap 5 5 - 15  CBC  Result Value Ref Range   WBC 8.8 4.0 - 10.5 K/uL   RBC 4.82 4.22 - 5.81 MIL/uL   Hemoglobin 14.8 13.0 - 17.0 g/dL   HCT 45.0 39.0 - 52.0 %   MCV 93.4 80.0 - 100.0 fL   MCH 30.7 26.0 - 34.0 pg   MCHC 32.9 30.0 - 36.0 g/dL   RDW 13.5 11.5 - 15.5 %   Platelets 317 150 - 400 K/uL   nRBC 0.0 0.0 - 0.2 %  Troponin I - ONCE - STAT  Result Value Ref Range   Troponin I <0.03 <0.03 ng/mL   Dg Chest 2  View  Result Date: 07/19/2018 CLINICAL DATA:  Chest pain EXAM: CHEST - 2 VIEW COMPARISON:  07/14/2018 FINDINGS: The heart size and mediastinal contours are within normal limits. Both lungs are clear. The visualized skeletal structures are unremarkable. IMPRESSION: No active cardiopulmonary disease. Electronically Signed   By: Ulyses Jarred M.D.   On: 07/19/2018 02:01   Dg Chest 2 View  Result Date: 07/14/2018 CLINICAL DATA:  Acute onset of palpitations. EXAM: CHEST - 2 VIEW COMPARISON:  Chest radiograph performed 07/27/2017 FINDINGS: The lungs are well-aerated and clear. There is no evidence of focal opacification, pleural effusion or pneumothorax. A right-sided nipple shadow is noted. The heart is normal in size; the mediastinal contour is within normal limits. No acute osseous abnormalities are seen. IMPRESSION: No acute cardiopulmonary process seen. Electronically Signed   By: Garald Balding M.D.   On: 07/14/2018 06:48

## 2018-07-19 NOTE — ED Triage Notes (Signed)
Pt ambulatory to triage.  Pt states his blood pressure is up.  Pt took bp meds this am.  Pt also reports chest pain and labored breathing.  Pt alert.  Speech clear

## 2018-07-19 NOTE — ED Notes (Signed)
Reviewed discharge instructions and follow-up care with patient. Patient verbalized understanding of all information reviewed. Patient stable, with no distress noted at this time.    
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
## 2018-07-22 ENCOUNTER — Other Ambulatory Visit: Payer: Self-pay

## 2018-07-22 ENCOUNTER — Encounter: Payer: Self-pay | Admitting: Emergency Medicine

## 2018-07-22 ENCOUNTER — Emergency Department
Admission: EM | Admit: 2018-07-22 | Discharge: 2018-07-22 | Disposition: A | Discharge status: Home / Self Care | Payer: Medicare HMO | Attending: Emergency Medicine | Admitting: Emergency Medicine

## 2018-07-22 DIAGNOSIS — Z96653 Presence of artificial knee joint, bilateral: Secondary | ICD-10-CM | POA: Diagnosis not present

## 2018-07-22 DIAGNOSIS — F319 Bipolar disorder, unspecified: Secondary | ICD-10-CM | POA: Diagnosis not present

## 2018-07-22 DIAGNOSIS — G4700 Insomnia, unspecified: Secondary | ICD-10-CM | POA: Insufficient documentation

## 2018-07-22 DIAGNOSIS — F1210 Cannabis abuse, uncomplicated: Secondary | ICD-10-CM | POA: Insufficient documentation

## 2018-07-22 DIAGNOSIS — J449 Chronic obstructive pulmonary disease, unspecified: Secondary | ICD-10-CM | POA: Diagnosis not present

## 2018-07-22 DIAGNOSIS — Z79899 Other long term (current) drug therapy: Secondary | ICD-10-CM | POA: Insufficient documentation

## 2018-07-22 DIAGNOSIS — I10 Essential (primary) hypertension: Secondary | ICD-10-CM | POA: Diagnosis not present

## 2018-07-22 DIAGNOSIS — F419 Anxiety disorder, unspecified: Secondary | ICD-10-CM | POA: Diagnosis not present

## 2018-07-22 DIAGNOSIS — Z87891 Personal history of nicotine dependence: Secondary | ICD-10-CM | POA: Diagnosis not present

## 2018-07-22 MED ORDER — HYDROXYZINE HCL 25 MG PO TABS: 25.0000 mg | ORAL_TABLET | Freq: Three times a day (TID) | ORAL | 0 refills | Status: DC | PRN

## 2018-07-22 NOTE — ED Triage Notes (Signed)
Pt presents to ED via POV with c/o feeling manic, pt states hx of bipolar but is not taking any medications. Pt denies SI/HI at this time.

## 2018-07-22 NOTE — ED Notes (Signed)
Pt denies SI, HI, and A/V hallucinations.  Contracts for safety at this time.  Pt verbalized understanding of discharge instructions at this time.
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
## 2018-07-22 NOTE — ED Notes (Signed)
Pt dressed out into maroon scrubs by this tech and TEFL teacher. Belongings include: shoes, socks, grey sweater, white undershirt, blue sweat pants, underwear, cell phone, wallet: 12 20 dollar bills, 1 10 dollar bill, 2 5's dollar bills 3 1 dollar bills, plaid jacket.

## 2018-07-24 DIAGNOSIS — I10 Essential (primary) hypertension: Secondary | ICD-10-CM | POA: Diagnosis not present

## 2018-09-14 ENCOUNTER — Emergency Department
Admission: EM | Admit: 2018-09-14 | Discharge: 2018-09-15 | Disposition: A | Discharge status: Other Acute Inpatient Hospital | Payer: Medicare HMO | Attending: Emergency Medicine | Admitting: Emergency Medicine

## 2018-09-14 ENCOUNTER — Other Ambulatory Visit: Payer: Self-pay

## 2018-09-14 ENCOUNTER — Encounter: Payer: Self-pay | Admitting: Emergency Medicine

## 2018-09-14 ENCOUNTER — Emergency Department: Payer: Medicare HMO

## 2018-09-14 DIAGNOSIS — Z87891 Personal history of nicotine dependence: Secondary | ICD-10-CM | POA: Insufficient documentation

## 2018-09-14 DIAGNOSIS — Z79899 Other long term (current) drug therapy: Secondary | ICD-10-CM | POA: Diagnosis not present

## 2018-09-14 DIAGNOSIS — F1210 Cannabis abuse, uncomplicated: Secondary | ICD-10-CM | POA: Diagnosis not present

## 2018-09-14 DIAGNOSIS — G9340 Encephalopathy, unspecified: Secondary | ICD-10-CM | POA: Diagnosis not present

## 2018-09-14 DIAGNOSIS — J449 Chronic obstructive pulmonary disease, unspecified: Secondary | ICD-10-CM | POA: Diagnosis not present

## 2018-09-14 DIAGNOSIS — I10 Essential (primary) hypertension: Secondary | ICD-10-CM | POA: Diagnosis not present

## 2018-09-14 DIAGNOSIS — F319 Bipolar disorder, unspecified: Secondary | ICD-10-CM | POA: Diagnosis not present

## 2018-09-14 DIAGNOSIS — Z96653 Presence of artificial knee joint, bilateral: Secondary | ICD-10-CM | POA: Insufficient documentation

## 2018-09-14 DIAGNOSIS — R4182 Altered mental status, unspecified: Secondary | ICD-10-CM | POA: Insufficient documentation

## 2018-09-14 LAB — COMPREHENSIVE METABOLIC PANEL
ALT: 25 U/L (ref 0–44)
AST: 26 U/L (ref 15–41)
Albumin: 4.3 g/dL (ref 3.5–5.0)
Alkaline Phosphatase: 87 U/L (ref 38–126)
Anion gap: 6 (ref 5–15)
BUN: 12 mg/dL (ref 6–20)
CO2: 30 mmol/L (ref 22–32)
Calcium: 8.9 mg/dL (ref 8.9–10.3)
Chloride: 101 mmol/L (ref 98–111)
Creatinine, Ser: 0.88 mg/dL (ref 0.61–1.24)
GFR calc Af Amer: >60 mL/min (ref >60)
GFR calc non Af Amer: >60 mL/min (ref >60)
GLUCOSE: 132 mg/dL — AB (ref 70–99)
Potassium: 4.4 mmol/L (ref 3.5–5.1)
Sodium: 137 mmol/L (ref 135–145)
TOTAL PROTEIN: 7.3 g/dL (ref 6.5–8.1)
Total Bilirubin: 0.6 mg/dL (ref 0.3–1.2)

## 2018-09-14 LAB — URINE DRUG SCREEN, QUALITATIVE (ARMC ONLY)
Amphetamines, Ur Screen: NOT DETECTED
Barbiturates, Ur Screen: NOT DETECTED
Benzodiazepine, Ur Scrn: NOT DETECTED
Cannabinoid 50 Ng, Ur ~~LOC~~: POSITIVE — AB
Cocaine Metabolite,Ur ~~LOC~~: NOT DETECTED
MDMA (Ecstasy)Ur Screen: NOT DETECTED
Methadone Scn, Ur: NOT DETECTED
Opiate, Ur Screen: NOT DETECTED
PHENCYCLIDINE (PCP) UR S: NOT DETECTED
Tricyclic, Ur Screen: NOT DETECTED

## 2018-09-14 LAB — ETHANOL: Alcohol, Ethyl (B): <10 mg/dL (ref <10)

## 2018-09-14 LAB — CBC
HCT: 42.1 % (ref 39.0–52.0)
Hemoglobin: 14.4 g/dL (ref 13.0–17.0)
MCH: 31.1 pg (ref 26.0–34.0)
MCHC: 34.2 g/dL (ref 30.0–36.0)
MCV: 90.9 fL (ref 80.0–100.0)
Platelets: 302 10*3/uL (ref 150–400)
RBC: 4.63 MIL/uL (ref 4.22–5.81)
RDW: 12.6 % (ref 11.5–15.5)
WBC: 7 10*3/uL (ref 4.0–10.5)
nRBC: 0 % (ref 0.0–0.2)

## 2018-09-14 LAB — URINALYSIS, COMPLETE (UACMP) WITH MICROSCOPIC
Bacteria, UA: NONE SEEN
Bilirubin Urine: NEGATIVE
GLUCOSE, UA: NEGATIVE mg/dL
Ketones, ur: NEGATIVE mg/dL
Leukocytes,Ua: NEGATIVE
Nitrite: NEGATIVE
Protein, ur: NEGATIVE mg/dL
Specific Gravity, Urine: 1.001 — ABNORMAL LOW (ref 1.005–1.030)
Squamous Epithelial / HPF: NONE SEEN (ref 0–5)
WBC, UA: NONE SEEN WBC/hpf (ref 0–5)
pH: 7 (ref 5.0–8.0)
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
## 2018-09-14 NOTE — ED Notes (Signed)
Pt family states that pt is having memory loss and is progressing over the past 6 months but worse 2-3 days - Pt drove to [place] and slept in car when he woke up he did not know where he was and used GPS to get home - tells family things and forgets what he says - walks around the neighborhood at night - short term memory is the most bothersome thing - he was seen in the ED and referred to psych but family not happy with that referral

## 2018-09-14 NOTE — ED Triage Notes (Signed)
Pt to ed with c/o confusion increasing over the past several months.  Pt states he has hx of bipolar and has not been on meds for over a year.  Pt family reports he recently drove to [place] and slept in his car overnight while family did not know where he was, does not sleep well at night, wonders the neighborhood, does not know day or month and is confused to situation often.  Pt has appt with psychiatrist on April 3rd.

## 2018-09-14 NOTE — ED Notes (Signed)
[name] (wife) Cell: [phone] [phone]

## 2018-09-14 NOTE — ED Notes (Signed)
Sandwich tray and beverage provided to pt.

## 2018-09-14 NOTE — ED Notes (Signed)
Pt. Oriented to unit.  Pt. Advised of cameras and safety checks.  Pt. Requested and was given meal tray and drink.  Pt. Has no concerns or questions at this time.  Patients wife [name]) wants to be contacted if patient is moved to different location.

## 2018-09-14 NOTE — ED Notes (Signed)
Patient transported to CT 

## 2018-09-15 ENCOUNTER — Inpatient Hospital Stay
Admission: AD | Admit: 2018-09-15 | Discharge: 2018-09-19 | DRG: 885 | Disposition: A | Discharge status: Home / Self Care | Payer: Medicare HMO | Attending: Psychiatry | Admitting: Psychiatry

## 2018-09-15 ENCOUNTER — Other Ambulatory Visit: Payer: Self-pay

## 2018-09-15 DIAGNOSIS — Z810 Family history of intellectual disabilities: Secondary | ICD-10-CM

## 2018-09-15 DIAGNOSIS — J302 Other seasonal allergic rhinitis: Secondary | ICD-10-CM | POA: Diagnosis present

## 2018-09-15 DIAGNOSIS — F1220 Cannabis dependence, uncomplicated: Secondary | ICD-10-CM | POA: Diagnosis present

## 2018-09-15 DIAGNOSIS — F1210 Cannabis abuse, uncomplicated: Secondary | ICD-10-CM | POA: Diagnosis not present

## 2018-09-15 DIAGNOSIS — E785 Hyperlipidemia, unspecified: Secondary | ICD-10-CM | POA: Diagnosis present

## 2018-09-15 DIAGNOSIS — E669 Obesity, unspecified: Secondary | ICD-10-CM | POA: Diagnosis not present

## 2018-09-15 DIAGNOSIS — Z8349 Family history of other endocrine, nutritional and metabolic diseases: Secondary | ICD-10-CM

## 2018-09-15 DIAGNOSIS — Z9119 Patient's noncompliance with other medical treatment and regimen: Secondary | ICD-10-CM

## 2018-09-15 DIAGNOSIS — F909 Attention-deficit hyperactivity disorder, unspecified type: Secondary | ICD-10-CM | POA: Diagnosis not present

## 2018-09-15 DIAGNOSIS — F09 Unspecified mental disorder due to known physiological condition: Secondary | ICD-10-CM | POA: Diagnosis not present

## 2018-09-15 DIAGNOSIS — Z888 Allergy status to other drugs, medicaments and biological substances status: Secondary | ICD-10-CM

## 2018-09-15 DIAGNOSIS — J449 Chronic obstructive pulmonary disease, unspecified: Secondary | ICD-10-CM | POA: Diagnosis present

## 2018-09-15 DIAGNOSIS — Z91199 Patient's noncompliance with other medical treatment and regimen due to unspecified reason: Secondary | ICD-10-CM

## 2018-09-15 DIAGNOSIS — Z981 Arthrodesis status: Secondary | ICD-10-CM | POA: Diagnosis not present

## 2018-09-15 DIAGNOSIS — Z87891 Personal history of nicotine dependence: Secondary | ICD-10-CM

## 2018-09-15 DIAGNOSIS — F411 Generalized anxiety disorder: Secondary | ICD-10-CM | POA: Diagnosis present

## 2018-09-15 DIAGNOSIS — F17200 Nicotine dependence, unspecified, uncomplicated: Secondary | ICD-10-CM | POA: Diagnosis present

## 2018-09-15 DIAGNOSIS — K219 Gastro-esophageal reflux disease without esophagitis: Secondary | ICD-10-CM | POA: Diagnosis present

## 2018-09-15 DIAGNOSIS — F319 Bipolar disorder, unspecified: Secondary | ICD-10-CM | POA: Diagnosis present

## 2018-09-15 DIAGNOSIS — Z79899 Other long term (current) drug therapy: Secondary | ICD-10-CM | POA: Diagnosis not present

## 2018-09-15 DIAGNOSIS — Z96653 Presence of artificial knee joint, bilateral: Secondary | ICD-10-CM | POA: Diagnosis not present

## 2018-09-15 DIAGNOSIS — R4182 Altered mental status, unspecified: Secondary | ICD-10-CM | POA: Diagnosis not present

## 2018-09-15 DIAGNOSIS — Z8249 Family history of ischemic heart disease and other diseases of the circulatory system: Secondary | ICD-10-CM | POA: Diagnosis not present

## 2018-09-15 DIAGNOSIS — I10 Essential (primary) hypertension: Secondary | ICD-10-CM | POA: Diagnosis present

## 2018-09-15 DIAGNOSIS — F312 Bipolar disorder, current episode manic severe with psychotic features: Secondary | ICD-10-CM | POA: Diagnosis not present

## 2018-09-15 DIAGNOSIS — G4700 Insomnia, unspecified: Secondary | ICD-10-CM | POA: Diagnosis not present

## 2018-09-15 MED ORDER — LISINOPRIL 20 MG PO TABS
20.0000 mg | ORAL_TABLET | Freq: Every day | ORAL | Status: DC
  Administered 2018-09-16 – 2018-09-19 (×4): 20 mg via ORAL
  Filled 2018-09-15 (×4): qty 1

## 2018-09-15 MED ORDER — HYDROCHLOROTHIAZIDE 25 MG PO TABS
25.0000 mg | ORAL_TABLET | Freq: Every day | ORAL | Status: DC
  Administered 2018-09-15: 25 mg via ORAL
  Filled 2018-09-15: qty 1

## 2018-09-15 MED ORDER — MAGNESIUM HYDROXIDE 400 MG/5ML PO SUSP: 30.0000 mL | Freq: Every day | ORAL | Status: DC | PRN

## 2018-09-15 MED ORDER — LISINOPRIL 20 MG PO TABS
20.0000 mg | ORAL_TABLET | Freq: Every day | ORAL | Status: DC
  Administered 2018-09-15: 20 mg via ORAL
  Filled 2018-09-15: qty 1

## 2018-09-15 MED ORDER — ACETAMINOPHEN 325 MG PO TABS: 650.0000 mg | ORAL_TABLET | Freq: Four times a day (QID) | ORAL | Status: DC | PRN

## 2018-09-15 MED ORDER — HYDROCHLOROTHIAZIDE 25 MG PO TABS
25.0000 mg | ORAL_TABLET | Freq: Every day | ORAL | Status: DC
  Administered 2018-09-16 – 2018-09-19 (×4): 25 mg via ORAL
  Filled 2018-09-15 (×4): qty 1

## 2018-09-15 MED ORDER — QUETIAPINE FUMARATE 25 MG PO TABS: 100.0000 mg | ORAL_TABLET | Freq: Three times a day (TID) | ORAL | Status: DC | PRN

## 2018-09-15 MED ORDER — NICOTINE 14 MG/24HR TD PT24
14.0000 mg | MEDICATED_PATCH | Freq: Every day | TRANSDERMAL | Status: DC
  Administered 2018-09-15 – 2018-09-18 (×3): 14 mg via TRANSDERMAL
  Filled 2018-09-15 (×3): qty 1

## 2018-09-15 MED ORDER — ALUM & MAG HYDROXIDE-SIMETH 200-200-20 MG/5ML PO SUSP: 30.0000 mL | ORAL | Status: DC | PRN

## 2018-09-15 MED ORDER — TRAZODONE HCL 50 MG PO TABS
50.0000 mg | ORAL_TABLET | Freq: Every evening | ORAL | Status: DC | PRN
  Administered 2018-09-15 – 2018-09-17 (×3): 50 mg via ORAL
  Filled 2018-09-15 (×3): qty 1
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
## 2018-09-15 NOTE — ED Notes (Signed)
Lunch , juice and a drink provided

## 2018-09-15 NOTE — ED Notes (Addendum)
Attempted to call report - spoke with [facility]  - she will have to call me back  Pt reporting anxiety due to not being admitted to inpt treatment yet

## 2018-09-15 NOTE — ED Notes (Signed)
BEHAVIORAL HEALTH ROUNDING Patient sleeping: No. Patient alert and oriented: yes Behavior appropriate: Yes.  ; If no, describe:  Nutrition and fluids offered: yes Toileting and hygiene offered: Yes  Sitter present: q15 minute observations and security camera monitoring Law enforcement present: Yes  ODS  

## 2018-09-15 NOTE — ED Notes (Signed)
BEHAVIORAL HEALTH ROUNDING Patient sleeping: Yes.   Patient alert and oriented: eyes closed  Appears asleep Behavior appropriate: Yes.  ; If no, describe:  Nutrition and fluids offered: Yes  Toileting and hygiene offered: sleeping Sitter present: q 15 minute observations and security camera monitoring Law enforcement present: yes  ODS 
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
## 2018-09-15 NOTE — ED Notes (Signed)
Pt given breakfast tray

## 2018-09-15 NOTE — Tx Team (Signed)
Initial Treatment Plan 09/15/2018 4:04 PM Ian Newman. FFK:[id number]    PATIENT STRESSORS: Health problems Medication change or noncompliance Substance abuse   PATIENT STRENGTHS: Ability for insight Active sense of humor Financial means Supportive family/friends   PATIENT IDENTIFIED PROBLEMS: Anxiety 09/15/2018  Depression  09/15/2018  Substance Abuse  09/15/2018                 DISCHARGE CRITERIA:  Ability to meet basic life and health needs Adequate post-discharge living arrangements Improved stabilization in mood, thinking, and/or behavior  PRELIMINARY DISCHARGE PLAN: Outpatient therapy Return to previous living arrangement  PATIENT/FAMILY INVOLVEMENT: This treatment plan has been presented to and reviewed with the patient, Ian Newman., and/or family member,  .  The patient and family have been given the opportunity to ask questions and make suggestions.  Leodis Liverpool, RN 09/15/2018, 4:04 PM

## 2018-09-15 NOTE — Plan of Care (Signed)
  Problem: Education: Goal: Knowledge of [facility] Education information/materials will improve Note:  Instructed on [employer] , unit programing  and hand washing . Patient able to verbalize understanding .

## 2018-09-15 NOTE — ED Notes (Signed)
ED Is the patient under IVC or is there intent for IVC: Yes.   Is the patient medically cleared: Yes.   Is there vacancy in the ED BHU: Yes.   Is the population mix appropriate for patient: Yes.   Is the patient awaiting placement in inpatient or outpatient setting: Yes. - awaiting inpatient psychiatric treatment bed to be assigned    Has the patient had a psychiatric consult: Yes.   Survey of unit performed for contraband, proper placement and condition of furniture, tampering with fixtures in bathroom, shower, and each patient room: Yes.  ; Findings:  APPEARANCE/BEHAVIOR Calm and cooperative NEURO ASSESSMENT Orientation: oriented x3  Denies pain Hallucinations: No.None noted (Hallucinations) denies Speech: Normal Gait: normal RESPIRATORY ASSESSMENT Even  Unlabored respirations  CARDIOVASCULAR ASSESSMENT Pulses equal   regular rate  Skin warm and dry   GASTROINTESTINAL ASSESSMENT no GI complaint EXTREMITIES Full ROM  PLAN OF CARE Provide calm/safe environment. Vital signs assessed twice daily. ED BHU Assessment once each 12-hour shift. Collaborate with TTS daily or as condition indicates. Assure the ED provider has rounded once each shift. Provide and encourage hygiene. Provide redirection as needed. Assess for escalating behavior; address immediately and inform ED provider.  Assess family dynamic and appropriateness for visitation as needed: Yes.  ; If necessary, describe findings:  Educate the patient/family about BHU procedures/visitation: Yes.  ; If necessary, describe findings:

## 2018-09-15 NOTE — BHH Suicide Risk Assessment (Signed)
[facility] Admission Suicide Risk Assessment   Nursing information obtained from:  Patient Demographic factors:  Male, Caucasian, Unemployed Current Mental Status:  NA Loss Factors:  Decrease in vocational status, Decline in physical health Historical Factors:  NA Risk Reduction Factors:  Sense of responsibility to family, Living with another person, especially a relative, Positive social support  Total Time spent with patient: 18 minutes Principal Problem: <principal problem not specified> Diagnosis:  Active Problems:   MDD (major depressive disorder)  Subjective Data: Ian Newman is a 58 year old male with a self-reported history of bipolar disorder.  He was brought into the emergency department by his wife due to confusion and wandering aimlessly outside on multiple accounts, to the point of getting lost.  Patient is simplistic in his interactions today,  very concrete.  I attempted to call his wife at [phone] for additional information but she is not able to be contacted at this time.  Patient reports feeling irritable and anxious over the past several weeks.  Tells me that he paces all night because he is very anxious.  Replies "I don't know" when asked to provide details to his yes or no answers.  Maintains that he is "bipolar" like his son who is on [place] and daughter who is on Dietitian.  He is not able to explain any bipolar tendencies such as multiple days of euphoria racing thoughts, impulsivity distractibility and a sleep deficit.  He remarks "I just am."  Indicates that he was on Mellaril more than 30 years ago and cannot recall when he saw last saw psychiatrist.  Daisy Lazar me that he has a upcoming appointment with his psychiatrist in the next few months.  Says that he was on Seroquel for a period of time which helped him with sleep anxiety and mood.  He does indicate that he forgets many things.  Per records, his wife indicates this is been more problematic over the past 6 months.  Patient  denies traumatic injuries to his brain, and his CT of his head was negative for any acute abnormalities yesterday in the emergency department.  Patient denies bouts of depression with no recent or current thoughts of suicide.  Denies have any history of violence.  He denies various forms of delusions or hallucinatory phenomenon.  Denies symptoms of PTSD or OCD.  Reports chronic sleep problems.  Denies any use of drugs but states that he uses alcohol approximately once a month    Continued Clinical Symptoms:  Alcohol Use Disorder Identification Test Final Score (AUDIT): 6 The "Alcohol Use Disorders Identification Test", Guidelines for Use in Primary Care, Second Edition.  World Pharmacologist [facility]). Score between 0-7:  no or low risk or alcohol related problems. Score between 8-15:  moderate risk of alcohol related problems. Score between 16-19:  high risk of alcohol related problems. Score 20 or above:  warrants further diagnostic evaluation for alcohol dependence and treatment.   CLINICAL FACTORS:   Severe Anxiety and/or Agitation Musculoskeletal: Strength & Muscle Tone: decreased Gait & Station: normal Patient leans: Right  Psychiatric Specialty Exam: Physical Exam  ROS  There were no vitals taken for this visit.There is no height or weight on file to calculate BMI.  General Appearance: Casual  Eye Contact:  Good  Speech:  Negative  Volume:  Normal  Mood:  Depressed  Affect:  Appropriate  Thought Process:  Coherent  Orientation:  Negative  Thought Content:  Delusions  Suicidal Thoughts:  No  Homicidal Thoughts:  No  Memory:  Negative  Judgement:  Poor  Insight:  Lacking  Psychomotor Activity:  Normal  Concentration:  Concentration: Fair  Recall:  Poor  Fund of Knowledge:  Poor  Language:  Poor  Akathisia:  No  Handed:  Right  AIMS (if indicated):     Assets:  Housing  ADL's:  Intact  Cognition:  Impaired,  Moderate  Sleep:       Treatment Plan  Summary: Daily contact with patient to assess and evaluate symptoms and progress in treatment  Observation Level/Precautions:  15 minute checks  Laboratory:  CBC  Psychotherapy:    Medications:    Consultations:    Discharge Concerns:    Estimated LOS:  Other:      COGNITIVE FEATURES THAT CONTRIBUTE TO RISK:  Closed-mindedness    SUICIDE RISK:   Minimal: No identifiable suicidal ideation.  Patients presenting with no risk factors but with morbid ruminations; may be classified as minimal risk based on the severity of the depressive symptoms  PLAN OF CARE:  Patient seen and assessed Chart reviewed Laboratories reviewed Initiate suicide precaution Provide the patient with structure and support  Patient was started on Latuda 20 mg yesterday.  His son has done well on this medication.  He may need a more potent medication.  We will need to confirm that he is able to afford Latuda as well.  May consider switching to Seroquel 100 mg at nighttime for sleep.  For the time being, will add this on a as needed basis at 100 mg 3 times a day as needed for anxiety/agitation.  This also may be used for sleep.  He has had good benefit with Seroquel in the past.  Risks benefits side effects and alternatives reviewed including EPS, NMS, weight gain, sedation.  Patient voices agreement understanding. Collateral info will be important for pt.   I certify that inpatient services furnished can reasonably be expected to improve the patient's condition.   Rulon Sera, MD 09/15/2018, 4:40 PM

## 2018-09-15 NOTE — BH Assessment (Signed)
Patient is to be admitted to [facility] by Dr. Daneil Dolin.  Attending Physician will be Dr. Bary Leriche.   Patient has been assigned to room 307, by [facility] Charge Nurse T'Yawn.   ER staff is aware of the admission:  Dr. Cinda Quest, ER MD   Amy T., Patient's Nurse   [place], Patient Access.

## 2018-09-15 NOTE — BHH Group Notes (Signed)
[facility] Group Notes:  (Nursing/MHT/Case Management/Adjunct)  Date:  09/15/2018  Time:  3:42 PM  Type of Therapy:  Relaxation Group  Participation Level:  Did Not Attend   Adela Lank [facility] 09/15/2018, 3:42 PM

## 2018-09-15 NOTE — ED Notes (Signed)
Pt. Up using bathroom, pt. Returned to room with steady gait. 
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
## 2018-09-15 NOTE — ED Provider Notes (Signed)
-----------------------------------------   7:22 AM on 09/15/2018 -----------------------------------------   Blood pressure 130/87, pulse 92, temperature 98.3 F (36.8 C), temperature source Oral, resp. rate 18, weight 115.2 kg, SpO2 97 %.  The patient is calm and cooperative at this time.  There have been no acute events since the last update.  Patient's home medications have been ordered.  Awaiting disposition plan from Behavioral Medicine team.   Paulette Blanch, MD 09/15/18 [phone]

## 2018-09-15 NOTE — ED Notes (Signed)
Patient observed lying in bed with eyes closed  Even, unlabored respirations observed   NAD pt appears to be sleeping  I will continue to monitor along with every 15 minute visual observations and ongoing security camera monitoring    

## 2018-09-15 NOTE — ED Notes (Signed)

## 2018-09-16 ENCOUNTER — Encounter: Payer: Self-pay | Admitting: Psychiatry

## 2018-09-16 DIAGNOSIS — F312 Bipolar disorder, current episode manic severe with psychotic features: Principal | ICD-10-CM

## 2018-09-16 MED ORDER — DIVALPROEX SODIUM 500 MG PO DR TAB
500.0000 mg | DELAYED_RELEASE_TABLET | Freq: Three times a day (TID) | ORAL | Status: DC
  Administered 2018-09-16 – 2018-09-19 (×10): 500 mg via ORAL
  Filled 2018-09-16 (×10): qty 1

## 2018-09-16 MED ORDER — RISPERIDONE 1 MG PO TABS
2.0000 mg | ORAL_TABLET | Freq: Two times a day (BID) | ORAL | Status: DC
  Administered 2018-09-16 – 2018-09-19 (×7): 2 mg via ORAL
  Filled 2018-09-16 (×7): qty 2

## 2018-09-16 NOTE — Progress Notes (Signed)
Recreation Therapy Notes  INPATIENT RECREATION THERAPY ASSESSMENT  Patient Details Name: Imre Vecchione. MRN: [id number] DOB: Jun 09, 1961 Today's Date: 09/16/2018       Information Obtained From: Patient  Able to Participate in Assessment/Interview: Yes  Patient Presentation: Responsive  Reason for Admission (Per Patient): Active Symptoms, Other (Comments)(Anxiety)  Patient Stressors:    Coping Skills:   Talk  Leisure Interests (2+):  Exercise - Walking, Music - Play instrument, Psychologist, prison and probation services of Recreation/Participation: Monthly  Awareness of Community Resources:     [employer]:     Current Use:    If no, Barriers?:    Expressed Interest in [place] of Residence:  Insurance underwriter  Patient Main Form of Transportation: Musician  Patient Strengths:  I do not know  Patient Identified Areas of Improvement:  Stop forgetting things  Patient Goal for Hospitalization:  To get out of here by the end of the week  Current SI (including self-harm):  No  Current HI:  No  Current AVH: No  Staff Intervention Plan: Group Attendance, Collaborate with Interdisciplinary Treatment Team  Consent to Intern Participation: N/A  Javious Hallisey 09/16/2018, 3:42 PM

## 2018-09-16 NOTE — Progress Notes (Signed)
D: Patient has been pleasant and cooperative. Denies SI, HI and AV hallucinations. Requested and received prn medication for sleep. Mood is pleasant, affect is appropriate. Voices no complaints. Contracts for safety A:Continue to monitor for safety. R: Safety maintained
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
## 2018-09-16 NOTE — BHH Group Notes (Signed)
LCSW Group Therapy Note   09/16/2018 1:26 PM   Type of Therapy and Topic:  Group Therapy:  Overcoming Obstacles   Participation Level:  Did Not Attend   Description of Group:    In this group patients will be encouraged to explore what they see as obstacles to their own wellness and recovery. They will be guided to discuss their thoughts, feelings, and behaviors related to these obstacles. The group will process together ways to cope with barriers, with attention given to specific choices patients can make. Each patient will be challenged to identify changes they are motivated to make in order to overcome their obstacles. This group will be process-oriented, with patients participating in exploration of their own experiences as well as giving and receiving support and challenge from other group members.   Therapeutic Goals: 1. Patient will identify personal and current obstacles as they relate to admission. 2. Patient will identify barriers that currently interfere with their wellness or overcoming obstacles.  3. Patient will identify feelings, thought process and behaviors related to these barriers. 4. Patient will identify two changes they are willing to make to overcome these obstacles:      Summary of Patient Progress x     Therapeutic Modalities:   Cognitive Behavioral Therapy Solution Focused Therapy Motivational Interviewing Relapse Prevention Therapy  Evalina [place], MSW, LCSW Clinical Social Work 09/16/2018 1:26 PM

## 2018-09-16 NOTE — Progress Notes (Signed)
Recreation Therapy Notes  Date: 09/16/2018  Time: 9:30 am   Location: Craft room   Behavioral response: N/A   Intervention Topic: Self-Care  Discussion/Intervention: Patient did not attend group.   Clinical Observations/Feedback:  Patient did not attend group.   Levell Tavano LRT/CTRS        Cortny Bambach 09/16/2018 10:29 AM
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
## 2018-09-16 NOTE — Tx Team (Addendum)
Interdisciplinary Treatment and Diagnostic Plan Update  09/16/2018 Time of Session: 1030AM Ian Newman. MRN: [id number]  Principal Diagnosis: Bipolar I disorder, current or most recent episode manic, with psychotic features ([place])  Secondary Diagnoses: Principal Problem:   Bipolar I disorder, current or most recent episode manic, with psychotic features ([place]) Active Problems:   Essential hypertension   Cannabis use disorder, moderate, dependence ([place])   Tobacco use disorder   Current Medications:  Current Facility-Administered Medications  Medication Dose Route Frequency Provider Last Rate Last Dose  . acetaminophen (TYLENOL) tablet 650 mg  650 mg Oral Q6H PRN Rulon Sera, MD      . alum & mag hydroxide-simeth (MAALOX/MYLANTA) 200-200-20 MG/5ML suspension 30 mL  30 mL Oral Q4H PRN Rulon Sera, MD      . divalproex (DEPAKOTE) DR tablet 500 mg  500 mg Oral Q8H Pucilowska, Jolanta B, MD   500 mg at 09/16/18 0624  . hydrochlorothiazide (HYDRODIURIL) tablet 25 mg  25 mg Oral Daily Rulon Sera, MD   25 mg at 09/16/18 0831  . lisinopril (PRINIVIL,ZESTRIL) tablet 20 mg  20 mg Oral Daily Rulon Sera, MD   20 mg at 09/16/18 0830  . magnesium hydroxide (MILK OF MAGNESIA) suspension 30 mL  30 mL Oral Daily PRN Rulon Sera, MD      . nicotine (NICODERM CQ - dosed in mg/24 hours) patch 14 mg  14 mg Transdermal Daily Rulon Sera, MD   14 mg at 09/15/18 1736  . risperiDONE (RISPERDAL) tablet 2 mg  2 mg Oral BID Pucilowska, Jolanta B, MD   2 mg at 09/16/18 0830  . traZODone (DESYREL) tablet 50 mg  50 mg Oral QHS PRN Rulon Sera, MD   50 mg at 09/15/18 2141   PTA Medications: Medications Prior to Admission  Medication Sig Dispense Refill Last Dose  . hydrochlorothiazide (HYDRODIURIL) 25 MG tablet Take 1 tablet (25 mg total) by mouth daily. 90 tablet 3 09/14/2018 at 0800  . lisinopril (PRINIVIL,ZESTRIL) 20 MG tablet Take 20 mg by mouth daily.   09/14/2018 at 0800  . Multiple Vitamin  (MULTIVITAMIN) tablet Take 1 tablet by mouth daily.   09/14/2018 at 0800    Patient Stressors: Health problems Medication change or noncompliance Substance abuse  Patient Strengths: Ability for insight Active sense of humor Financial means Supportive family/friends  Treatment Modalities: Medication Management, Group therapy, Case management,  1 to 1 session with clinician, Psychoeducation, Recreational therapy.   Physician Treatment Plan for Primary Diagnosis: Bipolar I disorder, current or most recent episode manic, with psychotic features ([place]) Long Term Goal(s): Improvement in symptoms so as ready for discharge Improvement in symptoms so as ready for discharge   Short Term Goals: Ability to demonstrate self-control will improve Ability to identify and develop effective coping behaviors will improve  Medication Management: Evaluate patient's response, side effects, and tolerance of medication regimen.  Therapeutic Interventions: 1 to 1 sessions, Unit Group sessions and Medication administration.  Evaluation of Outcomes: Not Met  Physician Treatment Plan for Secondary Diagnosis: Principal Problem:   Bipolar I disorder, current or most recent episode manic, with psychotic features ([place]) Active Problems:   Essential hypertension   Cannabis use disorder, moderate, dependence ([place])   Tobacco use disorder  Long Term Goal(s): Improvement in symptoms so as ready for discharge Improvement in symptoms so as ready for discharge   Short Term Goals: Ability to demonstrate self-control will improve Ability to identify and develop effective coping behaviors will improve  Medication Management: Evaluate patient's response, side effects, and tolerance of medication regimen.  Therapeutic Interventions: 1 to 1 sessions, Unit Group sessions and Medication administration.  Evaluation of Outcomes: Not Met   RN Treatment Plan for Primary Diagnosis: Bipolar I disorder, current or most  recent episode manic, with psychotic features ([place]) Long Term Goal(s): Knowledge of disease and therapeutic regimen to maintain health will improve  Short Term Goals: Ability to participate in decision making will improve and Ability to identify and develop effective coping behaviors will improve  Medication Management: RN will administer medications as ordered by provider, will assess and evaluate patient's response and provide education to patient for prescribed medication. RN will report any adverse and/or side effects to prescribing provider.  Therapeutic Interventions: 1 on 1 counseling sessions, Psychoeducation, Medication administration, Evaluate responses to treatment, Monitor vital signs and CBGs as ordered, Perform/monitor CIWA, COWS, AIMS and Fall Risk screenings as ordered, Perform wound care treatments as ordered.  Evaluation of Outcomes: Not Met   LCSW Treatment Plan for Primary Diagnosis: Bipolar I disorder, current or most recent episode manic, with psychotic features ([place]) Long Term Goal(s): Safe transition to appropriate next level of care at discharge, Engage patient in therapeutic group addressing interpersonal concerns.  Short Term Goals: Engage patient in aftercare planning with referrals and resources, Increase emotional regulation, Facilitate acceptance of mental health diagnosis and concerns and Increase skills for wellness and recovery  Therapeutic Interventions: Assess for all discharge needs, 1 to 1 time with Social worker, Explore available resources and support systems, Assess for adequacy in community support network, Educate family and significant other(s) on suicide prevention, Complete Psychosocial Assessment, Interpersonal group therapy.  Evaluation of Outcomes: Not Met   Progress in Treatment: Attending groups: No. Pt new to unit Participating in groups: No. Taking medication as prescribed: Yes. Toleration medication: Yes. Family/Significant other  contact made: No, will contact:  Pts wife Ian Newman Patient understands diagnosis: Yes. Discussing patient identified problems/goals with staff: Yes. Medical problems stabilized or resolved: Yes. Denies suicidal/homicidal ideation: Yes. Issues/concerns per patient self-inventory: No. Other: n/a  New problem(s) identified: No, Describe:  none  New Short Term/Long Term Goal(s):  medication management for mood stabilization;  development of comprehensive mental wellness/sobriety plan.   Patient Goals:  "To get out of here by the end of the week"  Discharge Plan or Barriers: SPE pamphlet, Mobile Crisis information, and AA/NA information provided to patient for additional community support and resources. CSW assessing for appropriate referrals.   Reason for Continuation of Hospitalization: Anxiety Medication stabilization  Estimated Length of Stay: 5-7 days  Recreational Therapy: Patient Stressors: N/A Patient Goal: Patient will engage in groups without prompting or encouragement from LRT x3 group sessions within 5 recreation therapy group sessions  Attendees: Patient: Ian Newman 09/16/2018 11:23 AM  Physician: Dr Bary Leriche MD 09/16/2018 11:23 AM  Nursing:  09/16/2018 11:23 AM  RN Care Manager: 09/16/2018 11:23 AM  Social Worker: Minette Brine Moton LCSW 09/16/2018 11:23 AM  Recreational Therapist: Roanna Epley CTRS LRT 09/16/2018 11:23 AM  Other: Sanjuana Kava LCSW 09/16/2018 11:23 AM  Other:  09/16/2018 11:23 AM  Other: 09/16/2018 11:23 AM    Scribe for Treatment Team: Mariann Laster [place], LCSW 09/16/2018 11:23 AM

## 2018-09-16 NOTE — Plan of Care (Signed)
Patient is present in the milieu, alert and oriented to himself. Still has some confusion when communicating with this Probation officer. Patient has been observed pacing the hallway this shift, has not voiced having any anxiety at this time. Affect and mood is pleasant and cooperative. States, "I didn't sleep very well last night." Patient was excused from morning group session but encouraged to attend afternoon group, patient agreed to go." Observed in the dayroom for meals and is complaint with his medications. Milieu remains safe with q 15 minute safety checks. Will continue to monitor.

## 2018-09-16 NOTE — Plan of Care (Signed)
D: Patient has been pleasant and cooperative. Denies SI, HI and AV hallucinations. Requested and received prn medication for sleep. Mood is pleasant, affect is appropriate. Voices no complaints. Contracts for safety A:Continue to monitor for safety. R: Safety maintained

## 2018-09-16 NOTE — [facility] Suicide Risk Assessment (Signed)
[place] INPATIENT:  Family/Significant Other Suicide Prevention Education  Suicide Prevention Education:  Contact Attempts:Joann [place]ville Silos, wife [phone] and [phone] has been identified by the patient as the family member/significant other with whom the patient will be residing, and identified as the person(s) who will aid the patient in the event of a mental health crisis.  With written consent from the patient, two attempts were made to provide suicide prevention education, prior to and/or following the patient's discharge.  We were unsuccessful in providing suicide prevention education.  A suicide education pamphlet was given to the patient to share with family/significant other.  Date and time of first attempt: 09/16/2018 at 1:54 Date and time of second attempt:  CSW left a confidential voicemail at [phone] requesting a call back. CSW was unable to leave a message at [phone] as it stated unable to leave a message try to call back at a later time.   Mariann Laster Calieb Lichtman 09/16/2018, 1:52 PM

## 2018-09-17 NOTE — Progress Notes (Signed)
Recreation Therapy Notes  Date: 09/17/2018  Time: 9:30 am   Location: Craft room   Behavioral response: N/A   Intervention Topic: Team work  Discussion/Intervention: Patient did not attend group.   Clinical Observations/Feedback:  Patient did not attend group.   Yariela Tison LRT/CTRS        Emran Molzahn 09/17/2018 10:44 AM

## 2018-09-17 NOTE — Progress Notes (Signed)
D - Patient was in the day room upon arrival to the unit. Patient was pleasant during assessment and medication administration per MD orders. Patient denies SI/HI/AVH, pain, anxiety and depression with this Probation officer. Patient stated to this writer, "I had a pretty good day." Patient would answer questions but wasn't minimal with this Probation officer.   A - Patient was compliant with medication administration per MD orders and procedures on the unit. Patient given education. Patient given support and encouragement to be active in his treatment plan. Patient informed to let staff know if there are any issues or problems on the unit.   R - Patient being monitored Q 15 minutes for safety per unit protocol. Patient remains safe on the unit.

## 2018-09-17 NOTE — Progress Notes (Signed)
D: Patient appears to be improving with his treatment.  He is compliant with his medications.  He denies any thoughts of self harm.  Patient has been interacting well with staff and his peers.    A: Continue to monitor medication management and MD orders.  Safety checks completed every 15 minutes per protocol.  Offer support and encouragement as needed.  R: Patient is receptive to staff; his behavior is appropriate.

## 2018-09-17 NOTE — Plan of Care (Signed)
Patient stated to this writer that he had a pretty good day and that he can feel his anxiety still but it is getting better.   Problem: Self-Concept: Goal: Level of anxiety will decrease Outcome: Progressing

## 2018-09-17 NOTE — [facility] Group Notes (Signed)
Feelings Around Diagnosis 09/17/2018 1PM  Type of Therapy/Topic:  Group Therapy:  Feelings about Diagnosis  Participation Level:  Did Not Attend   Description of Group:   This group will allow patients to explore their thoughts and feelings about diagnoses they have received. Patients will be guided to explore their level of understanding and acceptance of these diagnoses. Facilitator will encourage patients to process their thoughts and feelings about the reactions of others to their diagnosis and will guide patients in identifying ways to discuss their diagnosis with significant others in their lives. This group will be process-oriented, with patients participating in exploration of their own experiences, giving and receiving support, and processing challenge from other group members.   Therapeutic Goals: 1. Patient will demonstrate understanding of diagnosis as evidenced by identifying two or more symptoms of the disorder 2. Patient will be able to express two feelings regarding the diagnosis 3. Patient will demonstrate their ability to communicate their needs through discussion and/or role play  Summary of Patient Progress:       Therapeutic Modalities:   Cognitive Behavioral Therapy Brief Therapy Feelings Identification    Yvette Rack, LCSW 09/17/2018 2:23 PM
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
## 2018-09-17 NOTE — Progress Notes (Signed)
D: Patient has been visible in the milieu.  He is pleasant with staff.  He is interacting well with staff.  He denies any thoughts of self harm.  Patient continues to ruminate about his "memory problems."  Patient is a possible discharge for tomorrow.  A: Continue to monitor medication management and MD orders.  Safety checks completed every 15 minutes per protocol.  Offer support and encouragement as needed.  R: Patient is receptive to staff; his behavior is appropriate.

## 2018-09-18 DIAGNOSIS — Z9119 Patient's noncompliance with other medical treatment and regimen: Secondary | ICD-10-CM

## 2018-09-18 DIAGNOSIS — Z91199 Patient's noncompliance with other medical treatment and regimen due to unspecified reason: Secondary | ICD-10-CM

## 2018-09-18 MED ORDER — HYDROCHLOROTHIAZIDE 25 MG PO TABS: 25.0000 mg | ORAL_TABLET | Freq: Every day | ORAL | 3 refills | Status: DC

## 2018-09-18 MED ORDER — DIVALPROEX SODIUM 500 MG PO DR TAB: 500.0000 mg | DELAYED_RELEASE_TABLET | Freq: Three times a day (TID) | ORAL | 1 refills | Status: DC

## 2018-09-18 MED ORDER — TEMAZEPAM 15 MG PO CAPS: 15.0000 mg | ORAL_CAPSULE | Freq: Every day | ORAL | 0 refills | Status: DC

## 2018-09-18 MED ORDER — TEMAZEPAM 15 MG PO CAPS
15.0000 mg | ORAL_CAPSULE | Freq: Every day | ORAL | Status: DC
  Administered 2018-09-18: 15 mg via ORAL
  Filled 2018-09-18: qty 1

## 2018-09-18 MED ORDER — RISPERIDONE 2 MG PO TABS: 2.0000 mg | ORAL_TABLET | Freq: Two times a day (BID) | ORAL | 1 refills | Status: DC

## 2018-09-18 MED ORDER — LISINOPRIL 20 MG PO TABS: 20.0000 mg | ORAL_TABLET | Freq: Every day | ORAL | 1 refills | Status: DC

## 2018-09-18 NOTE — [facility] Suicide Risk Assessment (Signed)
[facility] INPATIENT:  Family/Significant Other Suicide Prevention Education  Suicide Prevention Education:  Contact Attempts: Orlena Sheldon, wife [phone] has been identified by the patient as the family member/significant other with whom the patient will be residing, and identified as the person(s) who will aid the patient in the event of a mental health crisis.  With written consent from the patient, two attempts were made to provide suicide prevention education, prior to and/or following the patient's discharge.  We were unsuccessful in providing suicide prevention education.  A suicide education pamphlet was given to the patient to share with family/significant other.  Date and time of first attempt: 09/16/2018 at 1:54 Date and time of second attempt: 09/18/2018 at 3:34  CSW left a HIPAA compliant voicemail requesting a call back.  [place] MSW LCSW 09/18/2018, 3:32 PM

## 2018-09-18 NOTE — Plan of Care (Signed)
Patient is alert and oriented X 3. Denies SI, HI and AVH. Patient complains of being irritable and not sleeping well during the night. Patient isolates to his room this morning, trying to get some rest; patient forwards little; guarded during assessment. No self injurious behaviors noted. Problem: Education: Goal: Knowledge of [place] [facility] Education information/materials will improve Outcome: Progressing   Problem: Coping: Goal: Ability to verbalize frustrations and anger appropriately will improve Outcome: Progressing Goal: Ability to demonstrate self-control will improve Outcome: Progressing   Problem: Safety: Goal: Periods of time without injury will increase Outcome: Progressing   Problem: Self-Concept: Goal: Ability to identify factors that promote anxiety will improve Outcome: Progressing Goal: Ability to modify response to factors that promote anxiety will improve Outcome: Progressing
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
## 2018-09-18 NOTE — BHH Suicide Risk Assessment (Signed)
[facility] Discharge Suicide Risk Assessment   Principal Problem: Bipolar I disorder, current or most recent episode manic, with psychotic features [facility]) Discharge Diagnoses: Principal Problem:   Bipolar I disorder, current or most recent episode manic, with psychotic features ([place]) Active Problems:   Essential hypertension   Tobacco use disorder   Noncompliance   Total Time spent with patient: 20 minutes  Musculoskeletal: Strength & Muscle Tone: within normal limits Gait & Station: normal Patient leans: N/A  Psychiatric Specialty Exam: Review of Systems  Neurological: Negative.   Psychiatric/Behavioral: The patient has insomnia.   All other systems reviewed and are negative.   Blood pressure (!) 128/94, pulse (!) 105, temperature 98 F (36.7 C), temperature source Oral, resp. rate 18, height 6' (1.829 m), weight 107 kg, SpO2 99 %.Body mass index is 32.01 kg/m.  General Appearance: Casual  Eye Contact::  Good  Speech:  Clear and Coherent409  Volume:  Normal  Mood:  Euthymic  Affect:  Flat  Thought Process:  Goal Directed and Descriptions of Associations: Intact  Orientation:  Full (Time, Place, and Person)  Thought Content:  WDL  Suicidal Thoughts:  No  Homicidal Thoughts:  No  Memory:  Immediate;   Fair Recent;   Fair Remote;   Fair  Judgement:  Impaired  Insight:  Shallow  Psychomotor Activity:  Normal  Concentration:  Fair  Recall:  [place]  Language: Fair  Akathisia:  No  Handed:  Right  AIMS (if indicated):     Assets:  Communication Skills Desire for Improvement Financial Resources/Insurance Housing Physical Health Resilience Social Support  Sleep:  Number of Hours: 7.45  Cognition: WNL  ADL's:  Intact   Mental Status Per Nursing Assessment::   On Admission:  NA  Demographic Factors:  Male and Caucasian  Loss Factors: NA  Historical Factors: Impulsivity  Risk Reduction Factors:   Sense of responsibility to family, Living  with another person, especially a relative and Positive social support  Continued Clinical Symptoms:  Bipolar Disorder:   Mixed State  Cognitive Features That Contribute To Risk:  None    Suicide Risk:  Minimal: No identifiable suicidal ideation.  Patients presenting with no risk factors but with morbid ruminations; may be classified as minimal risk based on the severity of the depressive symptoms  Follow-up [facility] Follow up on 10/18/2018.   Specialty:  Behavioral Health Why:  You have a new patient appointment scheduled for Friday 10/18/18 at 11AM With Dr Shea Evans. Thank you! Contact information: [address] [place] [phone]          Plan Of Care/Follow-up recommendations:  Activity:  as tolerated Diet:  low sodiu heart healthy Other:  keep follow up appointments  Orson Slick, MD 09/19/2018, 9:09 AM

## 2018-09-18 NOTE — Progress Notes (Signed)
Patient slept through the night uninterrupted and without distress, safety is maintained.

## 2018-09-18 NOTE — BHH Group Notes (Signed)
LCSW Group Therapy Note  09/18/2018 12:35 PM  Type of Therapy/Topic:  Group Therapy:  Emotion Regulation  Participation Level:  Active   Description of Group:   The purpose of this group is to assist patients in learning to regulate negative emotions and experience positive emotions. Patients will be guided to discuss ways in which they have been vulnerable to their negative emotions. These vulnerabilities will be juxtaposed with experiences of positive emotions or situations, and patients will be challenged to use positive emotions to combat negative ones. Special emphasis will be placed on coping with negative emotions in conflict situations, and patients will process healthy conflict resolution skills.  Therapeutic Goals: 1. Patient will identify two positive emotions or experiences to reflect on in order to balance out negative emotions 2. Patient will label two or more emotions that they find the most difficult to experience 3. Patient will demonstrate positive conflict resolution skills through discussion and/or role plays  Summary of Patient Progress: Pt was respectful and appropriate in group. Pt reported that he was anxious prior to coming to the hospital and stated that he was walking around the block continuously to help with his anxiety. Pt reported coping with his emotions by surrounding hisself with positive people.    Therapeutic Modalities:   Cognitive Behavioral Therapy Feelings Identification Dialectical Behavioral Therapy   Evalina Field, MSW, LCSW Clinical Social Work 09/18/2018 12:35 PM

## 2018-09-18 NOTE — BHH Group Notes (Signed)
[facility] Group Notes:  (Nursing/MHT/Case Management/Adjunct)  Date:  09/18/2018  Time:  9:09 PM  Type of Therapy:  Group Therapy  Participation Level:  Active  Participation Quality:  Appropriate  Affect:  Appropriate  Cognitive:  Appropriate  Insight:  Appropriate  Engagement in Group:  Engaged  Modes of Intervention:  Discussion  Summary of Progress/Problems:  Ian Newman 09/18/2018, 9:09 PM
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
## 2018-09-18 NOTE — Progress Notes (Signed)
Recreation Therapy Notes  Date: 09/18/2018  Time: 9:30 am   Location: Craft room   Behavioral response: N/A   Intervention Topic: Coping Skills  Discussion/Intervention: Patient did not attend group.   Clinical Observations/Feedback:  Patient did not attend group.   Pratham Cassatt LRT/CTRS         Luretha Eberly 09/18/2018 11:36 AM

## 2018-09-19 ENCOUNTER — Other Ambulatory Visit: Payer: Self-pay | Admitting: Psychiatry

## 2018-09-19 LAB — TSH: TSH: 2.07 u[IU]/mL (ref 0.350–4.500)

## 2018-09-19 LAB — AMMONIA: Ammonia: 23 umol/L (ref 9–35)

## 2018-09-19 LAB — HEMOGLOBIN A1C
Hgb A1c MFr Bld: 5.5 % (ref 4.8–5.6)
Mean Plasma Glucose: 111.15 mg/dL

## 2018-09-19 LAB — LIPID PANEL
Cholesterol: 210 mg/dL — ABNORMAL HIGH (ref 0–200)
HDL: 35 mg/dL — ABNORMAL LOW (ref >40)
LDL Cholesterol: 139 mg/dL — ABNORMAL HIGH (ref 0–99)
Total CHOL/HDL Ratio: 6 RATIO
Triglycerides: 181 mg/dL — ABNORMAL HIGH (ref <150)
VLDL: 36 mg/dL (ref 0–40)

## 2018-09-19 LAB — VALPROIC ACID LEVEL: Valproic Acid Lvl: 58 ug/mL (ref 50.0–100.0)

## 2018-09-19 NOTE — Progress Notes (Signed)
Recreation Therapy Notes  INPATIENT RECREATION TR PLAN  Patient Details Name: Ian Newman. MRN: [id number] DOB: 1960/08/21 Today's Date: 09/19/2018  Rec Therapy Plan Is patient appropriate for Therapeutic Recreation?: Yes Treatment times per week: at least 3 Estimated Length of Stay: 5-7 days TR Treatment/Interventions: Group participation (Comment)  Discharge Criteria Pt will be discharged from therapy if:: Discharged Treatment plan/goals/alternatives discussed and agreed upon by:: Patient/family  Discharge Summary Short term goals set: Patient will engage in groups without prompting or encouragement from LRT x3 group sessions within 5 recreation therapy group sessions Short term goals met: Not met Reason goals not met: Patient did not attend any groups Therapeutic equipment acquired: N/A Reason patient discharged from therapy: Discharge from hospital Pt/family agrees with progress & goals achieved: Yes Date patient discharged from therapy: 09/19/18   Oseph Imburgia 09/19/2018, 11:20 AM

## 2018-09-19 NOTE — Progress Notes (Signed)
  [facility] Adult Case Management Discharge Plan :  Will you be returning to the same living situation after discharge:  Yes,  home At discharge, do you have transportation home?: Yes,  wife Do you have the ability to pay for your medications: Yes,  [employer] medicare  Release of information consent forms completed and in the chart;   Patient to Follow up at: Follow-up Information    [facility] Follow up on 10/18/2018.   Specialty:  Behavioral Health Why:  You have a new patient appointment scheduled for Friday 10/18/18 at 11AM With Dr Shea Evans. Thank you! Contact information: [place] [phone]          Next level of care provider has access to [place] and Suicide Prevention discussed: Yes,  SPE completed with pt  Have you used any form of tobacco in the last 30 days? (Cigarettes, Smokeless Tobacco, Cigars, and/or Pipes): Yes  Has patient been referred to the Quitline?: Patient refused referral  Patient has been referred for addiction treatment: N/A  Delfin Edis, LCSW 09/19/2018, 8:56 AM

## 2018-10-01 DIAGNOSIS — I10 Essential (primary) hypertension: Secondary | ICD-10-CM | POA: Diagnosis not present

## 2018-10-01 DIAGNOSIS — Z125 Encounter for screening for malignant neoplasm of prostate: Secondary | ICD-10-CM | POA: Diagnosis not present

## 2018-10-04 DIAGNOSIS — R310 Gross hematuria: Secondary | ICD-10-CM | POA: Diagnosis not present

## 2018-10-09 ENCOUNTER — Other Ambulatory Visit: Payer: Self-pay

## 2018-10-09 ENCOUNTER — Emergency Department
Admission: EM | Admit: 2018-10-09 | Discharge: 2018-10-09 | Disposition: A | Discharge status: Home / Self Care | Payer: Medicare HMO | Attending: Emergency Medicine | Admitting: Emergency Medicine

## 2018-10-09 ENCOUNTER — Encounter: Payer: Self-pay | Admitting: Emergency Medicine

## 2018-10-09 DIAGNOSIS — F419 Anxiety disorder, unspecified: Secondary | ICD-10-CM | POA: Diagnosis present

## 2018-10-09 DIAGNOSIS — J449 Chronic obstructive pulmonary disease, unspecified: Secondary | ICD-10-CM | POA: Diagnosis not present

## 2018-10-09 DIAGNOSIS — Z96653 Presence of artificial knee joint, bilateral: Secondary | ICD-10-CM | POA: Diagnosis not present

## 2018-10-09 DIAGNOSIS — Z87891 Personal history of nicotine dependence: Secondary | ICD-10-CM | POA: Diagnosis not present

## 2018-10-09 DIAGNOSIS — F3132 Bipolar disorder, current episode depressed, moderate: Secondary | ICD-10-CM | POA: Diagnosis not present

## 2018-10-09 DIAGNOSIS — Z79899 Other long term (current) drug therapy: Secondary | ICD-10-CM | POA: Insufficient documentation

## 2018-10-09 DIAGNOSIS — F312 Bipolar disorder, current episode manic severe with psychotic features: Secondary | ICD-10-CM | POA: Diagnosis not present

## 2018-10-09 DIAGNOSIS — I10 Essential (primary) hypertension: Secondary | ICD-10-CM | POA: Diagnosis not present

## 2018-10-09 DIAGNOSIS — F319 Bipolar disorder, unspecified: Secondary | ICD-10-CM | POA: Diagnosis present

## 2018-10-09 DIAGNOSIS — F3112 Bipolar disorder, current episode manic without psychotic features, moderate: Secondary | ICD-10-CM

## 2018-10-09 LAB — ETHANOL

## 2018-10-09 LAB — URINE DRUG SCREEN, QUALITATIVE (ARMC ONLY)
Amphetamines, Ur Screen: NOT DETECTED
Barbiturates, Ur Screen: NOT DETECTED
Benzodiazepine, Ur Scrn: POSITIVE — AB
COCAINE METABOLITE, UR ~~LOC~~: NOT DETECTED
Cannabinoid 50 Ng, Ur ~~LOC~~: POSITIVE — AB
MDMA (Ecstasy)Ur Screen: NOT DETECTED
Methadone Scn, Ur: NOT DETECTED
OPIATE, UR SCREEN: NOT DETECTED
Phencyclidine (PCP) Ur S: NOT DETECTED
Tricyclic, Ur Screen: NOT DETECTED

## 2018-10-09 LAB — CBC WITH DIFFERENTIAL/PLATELET
Abs Immature Granulocytes: 0.03 10*3/uL (ref 0.00–0.07)
Basophils Absolute: 0 10*3/uL (ref 0.0–0.1)
Basophils Relative: 1 %
Eosinophils Absolute: 0.3 10*3/uL (ref 0.0–0.5)
Eosinophils Relative: 3 %
HCT: 39.8 % (ref 39.0–52.0)
HEMOGLOBIN: 13.2 g/dL (ref 13.0–17.0)
Immature Granulocytes: 0 %
Lymphocytes Relative: 20 %
Lymphs Abs: 1.7 10*3/uL (ref 0.7–4.0)
MCH: 30.8 pg (ref 26.0–34.0)
MCHC: 33.2 g/dL (ref 30.0–36.0)
MCV: 93 fL (ref 80.0–100.0)
MONOS PCT: 11 %
Monocytes Absolute: 0.9 10*3/uL (ref 0.1–1.0)
Neutro Abs: 5.3 10*3/uL (ref 1.7–7.7)
Neutrophils Relative %: 65 %
Platelets: 260 10*3/uL (ref 150–400)
RBC: 4.28 MIL/uL (ref 4.22–5.81)
RDW: 12.4 % (ref 11.5–15.5)
WBC: 8.2 10*3/uL (ref 4.0–10.5)
nRBC: 0 % (ref 0.0–0.2)

## 2018-10-09 LAB — COMPREHENSIVE METABOLIC PANEL
ALK PHOS: 61 U/L (ref 38–126)
ALT: 22 U/L (ref 0–44)
AST: 24 U/L (ref 15–41)
Albumin: 3.9 g/dL (ref 3.5–5.0)
Anion gap: 7 (ref 5–15)
BUN: 11 mg/dL (ref 6–20)
CO2: 28 mmol/L (ref 22–32)
Calcium: 9.4 mg/dL (ref 8.9–10.3)
Chloride: 102 mmol/L (ref 98–111)
Creatinine, Ser: 0.79 mg/dL (ref 0.61–1.24)
GFR calc Af Amer: >60 mL/min (ref >60)
Glucose, Bld: 114 mg/dL — ABNORMAL HIGH (ref 70–99)
Potassium: 4.3 mmol/L (ref 3.5–5.1)
Sodium: 137 mmol/L (ref 135–145)
Total Bilirubin: 0.6 mg/dL (ref 0.3–1.2)
Total Protein: 7.1 g/dL (ref 6.5–8.1)

## 2018-10-09 LAB — SALICYLATE LEVEL: Salicylate Lvl: <7 mg/dL (ref 2.8–30.0)

## 2018-10-09 LAB — ACETAMINOPHEN LEVEL: Acetaminophen (Tylenol), Serum: <10 ug/mL — ABNORMAL LOW (ref 10–30)

## 2018-10-09 LAB — VALPROIC ACID LEVEL: Valproic Acid Lvl: <10 ug/mL — ABNORMAL LOW (ref 50.0–100.0)

## 2018-10-09 LAB — LIPASE, BLOOD: Lipase: 34 U/L (ref 11–51)

## 2018-10-09 MED ORDER — LORAZEPAM 1 MG PO TABS: 1.0000 mg | ORAL_TABLET | Freq: Once | ORAL | Status: DC

## 2018-10-09 MED ORDER — LURASIDONE HCL 40 MG PO TABS: 40.0000 mg | ORAL_TABLET | Freq: Every day | ORAL | 0 refills | Status: DC

## 2018-10-09 MED ORDER — LORAZEPAM 2 MG PO TABS
2.0000 mg | ORAL_TABLET | Freq: Once | ORAL | Status: AC
  Administered 2018-10-09: 2 mg via ORAL
  Filled 2018-10-09: qty 1

## 2018-10-09 MED ORDER — LISINOPRIL 10 MG PO TABS
20.0000 mg | ORAL_TABLET | Freq: Once | ORAL | Status: AC
  Administered 2018-10-09: 20 mg via ORAL
  Filled 2018-10-09: qty 2

## 2018-10-09 MED ORDER — ACETAMINOPHEN 500 MG PO TABS
1000.0000 mg | ORAL_TABLET | Freq: Once | ORAL | Status: AC
  Administered 2018-10-09: 1000 mg via ORAL
  Filled 2018-10-09: qty 2

## 2018-10-09 MED ORDER — AMLODIPINE BESYLATE 5 MG PO TABS: 5.0000 mg | ORAL_TABLET | Freq: Once | ORAL | Status: DC
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
## 2018-10-09 NOTE — ED Notes (Signed)
Pt is walking around room and then out to nursing station. Provider made aware.

## 2018-10-09 NOTE — Discharge Instructions (Addendum)
You have been seen in the Emergency Department (ED) today for a psychiatric complaint.  You have been evaluated by psychiatry and we believe you are safe to be discharged from the hospital.  Psychiatry recommends starting Latuda at a dose of 40mg  once a day. Please arrange to follow up with outpatient psychiatry / behavioral medicine to continue monitoring your symptoms.  Please return to the ED immediately if you have ANY thoughts of hurting yourself or anyone else, so that we may help you.  Please avoid alcohol and drug use.  Follow up with your doctor and/or therapist as soon as possible regarding today's ED visit.   Please follow up any other recommendations and clinic appointments provided by the psychiatry team that saw you in the Emergency Department.

## 2018-10-09 NOTE — ED Notes (Signed)
Pt is in bed resting comfortably. We will continue to monitor the pt.

## 2018-10-09 NOTE — ED Triage Notes (Signed)
Patient ambulatory to triage with steady gait, without difficulty or distress noted; pt reports hx anxiety and needs medication to "calm down"; st having insomnia; st off depakote several wks

## 2018-10-09 NOTE — ED Provider Notes (Signed)
-----------------------------------------   10:49 AM on 10/09/2018 -----------------------------------------    Patient has remained calm in the ED.  He was evaluated by psychiatry nurse practitioner who recommends starting Latuda 40 mg once a day.  I followed up with TTS who confirms that psychiatry Dr. Leverne Humbles has reviewed the case and feels this is appropriate and that the patient is stable for discharge without further psychiatry evaluation or treatment here.  I agree that this is reasonable, and will write a prescription as recommended and referred to outpatient resources which TTS is also helping to arrange.  No acute medical issues at this time.  His mild vital sign abnormalities are attributable to his current mental health condition and anxious feelings.  Doubt cardiopulmonary pathology, infection or sepsis, hyperthyroidism.   Carrie Mew, MD 10/09/18 1050

## 2018-10-09 NOTE — ED Notes (Signed)
Pt is being discharged to home. Aox4, VSS, pt does not c/o any anxiety, HI/SI or pain at this time. AVS & RX was given and explained to the pt and he verbalized understanding of all information.  Pt reports that his wife is coming to pick him up.

## 2018-10-09 NOTE — ED Notes (Signed)
Pt stated that he has felling anxious and he has not been able to sleep. Pt stated that he has not taken his Depakote in about 2 weeks due to sexual issues. Pt denies any SI or HI.

## 2018-10-09 NOTE — ED Notes (Signed)
Pt came out to desk stating "I have a splitting headache and I'm going home." this RN stated that the doctor could order some medicine for that. Pt verbalized understanding. Ambulated back to treatment room with charge RN Raquel.
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
## 2018-10-10 ENCOUNTER — Telehealth: Payer: Self-pay

## 2018-10-10 NOTE — Telephone Encounter (Signed)
pt wife called states pt is in the hospital and she wanted to speak with doctor about latuda

## 2018-10-10 NOTE — Telephone Encounter (Signed)
Not my patient

## 2018-10-18 ENCOUNTER — Other Ambulatory Visit: Payer: Self-pay

## 2018-10-18 ENCOUNTER — Encounter: Payer: Self-pay | Admitting: Psychiatry

## 2018-10-18 ENCOUNTER — Ambulatory Visit (INDEPENDENT_AMBULATORY_CARE_PROVIDER_SITE_OTHER): Payer: Medicare HMO | Admitting: Psychiatry

## 2018-10-18 DIAGNOSIS — F17210 Nicotine dependence, cigarettes, uncomplicated: Secondary | ICD-10-CM

## 2018-10-18 DIAGNOSIS — G4700 Insomnia, unspecified: Secondary | ICD-10-CM

## 2018-10-18 DIAGNOSIS — F17200 Nicotine dependence, unspecified, uncomplicated: Secondary | ICD-10-CM

## 2018-10-18 DIAGNOSIS — F1020 Alcohol dependence, uncomplicated: Secondary | ICD-10-CM

## 2018-10-18 DIAGNOSIS — F1220 Cannabis dependence, uncomplicated: Secondary | ICD-10-CM

## 2018-10-18 DIAGNOSIS — F319 Bipolar disorder, unspecified: Secondary | ICD-10-CM | POA: Diagnosis not present

## 2018-10-18 DIAGNOSIS — F09 Unspecified mental disorder due to known physiological condition: Secondary | ICD-10-CM | POA: Diagnosis not present

## 2018-10-18 MED ORDER — QUETIAPINE FUMARATE 100 MG PO TABS: 100.0000 mg | ORAL_TABLET | Freq: Every day | ORAL | 1 refills | Status: DC
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
## 2018-10-18 NOTE — Progress Notes (Signed)
Pt wife called medication and pharmacy was review and updated. Pt medical hx and surgical hx reviewed.  Allergies reviewed with no changes. Vitals were not done due to phone consult

## 2018-10-31 DIAGNOSIS — F39 Unspecified mood [affective] disorder: Secondary | ICD-10-CM | POA: Diagnosis not present

## 2018-10-31 DIAGNOSIS — F319 Bipolar disorder, unspecified: Secondary | ICD-10-CM | POA: Diagnosis not present

## 2018-10-31 DIAGNOSIS — R4189 Other symptoms and signs involving cognitive functions and awareness: Secondary | ICD-10-CM | POA: Diagnosis not present

## 2018-11-07 ENCOUNTER — Ambulatory Visit (INDEPENDENT_AMBULATORY_CARE_PROVIDER_SITE_OTHER): Payer: Medicare HMO | Admitting: Psychiatry

## 2018-11-07 ENCOUNTER — Other Ambulatory Visit: Payer: Self-pay

## 2018-11-07 ENCOUNTER — Encounter: Payer: Self-pay | Admitting: Psychiatry

## 2018-11-07 DIAGNOSIS — F319 Bipolar disorder, unspecified: Secondary | ICD-10-CM

## 2018-11-07 DIAGNOSIS — F09 Unspecified mental disorder due to known physiological condition: Secondary | ICD-10-CM

## 2018-11-07 DIAGNOSIS — F1221 Cannabis dependence, in remission: Secondary | ICD-10-CM | POA: Diagnosis not present

## 2018-11-07 DIAGNOSIS — F17200 Nicotine dependence, unspecified, uncomplicated: Secondary | ICD-10-CM

## 2018-11-07 DIAGNOSIS — F1021 Alcohol dependence, in remission: Secondary | ICD-10-CM

## 2018-11-07 DIAGNOSIS — F5105 Insomnia due to other mental disorder: Secondary | ICD-10-CM | POA: Diagnosis not present

## 2018-11-07 MED ORDER — QUETIAPINE FUMARATE 200 MG PO TABS: 200.0000 mg | ORAL_TABLET | Freq: Every day | ORAL | 0 refills | Status: DC
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
## 2018-11-08 DIAGNOSIS — I10 Essential (primary) hypertension: Secondary | ICD-10-CM | POA: Diagnosis not present

## 2018-11-09 DIAGNOSIS — Z79899 Other long term (current) drug therapy: Secondary | ICD-10-CM | POA: Diagnosis not present

## 2018-11-09 DIAGNOSIS — K210 Gastro-esophageal reflux disease with esophagitis: Secondary | ICD-10-CM | POA: Diagnosis not present

## 2018-11-09 DIAGNOSIS — F319 Bipolar disorder, unspecified: Secondary | ICD-10-CM | POA: Diagnosis not present

## 2018-11-09 DIAGNOSIS — Z87891 Personal history of nicotine dependence: Secondary | ICD-10-CM | POA: Diagnosis not present

## 2018-11-09 DIAGNOSIS — Z9181 History of falling: Secondary | ICD-10-CM | POA: Diagnosis not present

## 2018-11-09 DIAGNOSIS — K5730 Diverticulosis of large intestine without perforation or abscess without bleeding: Secondary | ICD-10-CM | POA: Diagnosis not present

## 2018-11-09 DIAGNOSIS — G3109 Other frontotemporal dementia: Secondary | ICD-10-CM | POA: Diagnosis not present

## 2018-11-09 DIAGNOSIS — F0281 Dementia in other diseases classified elsewhere with behavioral disturbance: Secondary | ICD-10-CM | POA: Diagnosis not present

## 2018-11-09 DIAGNOSIS — I10 Essential (primary) hypertension: Secondary | ICD-10-CM | POA: Diagnosis not present

## 2018-11-11 DIAGNOSIS — R4189 Other symptoms and signs involving cognitive functions and awareness: Secondary | ICD-10-CM | POA: Insufficient documentation

## 2018-11-12 DIAGNOSIS — K210 Gastro-esophageal reflux disease with esophagitis: Secondary | ICD-10-CM | POA: Diagnosis not present

## 2018-11-12 DIAGNOSIS — Z79899 Other long term (current) drug therapy: Secondary | ICD-10-CM | POA: Diagnosis not present

## 2018-11-12 DIAGNOSIS — F0281 Dementia in other diseases classified elsewhere with behavioral disturbance: Secondary | ICD-10-CM | POA: Diagnosis not present

## 2018-11-12 DIAGNOSIS — F319 Bipolar disorder, unspecified: Secondary | ICD-10-CM | POA: Diagnosis not present

## 2018-11-12 DIAGNOSIS — Z9181 History of falling: Secondary | ICD-10-CM | POA: Diagnosis not present

## 2018-11-12 DIAGNOSIS — G3109 Other frontotemporal dementia: Secondary | ICD-10-CM | POA: Diagnosis not present

## 2018-11-12 DIAGNOSIS — Z87891 Personal history of nicotine dependence: Secondary | ICD-10-CM | POA: Diagnosis not present

## 2018-11-12 DIAGNOSIS — K5730 Diverticulosis of large intestine without perforation or abscess without bleeding: Secondary | ICD-10-CM | POA: Diagnosis not present

## 2018-11-12 DIAGNOSIS — I10 Essential (primary) hypertension: Secondary | ICD-10-CM | POA: Diagnosis not present

## 2018-11-14 DIAGNOSIS — Z9181 History of falling: Secondary | ICD-10-CM | POA: Diagnosis not present

## 2018-11-14 DIAGNOSIS — K5730 Diverticulosis of large intestine without perforation or abscess without bleeding: Secondary | ICD-10-CM | POA: Diagnosis not present

## 2018-11-14 DIAGNOSIS — I10 Essential (primary) hypertension: Secondary | ICD-10-CM | POA: Diagnosis not present

## 2018-11-14 DIAGNOSIS — G3109 Other frontotemporal dementia: Secondary | ICD-10-CM | POA: Diagnosis not present

## 2018-11-14 DIAGNOSIS — Z79899 Other long term (current) drug therapy: Secondary | ICD-10-CM | POA: Diagnosis not present

## 2018-11-14 DIAGNOSIS — F0281 Dementia in other diseases classified elsewhere with behavioral disturbance: Secondary | ICD-10-CM | POA: Diagnosis not present

## 2018-11-14 DIAGNOSIS — K210 Gastro-esophageal reflux disease with esophagitis: Secondary | ICD-10-CM | POA: Diagnosis not present

## 2018-11-14 DIAGNOSIS — F319 Bipolar disorder, unspecified: Secondary | ICD-10-CM | POA: Diagnosis not present

## 2018-11-14 DIAGNOSIS — Z87891 Personal history of nicotine dependence: Secondary | ICD-10-CM | POA: Diagnosis not present

## 2018-11-16 ENCOUNTER — Emergency Department: Payer: Medicare HMO

## 2018-11-16 ENCOUNTER — Emergency Department
Admission: EM | Admit: 2018-11-16 | Discharge: 2018-11-16 | Disposition: A | Discharge status: Home / Self Care | Payer: Medicare HMO | Attending: Emergency Medicine | Admitting: Emergency Medicine

## 2018-11-16 ENCOUNTER — Other Ambulatory Visit: Payer: Self-pay

## 2018-11-16 ENCOUNTER — Encounter: Payer: Self-pay | Admitting: Emergency Medicine

## 2018-11-16 DIAGNOSIS — Y939 Activity, unspecified: Secondary | ICD-10-CM | POA: Diagnosis not present

## 2018-11-16 DIAGNOSIS — S61452A Open bite of left hand, initial encounter: Secondary | ICD-10-CM | POA: Diagnosis not present

## 2018-11-16 DIAGNOSIS — J449 Chronic obstructive pulmonary disease, unspecified: Secondary | ICD-10-CM | POA: Diagnosis not present

## 2018-11-16 DIAGNOSIS — Z79899 Other long term (current) drug therapy: Secondary | ICD-10-CM | POA: Insufficient documentation

## 2018-11-16 DIAGNOSIS — I10 Essential (primary) hypertension: Secondary | ICD-10-CM | POA: Insufficient documentation

## 2018-11-16 DIAGNOSIS — W540XXA Bitten by dog, initial encounter: Secondary | ICD-10-CM

## 2018-11-16 DIAGNOSIS — S61052A Open bite of left thumb without damage to nail, initial encounter: Secondary | ICD-10-CM | POA: Diagnosis not present

## 2018-11-16 DIAGNOSIS — Y929 Unspecified place or not applicable: Secondary | ICD-10-CM | POA: Diagnosis not present

## 2018-11-16 DIAGNOSIS — Z87891 Personal history of nicotine dependence: Secondary | ICD-10-CM | POA: Diagnosis not present

## 2018-11-16 DIAGNOSIS — Y999 Unspecified external cause status: Secondary | ICD-10-CM | POA: Diagnosis not present

## 2018-11-16 DIAGNOSIS — S61432A Puncture wound without foreign body of left hand, initial encounter: Secondary | ICD-10-CM | POA: Diagnosis not present

## 2018-11-16 DIAGNOSIS — S6992XA Unspecified injury of left wrist, hand and finger(s), initial encounter: Secondary | ICD-10-CM | POA: Diagnosis present

## 2018-11-16 MED ORDER — AMOXICILLIN-POT CLAVULANATE 875-125 MG PO TABS: 1.0000 | ORAL_TABLET | Freq: Two times a day (BID) | ORAL | 0 refills | Status: AC

## 2018-11-16 NOTE — ED Triage Notes (Signed)
Dog bite L hand 2 days ago. Was patients own dog. States has had rabies shots.

## 2018-11-16 NOTE — Discharge Instructions (Signed)
Make sure incident is reported to [facility] police. Avoid touching dog bite wounds with your bare hands. Take Augmentin twice a day for the next 10 days.  There is a high risk of infection without antibiotic medication.
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
## 2018-11-19 ENCOUNTER — Encounter: Payer: Self-pay | Admitting: Licensed Clinical Social Worker

## 2018-11-19 ENCOUNTER — Ambulatory Visit (INDEPENDENT_AMBULATORY_CARE_PROVIDER_SITE_OTHER): Payer: Medicare HMO | Admitting: Licensed Clinical Social Worker

## 2018-11-19 ENCOUNTER — Other Ambulatory Visit: Payer: Self-pay

## 2018-11-19 DIAGNOSIS — I10 Essential (primary) hypertension: Secondary | ICD-10-CM | POA: Diagnosis not present

## 2018-11-19 DIAGNOSIS — F09 Unspecified mental disorder due to known physiological condition: Secondary | ICD-10-CM

## 2018-11-19 DIAGNOSIS — Z79899 Other long term (current) drug therapy: Secondary | ICD-10-CM | POA: Diagnosis not present

## 2018-11-19 DIAGNOSIS — F319 Bipolar disorder, unspecified: Secondary | ICD-10-CM | POA: Diagnosis not present

## 2018-11-19 DIAGNOSIS — Z9181 History of falling: Secondary | ICD-10-CM | POA: Diagnosis not present

## 2018-11-19 DIAGNOSIS — G3109 Other frontotemporal dementia: Secondary | ICD-10-CM | POA: Diagnosis not present

## 2018-11-19 DIAGNOSIS — Z87891 Personal history of nicotine dependence: Secondary | ICD-10-CM | POA: Diagnosis not present

## 2018-11-19 DIAGNOSIS — K5730 Diverticulosis of large intestine without perforation or abscess without bleeding: Secondary | ICD-10-CM | POA: Diagnosis not present

## 2018-11-19 DIAGNOSIS — F0281 Dementia in other diseases classified elsewhere with behavioral disturbance: Secondary | ICD-10-CM | POA: Diagnosis not present

## 2018-11-19 DIAGNOSIS — K210 Gastro-esophageal reflux disease with esophagitis: Secondary | ICD-10-CM | POA: Diagnosis not present
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
## 2018-11-21 DIAGNOSIS — G3109 Other frontotemporal dementia: Secondary | ICD-10-CM | POA: Diagnosis not present

## 2018-11-21 DIAGNOSIS — F319 Bipolar disorder, unspecified: Secondary | ICD-10-CM | POA: Diagnosis not present

## 2018-11-21 DIAGNOSIS — K210 Gastro-esophageal reflux disease with esophagitis: Secondary | ICD-10-CM | POA: Diagnosis not present

## 2018-11-21 DIAGNOSIS — F0281 Dementia in other diseases classified elsewhere with behavioral disturbance: Secondary | ICD-10-CM | POA: Diagnosis not present

## 2018-11-21 DIAGNOSIS — K5730 Diverticulosis of large intestine without perforation or abscess without bleeding: Secondary | ICD-10-CM | POA: Diagnosis not present

## 2018-11-21 DIAGNOSIS — Z87891 Personal history of nicotine dependence: Secondary | ICD-10-CM | POA: Diagnosis not present

## 2018-11-21 DIAGNOSIS — Z9181 History of falling: Secondary | ICD-10-CM | POA: Diagnosis not present

## 2018-11-21 DIAGNOSIS — Z79899 Other long term (current) drug therapy: Secondary | ICD-10-CM | POA: Diagnosis not present

## 2018-11-21 DIAGNOSIS — I10 Essential (primary) hypertension: Secondary | ICD-10-CM | POA: Diagnosis not present

## 2018-11-26 DIAGNOSIS — F319 Bipolar disorder, unspecified: Secondary | ICD-10-CM | POA: Diagnosis not present

## 2018-11-26 DIAGNOSIS — F0281 Dementia in other diseases classified elsewhere with behavioral disturbance: Secondary | ICD-10-CM | POA: Diagnosis not present

## 2018-11-26 DIAGNOSIS — Z79899 Other long term (current) drug therapy: Secondary | ICD-10-CM | POA: Diagnosis not present

## 2018-11-26 DIAGNOSIS — K210 Gastro-esophageal reflux disease with esophagitis: Secondary | ICD-10-CM | POA: Diagnosis not present

## 2018-11-26 DIAGNOSIS — Z87891 Personal history of nicotine dependence: Secondary | ICD-10-CM | POA: Diagnosis not present

## 2018-11-26 DIAGNOSIS — G3109 Other frontotemporal dementia: Secondary | ICD-10-CM | POA: Diagnosis not present

## 2018-11-26 DIAGNOSIS — K5730 Diverticulosis of large intestine without perforation or abscess without bleeding: Secondary | ICD-10-CM | POA: Diagnosis not present

## 2018-11-26 DIAGNOSIS — Z9181 History of falling: Secondary | ICD-10-CM | POA: Diagnosis not present

## 2018-11-26 DIAGNOSIS — I10 Essential (primary) hypertension: Secondary | ICD-10-CM | POA: Diagnosis not present

## 2018-11-29 DIAGNOSIS — R569 Unspecified convulsions: Secondary | ICD-10-CM | POA: Diagnosis not present

## 2018-12-04 DIAGNOSIS — I10 Essential (primary) hypertension: Secondary | ICD-10-CM | POA: Diagnosis not present

## 2018-12-04 DIAGNOSIS — G3109 Other frontotemporal dementia: Secondary | ICD-10-CM | POA: Diagnosis not present

## 2018-12-04 DIAGNOSIS — F319 Bipolar disorder, unspecified: Secondary | ICD-10-CM | POA: Diagnosis not present

## 2018-12-04 DIAGNOSIS — Z87891 Personal history of nicotine dependence: Secondary | ICD-10-CM | POA: Diagnosis not present

## 2018-12-04 DIAGNOSIS — Z79899 Other long term (current) drug therapy: Secondary | ICD-10-CM | POA: Diagnosis not present

## 2018-12-04 DIAGNOSIS — K5730 Diverticulosis of large intestine without perforation or abscess without bleeding: Secondary | ICD-10-CM | POA: Diagnosis not present

## 2018-12-04 DIAGNOSIS — F0281 Dementia in other diseases classified elsewhere with behavioral disturbance: Secondary | ICD-10-CM | POA: Diagnosis not present

## 2018-12-04 DIAGNOSIS — Z9181 History of falling: Secondary | ICD-10-CM | POA: Diagnosis not present

## 2018-12-04 DIAGNOSIS — K210 Gastro-esophageal reflux disease with esophagitis: Secondary | ICD-10-CM | POA: Diagnosis not present

## 2018-12-10 DIAGNOSIS — R569 Unspecified convulsions: Secondary | ICD-10-CM | POA: Insufficient documentation

## 2018-12-11 ENCOUNTER — Other Ambulatory Visit: Payer: Self-pay

## 2018-12-11 ENCOUNTER — Ambulatory Visit (INDEPENDENT_AMBULATORY_CARE_PROVIDER_SITE_OTHER): Payer: Medicare HMO | Admitting: Psychiatry

## 2018-12-11 ENCOUNTER — Encounter: Payer: Self-pay | Admitting: Psychiatry

## 2018-12-11 DIAGNOSIS — F1221 Cannabis dependence, in remission: Secondary | ICD-10-CM

## 2018-12-11 DIAGNOSIS — F5105 Insomnia due to other mental disorder: Secondary | ICD-10-CM

## 2018-12-11 DIAGNOSIS — F17200 Nicotine dependence, unspecified, uncomplicated: Secondary | ICD-10-CM | POA: Diagnosis not present

## 2018-12-11 DIAGNOSIS — F319 Bipolar disorder, unspecified: Secondary | ICD-10-CM

## 2018-12-11 DIAGNOSIS — F1021 Alcohol dependence, in remission: Secondary | ICD-10-CM | POA: Diagnosis not present

## 2018-12-11 DIAGNOSIS — F09 Unspecified mental disorder due to known physiological condition: Secondary | ICD-10-CM | POA: Diagnosis not present

## 2018-12-11 MED ORDER — ESCITALOPRAM OXALATE 5 MG PO TABS: 5.0000 mg | ORAL_TABLET | Freq: Every day | ORAL | 1 refills | Status: DC
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
## 2018-12-18 ENCOUNTER — Telehealth: Payer: Self-pay

## 2018-12-18 DIAGNOSIS — R451 Restlessness and agitation: Secondary | ICD-10-CM

## 2018-12-18 MED ORDER — QUETIAPINE FUMARATE 50 MG PO TABS: 50.0000 mg | ORAL_TABLET | Freq: Two times a day (BID) | ORAL | 1 refills | Status: DC | PRN

## 2018-12-18 NOTE — Telephone Encounter (Signed)
Please ask her to call 911 or take him to nearest ED if he is agitated and she is unable to manage him or if he is a safety concern.
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
## 2018-12-18 NOTE — Telephone Encounter (Signed)
Spoke to [name] - wife reports he is agitated and unmanageable . Discussed starting seroquel as needed for agitation.

## 2018-12-18 NOTE — Telephone Encounter (Signed)
Tried to call could not leave message it just kept ringing.

## 2018-12-23 DIAGNOSIS — Z87891 Personal history of nicotine dependence: Secondary | ICD-10-CM | POA: Diagnosis not present

## 2018-12-23 DIAGNOSIS — Z96653 Presence of artificial knee joint, bilateral: Secondary | ICD-10-CM | POA: Diagnosis not present

## 2018-12-23 DIAGNOSIS — G3109 Other frontotemporal dementia: Secondary | ICD-10-CM | POA: Diagnosis not present

## 2018-12-23 DIAGNOSIS — F319 Bipolar disorder, unspecified: Secondary | ICD-10-CM | POA: Diagnosis not present

## 2018-12-23 DIAGNOSIS — K210 Gastro-esophageal reflux disease with esophagitis: Secondary | ICD-10-CM | POA: Diagnosis not present

## 2018-12-23 DIAGNOSIS — K5730 Diverticulosis of large intestine without perforation or abscess without bleeding: Secondary | ICD-10-CM | POA: Diagnosis not present

## 2018-12-23 DIAGNOSIS — Z79899 Other long term (current) drug therapy: Secondary | ICD-10-CM | POA: Diagnosis not present

## 2018-12-23 DIAGNOSIS — F0281 Dementia in other diseases classified elsewhere with behavioral disturbance: Secondary | ICD-10-CM | POA: Diagnosis not present

## 2018-12-23 DIAGNOSIS — I10 Essential (primary) hypertension: Secondary | ICD-10-CM | POA: Diagnosis not present

## 2018-12-24 ENCOUNTER — Other Ambulatory Visit: Payer: Self-pay

## 2018-12-24 ENCOUNTER — Ambulatory Visit (INDEPENDENT_AMBULATORY_CARE_PROVIDER_SITE_OTHER): Payer: Medicare HMO | Admitting: Licensed Clinical Social Worker

## 2018-12-24 ENCOUNTER — Encounter: Payer: Self-pay | Admitting: Licensed Clinical Social Worker

## 2018-12-24 DIAGNOSIS — Z87891 Personal history of nicotine dependence: Secondary | ICD-10-CM | POA: Diagnosis not present

## 2018-12-24 DIAGNOSIS — K5730 Diverticulosis of large intestine without perforation or abscess without bleeding: Secondary | ICD-10-CM | POA: Diagnosis not present

## 2018-12-24 DIAGNOSIS — F319 Bipolar disorder, unspecified: Secondary | ICD-10-CM

## 2018-12-24 DIAGNOSIS — F0281 Dementia in other diseases classified elsewhere with behavioral disturbance: Secondary | ICD-10-CM | POA: Diagnosis not present

## 2018-12-24 DIAGNOSIS — Z96653 Presence of artificial knee joint, bilateral: Secondary | ICD-10-CM | POA: Diagnosis not present

## 2018-12-24 DIAGNOSIS — G3109 Other frontotemporal dementia: Secondary | ICD-10-CM | POA: Diagnosis not present

## 2018-12-24 DIAGNOSIS — I10 Essential (primary) hypertension: Secondary | ICD-10-CM | POA: Diagnosis not present

## 2018-12-24 DIAGNOSIS — Z79899 Other long term (current) drug therapy: Secondary | ICD-10-CM | POA: Diagnosis not present

## 2018-12-24 DIAGNOSIS — K210 Gastro-esophageal reflux disease with esophagitis: Secondary | ICD-10-CM | POA: Diagnosis not present

## 2018-12-24 NOTE — Progress Notes (Signed)
Virtual Visit via Telephone Note  I connected with Ian Newman. on 12/24/18 at  2:30 PM EDT by telephone and verified that I am speaking with the correct person using two identifiers.   I discussed the limitations, risks, security and privacy concerns of performing an evaluation and management service by telephone and the availability of in person appointments. I also discussed with the patient that there may be a patient responsible charge related to this service. The patient expressed understanding and agreed to proceed.  I discussed the assessment and treatment plan with the patient. The patient was provided an opportunity to ask questions and all were answered. The patient agreed with the plan and demonstrated an understanding of the instructions.   The patient was advised to call back or seek an in-person evaluation if the symptoms worsen or if the condition fails to improve as anticipated.  I provided 30 minutes of non-face-to-face time during this encounter.   Ian Newman, Ian Newman    THERAPIST PROGRESS NOTE  Session Time: 1400  Participation Level: Minimal  Behavioral Response: NAAlertAnxious  Type of Therapy: Individual Therapy  Treatment Goals addressed: Coping  Interventions: Supportive  Summary: Ian Newman. is a 58 y.o. male who presents with continued symptoms related to his diagnosis. Ian Newman was joined by his wife, Ian Newman, for today's session. Because Ian Newman's speech is limited, his wife provided most information during the session. Ian Newman reports they have been doing much better, "but last week I hit rock bottom. I just couldn't handle everything by myself anymore and I Just felt completely alone. But, I reached out to everyone and got some help." Ian Newman validated and normalized feelings of being overwhelmed, and highlighted how wonderful it was that Ian Newman was able to reach out for help. She reports they now have a physical therapist coming to the home, and Ian Newman children have been  more active in getting Ian Newman out of the house and doing things to give Ian Newman a break from being a caretaker. Ian Newman agreed with Ian Newman's summary of how things have been going after receiving more help. Ian Newman reported the only thing they continue to argue about is Ian Newman believing it is still safe for him to drive, and Ian Newman not allowing it to happen. Ian Newman asked Ian Newman to speak with Ian Newman about that situation. Ian Newman asked Ian Newman how he felt about it, "I know it's not safe, but I could if I wanted to." Ian Newman encouraged Ian Newman to elaborate on why it was not safe--though he was not able to. However, Ian Newman was able to articulate an understanding of not being allowed to drive. Ian Newman laughed and stated, "well, that was easier than I thought it would go!" Ian Newman encouraged Ian Newman and Ian Newman to continue utilizing their support system when things begin to feel overwhelming, and to reach out to Ian Newman if there is anything they need help with inbetween sessions. Both parties expressed understanding and agreement.   Suicidal/Homicidal: No  Therapist Response: Ian Newman continues to work towards his tx goals but has not yet reached them. We will continue to work on emotional regulation skills moving forward.   Plan: Return again in 4 weeks.  Diagnosis: Axis I: Bipolar 1 Disorder    Axis II: No diagnosis    Ian Newman, Ian Newman 12/24/2018

## 2018-12-25 DIAGNOSIS — F319 Bipolar disorder, unspecified: Secondary | ICD-10-CM | POA: Diagnosis not present

## 2018-12-25 DIAGNOSIS — I10 Essential (primary) hypertension: Secondary | ICD-10-CM | POA: Diagnosis not present

## 2018-12-25 DIAGNOSIS — K210 Gastro-esophageal reflux disease with esophagitis: Secondary | ICD-10-CM | POA: Diagnosis not present

## 2018-12-25 DIAGNOSIS — K5730 Diverticulosis of large intestine without perforation or abscess without bleeding: Secondary | ICD-10-CM | POA: Diagnosis not present

## 2018-12-25 DIAGNOSIS — Z87891 Personal history of nicotine dependence: Secondary | ICD-10-CM | POA: Diagnosis not present

## 2018-12-25 DIAGNOSIS — Z96653 Presence of artificial knee joint, bilateral: Secondary | ICD-10-CM | POA: Diagnosis not present

## 2018-12-25 DIAGNOSIS — F0281 Dementia in other diseases classified elsewhere with behavioral disturbance: Secondary | ICD-10-CM | POA: Diagnosis not present

## 2018-12-25 DIAGNOSIS — Z79899 Other long term (current) drug therapy: Secondary | ICD-10-CM | POA: Diagnosis not present

## 2018-12-25 DIAGNOSIS — G3109 Other frontotemporal dementia: Secondary | ICD-10-CM | POA: Diagnosis not present

## 2018-12-30 DIAGNOSIS — Z96653 Presence of artificial knee joint, bilateral: Secondary | ICD-10-CM | POA: Diagnosis not present

## 2018-12-30 DIAGNOSIS — K5730 Diverticulosis of large intestine without perforation or abscess without bleeding: Secondary | ICD-10-CM | POA: Diagnosis not present

## 2018-12-30 DIAGNOSIS — Z87891 Personal history of nicotine dependence: Secondary | ICD-10-CM | POA: Diagnosis not present

## 2018-12-30 DIAGNOSIS — F319 Bipolar disorder, unspecified: Secondary | ICD-10-CM | POA: Diagnosis not present

## 2018-12-30 DIAGNOSIS — G3109 Other frontotemporal dementia: Secondary | ICD-10-CM | POA: Diagnosis not present

## 2018-12-30 DIAGNOSIS — K210 Gastro-esophageal reflux disease with esophagitis: Secondary | ICD-10-CM | POA: Diagnosis not present

## 2018-12-30 DIAGNOSIS — I10 Essential (primary) hypertension: Secondary | ICD-10-CM | POA: Diagnosis not present

## 2018-12-30 DIAGNOSIS — Z79899 Other long term (current) drug therapy: Secondary | ICD-10-CM | POA: Diagnosis not present

## 2018-12-30 DIAGNOSIS — F0281 Dementia in other diseases classified elsewhere with behavioral disturbance: Secondary | ICD-10-CM | POA: Diagnosis not present

## 2018-12-31 DIAGNOSIS — E785 Hyperlipidemia, unspecified: Secondary | ICD-10-CM | POA: Diagnosis not present

## 2018-12-31 DIAGNOSIS — G3109 Other frontotemporal dementia: Secondary | ICD-10-CM | POA: Diagnosis not present

## 2018-12-31 DIAGNOSIS — I10 Essential (primary) hypertension: Secondary | ICD-10-CM | POA: Diagnosis not present

## 2018-12-31 DIAGNOSIS — F0280 Dementia in other diseases classified elsewhere without behavioral disturbance: Secondary | ICD-10-CM | POA: Diagnosis not present

## 2018-12-31 DIAGNOSIS — F319 Bipolar disorder, unspecified: Secondary | ICD-10-CM | POA: Diagnosis not present

## 2019-01-01 DIAGNOSIS — K5730 Diverticulosis of large intestine without perforation or abscess without bleeding: Secondary | ICD-10-CM | POA: Diagnosis not present

## 2019-01-01 DIAGNOSIS — Z96653 Presence of artificial knee joint, bilateral: Secondary | ICD-10-CM | POA: Diagnosis not present

## 2019-01-01 DIAGNOSIS — I10 Essential (primary) hypertension: Secondary | ICD-10-CM | POA: Diagnosis not present

## 2019-01-01 DIAGNOSIS — Z79899 Other long term (current) drug therapy: Secondary | ICD-10-CM | POA: Diagnosis not present

## 2019-01-01 DIAGNOSIS — F0281 Dementia in other diseases classified elsewhere with behavioral disturbance: Secondary | ICD-10-CM | POA: Diagnosis not present

## 2019-01-01 DIAGNOSIS — Z87891 Personal history of nicotine dependence: Secondary | ICD-10-CM | POA: Diagnosis not present

## 2019-01-01 DIAGNOSIS — K210 Gastro-esophageal reflux disease with esophagitis: Secondary | ICD-10-CM | POA: Diagnosis not present

## 2019-01-01 DIAGNOSIS — G3109 Other frontotemporal dementia: Secondary | ICD-10-CM | POA: Diagnosis not present

## 2019-01-01 DIAGNOSIS — F319 Bipolar disorder, unspecified: Secondary | ICD-10-CM | POA: Diagnosis not present

## 2019-01-02 DIAGNOSIS — F0281 Dementia in other diseases classified elsewhere with behavioral disturbance: Secondary | ICD-10-CM | POA: Diagnosis not present

## 2019-01-02 DIAGNOSIS — K210 Gastro-esophageal reflux disease with esophagitis: Secondary | ICD-10-CM | POA: Diagnosis not present

## 2019-01-02 DIAGNOSIS — F319 Bipolar disorder, unspecified: Secondary | ICD-10-CM | POA: Diagnosis not present

## 2019-01-02 DIAGNOSIS — I10 Essential (primary) hypertension: Secondary | ICD-10-CM | POA: Diagnosis not present

## 2019-01-02 DIAGNOSIS — G3109 Other frontotemporal dementia: Secondary | ICD-10-CM | POA: Diagnosis not present

## 2019-01-02 DIAGNOSIS — Z87891 Personal history of nicotine dependence: Secondary | ICD-10-CM | POA: Diagnosis not present

## 2019-01-02 DIAGNOSIS — Z96653 Presence of artificial knee joint, bilateral: Secondary | ICD-10-CM | POA: Diagnosis not present

## 2019-01-02 DIAGNOSIS — K5730 Diverticulosis of large intestine without perforation or abscess without bleeding: Secondary | ICD-10-CM | POA: Diagnosis not present

## 2019-01-02 DIAGNOSIS — Z79899 Other long term (current) drug therapy: Secondary | ICD-10-CM | POA: Diagnosis not present

## 2019-01-03 DIAGNOSIS — K5730 Diverticulosis of large intestine without perforation or abscess without bleeding: Secondary | ICD-10-CM | POA: Diagnosis not present

## 2019-01-03 DIAGNOSIS — Z79899 Other long term (current) drug therapy: Secondary | ICD-10-CM | POA: Diagnosis not present

## 2019-01-03 DIAGNOSIS — K210 Gastro-esophageal reflux disease with esophagitis: Secondary | ICD-10-CM | POA: Diagnosis not present

## 2019-01-03 DIAGNOSIS — G3109 Other frontotemporal dementia: Secondary | ICD-10-CM | POA: Diagnosis not present

## 2019-01-03 DIAGNOSIS — F319 Bipolar disorder, unspecified: Secondary | ICD-10-CM | POA: Diagnosis not present

## 2019-01-03 DIAGNOSIS — I10 Essential (primary) hypertension: Secondary | ICD-10-CM | POA: Diagnosis not present

## 2019-01-03 DIAGNOSIS — Z87891 Personal history of nicotine dependence: Secondary | ICD-10-CM | POA: Diagnosis not present

## 2019-01-03 DIAGNOSIS — Z96653 Presence of artificial knee joint, bilateral: Secondary | ICD-10-CM | POA: Diagnosis not present

## 2019-01-03 DIAGNOSIS — F0281 Dementia in other diseases classified elsewhere with behavioral disturbance: Secondary | ICD-10-CM | POA: Diagnosis not present

## 2019-01-06 DIAGNOSIS — F319 Bipolar disorder, unspecified: Secondary | ICD-10-CM | POA: Diagnosis not present

## 2019-01-06 DIAGNOSIS — Z96653 Presence of artificial knee joint, bilateral: Secondary | ICD-10-CM | POA: Diagnosis not present

## 2019-01-06 DIAGNOSIS — K5730 Diverticulosis of large intestine without perforation or abscess without bleeding: Secondary | ICD-10-CM | POA: Diagnosis not present

## 2019-01-06 DIAGNOSIS — I10 Essential (primary) hypertension: Secondary | ICD-10-CM | POA: Diagnosis not present

## 2019-01-06 DIAGNOSIS — K210 Gastro-esophageal reflux disease with esophagitis: Secondary | ICD-10-CM | POA: Diagnosis not present

## 2019-01-06 DIAGNOSIS — Z79899 Other long term (current) drug therapy: Secondary | ICD-10-CM | POA: Diagnosis not present

## 2019-01-06 DIAGNOSIS — Z87891 Personal history of nicotine dependence: Secondary | ICD-10-CM | POA: Diagnosis not present

## 2019-01-06 DIAGNOSIS — G3109 Other frontotemporal dementia: Secondary | ICD-10-CM | POA: Diagnosis not present

## 2019-01-06 DIAGNOSIS — F0281 Dementia in other diseases classified elsewhere with behavioral disturbance: Secondary | ICD-10-CM | POA: Diagnosis not present

## 2019-01-07 DIAGNOSIS — K5730 Diverticulosis of large intestine without perforation or abscess without bleeding: Secondary | ICD-10-CM | POA: Diagnosis not present

## 2019-01-07 DIAGNOSIS — I10 Essential (primary) hypertension: Secondary | ICD-10-CM | POA: Diagnosis not present

## 2019-01-07 DIAGNOSIS — Z79899 Other long term (current) drug therapy: Secondary | ICD-10-CM | POA: Diagnosis not present

## 2019-01-07 DIAGNOSIS — K210 Gastro-esophageal reflux disease with esophagitis: Secondary | ICD-10-CM | POA: Diagnosis not present

## 2019-01-07 DIAGNOSIS — Z96653 Presence of artificial knee joint, bilateral: Secondary | ICD-10-CM | POA: Diagnosis not present

## 2019-01-07 DIAGNOSIS — F319 Bipolar disorder, unspecified: Secondary | ICD-10-CM | POA: Diagnosis not present

## 2019-01-07 DIAGNOSIS — Z87891 Personal history of nicotine dependence: Secondary | ICD-10-CM | POA: Diagnosis not present

## 2019-01-07 DIAGNOSIS — F0281 Dementia in other diseases classified elsewhere with behavioral disturbance: Secondary | ICD-10-CM | POA: Diagnosis not present

## 2019-01-07 DIAGNOSIS — G3109 Other frontotemporal dementia: Secondary | ICD-10-CM | POA: Diagnosis not present

## 2019-01-08 DIAGNOSIS — Z79899 Other long term (current) drug therapy: Secondary | ICD-10-CM | POA: Diagnosis not present

## 2019-01-08 DIAGNOSIS — G3109 Other frontotemporal dementia: Secondary | ICD-10-CM | POA: Diagnosis not present

## 2019-01-08 DIAGNOSIS — F0281 Dementia in other diseases classified elsewhere with behavioral disturbance: Secondary | ICD-10-CM | POA: Diagnosis not present

## 2019-01-08 DIAGNOSIS — K210 Gastro-esophageal reflux disease with esophagitis: Secondary | ICD-10-CM | POA: Diagnosis not present

## 2019-01-08 DIAGNOSIS — Z87891 Personal history of nicotine dependence: Secondary | ICD-10-CM | POA: Diagnosis not present

## 2019-01-08 DIAGNOSIS — K5730 Diverticulosis of large intestine without perforation or abscess without bleeding: Secondary | ICD-10-CM | POA: Diagnosis not present

## 2019-01-08 DIAGNOSIS — F319 Bipolar disorder, unspecified: Secondary | ICD-10-CM | POA: Diagnosis not present

## 2019-01-08 DIAGNOSIS — I10 Essential (primary) hypertension: Secondary | ICD-10-CM | POA: Diagnosis not present

## 2019-01-08 DIAGNOSIS — Z96653 Presence of artificial knee joint, bilateral: Secondary | ICD-10-CM | POA: Diagnosis not present

## 2019-01-09 DIAGNOSIS — Z96653 Presence of artificial knee joint, bilateral: Secondary | ICD-10-CM | POA: Diagnosis not present

## 2019-01-09 DIAGNOSIS — I10 Essential (primary) hypertension: Secondary | ICD-10-CM | POA: Diagnosis not present

## 2019-01-09 DIAGNOSIS — Z79899 Other long term (current) drug therapy: Secondary | ICD-10-CM | POA: Diagnosis not present

## 2019-01-09 DIAGNOSIS — Z87891 Personal history of nicotine dependence: Secondary | ICD-10-CM | POA: Diagnosis not present

## 2019-01-09 DIAGNOSIS — K5730 Diverticulosis of large intestine without perforation or abscess without bleeding: Secondary | ICD-10-CM | POA: Diagnosis not present

## 2019-01-09 DIAGNOSIS — G3109 Other frontotemporal dementia: Secondary | ICD-10-CM | POA: Diagnosis not present

## 2019-01-09 DIAGNOSIS — K210 Gastro-esophageal reflux disease with esophagitis: Secondary | ICD-10-CM | POA: Diagnosis not present

## 2019-01-09 DIAGNOSIS — F0281 Dementia in other diseases classified elsewhere with behavioral disturbance: Secondary | ICD-10-CM | POA: Diagnosis not present

## 2019-01-09 DIAGNOSIS — F319 Bipolar disorder, unspecified: Secondary | ICD-10-CM | POA: Diagnosis not present

## 2019-01-10 ENCOUNTER — Other Ambulatory Visit: Payer: Self-pay | Admitting: Psychiatry

## 2019-01-10 DIAGNOSIS — F0281 Dementia in other diseases classified elsewhere with behavioral disturbance: Secondary | ICD-10-CM | POA: Diagnosis not present

## 2019-01-10 DIAGNOSIS — F319 Bipolar disorder, unspecified: Secondary | ICD-10-CM | POA: Diagnosis not present

## 2019-01-10 DIAGNOSIS — Z96653 Presence of artificial knee joint, bilateral: Secondary | ICD-10-CM | POA: Diagnosis not present

## 2019-01-10 DIAGNOSIS — I10 Essential (primary) hypertension: Secondary | ICD-10-CM | POA: Diagnosis not present

## 2019-01-10 DIAGNOSIS — G3109 Other frontotemporal dementia: Secondary | ICD-10-CM | POA: Diagnosis not present

## 2019-01-10 DIAGNOSIS — Z87891 Personal history of nicotine dependence: Secondary | ICD-10-CM | POA: Diagnosis not present

## 2019-01-10 DIAGNOSIS — K210 Gastro-esophageal reflux disease with esophagitis: Secondary | ICD-10-CM | POA: Diagnosis not present

## 2019-01-10 DIAGNOSIS — K5730 Diverticulosis of large intestine without perforation or abscess without bleeding: Secondary | ICD-10-CM | POA: Diagnosis not present

## 2019-01-10 DIAGNOSIS — Z79899 Other long term (current) drug therapy: Secondary | ICD-10-CM | POA: Diagnosis not present

## 2019-01-13 DIAGNOSIS — G3109 Other frontotemporal dementia: Secondary | ICD-10-CM | POA: Diagnosis not present

## 2019-01-13 DIAGNOSIS — F0281 Dementia in other diseases classified elsewhere with behavioral disturbance: Secondary | ICD-10-CM | POA: Diagnosis not present

## 2019-01-13 DIAGNOSIS — K210 Gastro-esophageal reflux disease with esophagitis: Secondary | ICD-10-CM | POA: Diagnosis not present

## 2019-01-13 DIAGNOSIS — Z79899 Other long term (current) drug therapy: Secondary | ICD-10-CM | POA: Diagnosis not present

## 2019-01-13 DIAGNOSIS — I10 Essential (primary) hypertension: Secondary | ICD-10-CM | POA: Diagnosis not present

## 2019-01-13 DIAGNOSIS — F319 Bipolar disorder, unspecified: Secondary | ICD-10-CM | POA: Diagnosis not present

## 2019-01-13 DIAGNOSIS — K5730 Diverticulosis of large intestine without perforation or abscess without bleeding: Secondary | ICD-10-CM | POA: Diagnosis not present

## 2019-01-13 DIAGNOSIS — Z96653 Presence of artificial knee joint, bilateral: Secondary | ICD-10-CM | POA: Diagnosis not present

## 2019-01-13 DIAGNOSIS — Z87891 Personal history of nicotine dependence: Secondary | ICD-10-CM | POA: Diagnosis not present

## 2019-01-14 DIAGNOSIS — K210 Gastro-esophageal reflux disease with esophagitis: Secondary | ICD-10-CM | POA: Diagnosis not present

## 2019-01-14 DIAGNOSIS — K5730 Diverticulosis of large intestine without perforation or abscess without bleeding: Secondary | ICD-10-CM | POA: Diagnosis not present

## 2019-01-14 DIAGNOSIS — I10 Essential (primary) hypertension: Secondary | ICD-10-CM | POA: Diagnosis not present

## 2019-01-14 DIAGNOSIS — Z79899 Other long term (current) drug therapy: Secondary | ICD-10-CM | POA: Diagnosis not present

## 2019-01-14 DIAGNOSIS — G3109 Other frontotemporal dementia: Secondary | ICD-10-CM | POA: Diagnosis not present

## 2019-01-14 DIAGNOSIS — Z87891 Personal history of nicotine dependence: Secondary | ICD-10-CM | POA: Diagnosis not present

## 2019-01-14 DIAGNOSIS — Z96653 Presence of artificial knee joint, bilateral: Secondary | ICD-10-CM | POA: Diagnosis not present

## 2019-01-14 DIAGNOSIS — F319 Bipolar disorder, unspecified: Secondary | ICD-10-CM | POA: Diagnosis not present

## 2019-01-14 DIAGNOSIS — F0281 Dementia in other diseases classified elsewhere with behavioral disturbance: Secondary | ICD-10-CM | POA: Diagnosis not present

## 2019-01-16 DIAGNOSIS — M25561 Pain in right knee: Secondary | ICD-10-CM | POA: Diagnosis not present

## 2019-01-16 DIAGNOSIS — Z96651 Presence of right artificial knee joint: Secondary | ICD-10-CM | POA: Diagnosis not present

## 2019-01-17 DIAGNOSIS — G3109 Other frontotemporal dementia: Secondary | ICD-10-CM | POA: Diagnosis not present

## 2019-01-17 DIAGNOSIS — Z96653 Presence of artificial knee joint, bilateral: Secondary | ICD-10-CM | POA: Diagnosis not present

## 2019-01-17 DIAGNOSIS — K5730 Diverticulosis of large intestine without perforation or abscess without bleeding: Secondary | ICD-10-CM | POA: Diagnosis not present

## 2019-01-17 DIAGNOSIS — Z87891 Personal history of nicotine dependence: Secondary | ICD-10-CM | POA: Diagnosis not present

## 2019-01-17 DIAGNOSIS — I10 Essential (primary) hypertension: Secondary | ICD-10-CM | POA: Diagnosis not present

## 2019-01-17 DIAGNOSIS — F319 Bipolar disorder, unspecified: Secondary | ICD-10-CM | POA: Diagnosis not present

## 2019-01-17 DIAGNOSIS — K210 Gastro-esophageal reflux disease with esophagitis: Secondary | ICD-10-CM | POA: Diagnosis not present

## 2019-01-17 DIAGNOSIS — F0281 Dementia in other diseases classified elsewhere with behavioral disturbance: Secondary | ICD-10-CM | POA: Diagnosis not present

## 2019-01-17 DIAGNOSIS — Z79899 Other long term (current) drug therapy: Secondary | ICD-10-CM | POA: Diagnosis not present

## 2019-01-21 ENCOUNTER — Encounter: Payer: Self-pay | Admitting: Psychiatry

## 2019-01-21 ENCOUNTER — Ambulatory Visit (INDEPENDENT_AMBULATORY_CARE_PROVIDER_SITE_OTHER): Payer: Medicare HMO | Admitting: Psychiatry

## 2019-01-21 ENCOUNTER — Other Ambulatory Visit: Payer: Self-pay

## 2019-01-21 DIAGNOSIS — F1021 Alcohol dependence, in remission: Secondary | ICD-10-CM

## 2019-01-21 DIAGNOSIS — F09 Unspecified mental disorder due to known physiological condition: Secondary | ICD-10-CM

## 2019-01-21 DIAGNOSIS — F1221 Cannabis dependence, in remission: Secondary | ICD-10-CM

## 2019-01-21 DIAGNOSIS — F319 Bipolar disorder, unspecified: Secondary | ICD-10-CM | POA: Diagnosis not present

## 2019-01-21 DIAGNOSIS — F5105 Insomnia due to other mental disorder: Secondary | ICD-10-CM

## 2019-01-21 DIAGNOSIS — F17200 Nicotine dependence, unspecified, uncomplicated: Secondary | ICD-10-CM

## 2019-01-21 MED ORDER — BELSOMRA 5 MG PO TABS: 5.0000 mg | ORAL_TABLET | Freq: Every evening | ORAL | 0 refills | Status: DC | PRN
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
## 2019-01-23 DIAGNOSIS — F0281 Dementia in other diseases classified elsewhere with behavioral disturbance: Secondary | ICD-10-CM | POA: Diagnosis not present

## 2019-01-23 DIAGNOSIS — I10 Essential (primary) hypertension: Secondary | ICD-10-CM | POA: Diagnosis not present

## 2019-01-23 DIAGNOSIS — Z79899 Other long term (current) drug therapy: Secondary | ICD-10-CM | POA: Diagnosis not present

## 2019-01-23 DIAGNOSIS — K5730 Diverticulosis of large intestine without perforation or abscess without bleeding: Secondary | ICD-10-CM | POA: Diagnosis not present

## 2019-01-23 DIAGNOSIS — G3109 Other frontotemporal dementia: Secondary | ICD-10-CM | POA: Diagnosis not present

## 2019-01-23 DIAGNOSIS — Z87891 Personal history of nicotine dependence: Secondary | ICD-10-CM | POA: Diagnosis not present

## 2019-01-23 DIAGNOSIS — Z96653 Presence of artificial knee joint, bilateral: Secondary | ICD-10-CM | POA: Diagnosis not present

## 2019-01-23 DIAGNOSIS — F319 Bipolar disorder, unspecified: Secondary | ICD-10-CM | POA: Diagnosis not present

## 2019-01-23 DIAGNOSIS — K210 Gastro-esophageal reflux disease with esophagitis: Secondary | ICD-10-CM | POA: Diagnosis not present

## 2019-02-12 ENCOUNTER — Other Ambulatory Visit: Payer: Self-pay | Admitting: Psychiatry

## 2019-02-12 DIAGNOSIS — F319 Bipolar disorder, unspecified: Secondary | ICD-10-CM

## 2019-02-12 DIAGNOSIS — F5105 Insomnia due to other mental disorder: Secondary | ICD-10-CM

## 2019-02-22 ENCOUNTER — Emergency Department: Payer: Medicare HMO

## 2019-02-22 ENCOUNTER — Encounter: Payer: Self-pay | Admitting: Emergency Medicine

## 2019-02-22 ENCOUNTER — Other Ambulatory Visit: Payer: Self-pay

## 2019-02-22 ENCOUNTER — Inpatient Hospital Stay
Admission: EM | Admit: 2019-02-22 | Discharge: 2019-02-24 | DRG: 684 | Disposition: A | Discharge status: Home / Self Care | Payer: Medicare HMO | Attending: Specialist | Admitting: Specialist

## 2019-02-22 DIAGNOSIS — Z888 Allergy status to other drugs, medicaments and biological substances status: Secondary | ICD-10-CM | POA: Diagnosis not present

## 2019-02-22 DIAGNOSIS — Z8249 Family history of ischemic heart disease and other diseases of the circulatory system: Secondary | ICD-10-CM

## 2019-02-22 DIAGNOSIS — G3109 Other frontotemporal dementia: Secondary | ICD-10-CM | POA: Diagnosis not present

## 2019-02-22 DIAGNOSIS — J449 Chronic obstructive pulmonary disease, unspecified: Secondary | ICD-10-CM | POA: Diagnosis present

## 2019-02-22 DIAGNOSIS — K5790 Diverticulosis of intestine, part unspecified, without perforation or abscess without bleeding: Secondary | ICD-10-CM | POA: Diagnosis present

## 2019-02-22 DIAGNOSIS — E785 Hyperlipidemia, unspecified: Secondary | ICD-10-CM | POA: Diagnosis not present

## 2019-02-22 DIAGNOSIS — F0280 Dementia in other diseases classified elsewhere without behavioral disturbance: Secondary | ICD-10-CM | POA: Diagnosis not present

## 2019-02-22 DIAGNOSIS — N179 Acute kidney failure, unspecified: Secondary | ICD-10-CM | POA: Diagnosis not present

## 2019-02-22 DIAGNOSIS — E860 Dehydration: Secondary | ICD-10-CM | POA: Diagnosis present

## 2019-02-22 DIAGNOSIS — Z813 Family history of other psychoactive substance abuse and dependence: Secondary | ICD-10-CM | POA: Diagnosis not present

## 2019-02-22 DIAGNOSIS — I10 Essential (primary) hypertension: Secondary | ICD-10-CM | POA: Diagnosis not present

## 2019-02-22 DIAGNOSIS — Z803 Family history of malignant neoplasm of breast: Secondary | ICD-10-CM

## 2019-02-22 DIAGNOSIS — Z8349 Family history of other endocrine, nutritional and metabolic diseases: Secondary | ICD-10-CM | POA: Diagnosis not present

## 2019-02-22 DIAGNOSIS — K2100 Gastro-esophageal reflux disease with esophagitis, without bleeding: Secondary | ICD-10-CM | POA: Diagnosis present

## 2019-02-22 DIAGNOSIS — Z818 Family history of other mental and behavioral disorders: Secondary | ICD-10-CM | POA: Diagnosis not present

## 2019-02-22 DIAGNOSIS — K219 Gastro-esophageal reflux disease without esophagitis: Secondary | ICD-10-CM | POA: Diagnosis not present

## 2019-02-22 DIAGNOSIS — I959 Hypotension, unspecified: Secondary | ICD-10-CM | POA: Diagnosis present

## 2019-02-22 DIAGNOSIS — Z20828 Contact with and (suspected) exposure to other viral communicable diseases: Secondary | ICD-10-CM | POA: Diagnosis not present

## 2019-02-22 DIAGNOSIS — Z96653 Presence of artificial knee joint, bilateral: Secondary | ICD-10-CM | POA: Diagnosis present

## 2019-02-22 DIAGNOSIS — Z87891 Personal history of nicotine dependence: Secondary | ICD-10-CM | POA: Diagnosis not present

## 2019-02-22 DIAGNOSIS — F319 Bipolar disorder, unspecified: Secondary | ICD-10-CM | POA: Diagnosis present

## 2019-02-22 DIAGNOSIS — Z810 Family history of intellectual disabilities: Secondary | ICD-10-CM | POA: Diagnosis not present

## 2019-02-22 DIAGNOSIS — I700 Atherosclerosis of aorta: Secondary | ICD-10-CM | POA: Diagnosis not present

## 2019-02-22 DIAGNOSIS — R079 Chest pain, unspecified: Secondary | ICD-10-CM

## 2019-02-22 DIAGNOSIS — N289 Disorder of kidney and ureter, unspecified: Secondary | ICD-10-CM | POA: Diagnosis not present

## 2019-02-22 DIAGNOSIS — Z791 Long term (current) use of non-steroidal anti-inflammatories (NSAID): Secondary | ICD-10-CM

## 2019-02-22 LAB — BASIC METABOLIC PANEL
Anion gap: 9 (ref 5–15)
BUN: 40 mg/dL — ABNORMAL HIGH (ref 6–20)
CO2: 28 mmol/L (ref 22–32)
Calcium: 9 mg/dL (ref 8.9–10.3)
Chloride: 102 mmol/L (ref 98–111)
Creatinine, Ser: 1.65 mg/dL — ABNORMAL HIGH (ref 0.61–1.24)
GFR calc Af Amer: 52 mL/min — ABNORMAL LOW (ref >60)
GFR calc non Af Amer: 45 mL/min — ABNORMAL LOW (ref >60)
Glucose, Bld: 132 mg/dL — ABNORMAL HIGH (ref 70–99)
Potassium: 3.7 mmol/L (ref 3.5–5.1)
Sodium: 139 mmol/L (ref 135–145)

## 2019-02-22 LAB — CBC
HCT: 42 % (ref 39.0–52.0)
Hemoglobin: 14.1 g/dL (ref 13.0–17.0)
MCH: 30.9 pg (ref 26.0–34.0)
MCHC: 33.6 g/dL (ref 30.0–36.0)
MCV: 92.1 fL (ref 80.0–100.0)
Platelets: 250 10*3/uL (ref 150–400)
RBC: 4.56 MIL/uL (ref 4.22–5.81)
RDW: 13.1 % (ref 11.5–15.5)
WBC: 6.6 10*3/uL (ref 4.0–10.5)
nRBC: 0 % (ref 0.0–0.2)

## 2019-02-22 LAB — TROPONIN I (HIGH SENSITIVITY): Troponin I (High Sensitivity): 4 ng/L (ref <18)

## 2019-02-22 MED ORDER — IOHEXOL 350 MG/ML SOLN
100.0000 mL | Freq: Once | INTRAVENOUS | Status: AC | PRN
  Administered 2019-02-22: 100 mL via INTRAVENOUS

## 2019-02-22 MED ORDER — SODIUM CHLORIDE 0.9 % IV BOLUS
1000.0000 mL | Freq: Once | INTRAVENOUS | Status: AC
  Administered 2019-02-22: 1000 mL via INTRAVENOUS
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
## 2019-02-22 NOTE — ED Triage Notes (Signed)
Pt arrives POV and ambulatory to triage with c/o chest pain x half an hour. Pt has mild dementia per spouse and is not the best historian. Pt appears in NAD at this time.
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
## 2019-02-23 LAB — BASIC METABOLIC PANEL
Anion gap: 8 (ref 5–15)
BUN: 31 mg/dL — ABNORMAL HIGH (ref 6–20)
CO2: 28 mmol/L (ref 22–32)
Calcium: 8.2 mg/dL — ABNORMAL LOW (ref 8.9–10.3)
Chloride: 107 mmol/L (ref 98–111)
Creatinine, Ser: 1.27 mg/dL — ABNORMAL HIGH (ref 0.61–1.24)
GFR calc Af Amer: >60 mL/min (ref >60)
GFR calc non Af Amer: >60 mL/min (ref >60)
Glucose, Bld: 94 mg/dL (ref 70–99)
Potassium: 3.8 mmol/L (ref 3.5–5.1)
Sodium: 143 mmol/L (ref 135–145)

## 2019-02-23 LAB — CBC
HCT: 37.3 % — ABNORMAL LOW (ref 39.0–52.0)
Hemoglobin: 12.5 g/dL — ABNORMAL LOW (ref 13.0–17.0)
MCH: 30.8 pg (ref 26.0–34.0)
MCHC: 33.5 g/dL (ref 30.0–36.0)
MCV: 91.9 fL (ref 80.0–100.0)
Platelets: 241 10*3/uL (ref 150–400)
RBC: 4.06 MIL/uL — ABNORMAL LOW (ref 4.22–5.81)
RDW: 13.2 % (ref 11.5–15.5)
WBC: 6.8 10*3/uL (ref 4.0–10.5)
nRBC: 0 % (ref 0.0–0.2)

## 2019-02-23 LAB — TROPONIN I (HIGH SENSITIVITY): Troponin I (High Sensitivity): 4 ng/L (ref <18)

## 2019-02-23 LAB — SARS CORONAVIRUS 2 BY RT PCR (HOSPITAL ORDER, PERFORMED IN ~~LOC~~ HOSPITAL LAB): SARS Coronavirus 2: NEGATIVE

## 2019-02-23 MED ORDER — ONDANSETRON HCL 4 MG PO TABS: 4.0000 mg | ORAL_TABLET | Freq: Four times a day (QID) | ORAL | Status: DC | PRN

## 2019-02-23 MED ORDER — SODIUM CHLORIDE 0.9 % IV SOLN
INTRAVENOUS | Status: AC
  Administered 2019-02-23: 02:00:00 via INTRAVENOUS

## 2019-02-23 MED ORDER — ESCITALOPRAM OXALATE 10 MG PO TABS
5.0000 mg | ORAL_TABLET | Freq: Every evening | ORAL | Status: DC
  Administered 2019-02-23: 5 mg via ORAL
  Filled 2019-02-23 (×2): qty 0.5

## 2019-02-23 MED ORDER — ENOXAPARIN SODIUM 40 MG/0.4ML ~~LOC~~ SOLN
40.0000 mg | SUBCUTANEOUS | Status: DC
  Administered 2019-02-23: 40 mg via SUBCUTANEOUS
  Filled 2019-02-23: qty 0.4

## 2019-02-23 MED ORDER — QUETIAPINE FUMARATE 25 MG PO TABS: 50.0000 mg | ORAL_TABLET | Freq: Two times a day (BID) | ORAL | Status: DC | PRN

## 2019-02-23 MED ORDER — ACETAMINOPHEN 650 MG RE SUPP: 650.0000 mg | Freq: Four times a day (QID) | RECTAL | Status: DC | PRN

## 2019-02-23 MED ORDER — ACETAMINOPHEN 325 MG PO TABS
650.0000 mg | ORAL_TABLET | Freq: Four times a day (QID) | ORAL | Status: DC | PRN
  Administered 2019-02-23: 21:00:00 650 mg via ORAL
  Filled 2019-02-23: qty 2

## 2019-02-23 MED ORDER — ONDANSETRON HCL 4 MG/2ML IJ SOLN: 4.0000 mg | Freq: Four times a day (QID) | INTRAMUSCULAR | Status: DC | PRN

## 2019-02-23 MED ORDER — QUETIAPINE FUMARATE 25 MG PO TABS
200.0000 mg | ORAL_TABLET | Freq: Every day | ORAL | Status: DC
  Administered 2019-02-23 (×2): 200 mg via ORAL
  Filled 2019-02-23 (×2): qty 8

## 2019-02-23 NOTE — Progress Notes (Signed)
[facility] [place] NAME: Ian Newman    MR#:  [id number]  DATE OF BIRTH:  1961-02-08  SUBJECTIVE:   Patient presented to the hospital due to atypical chest pain also noted to be in acute kidney injury.  Troponins have been negative, creatinine improving.  Patient is taking p.o. well.  No other acute events overnight.  REVIEW OF SYSTEMS:    Review of Systems  Constitutional: Negative for chills and fever.  HENT: Negative for congestion and tinnitus.   Eyes: Negative for blurred vision and double vision.  Respiratory: Negative for cough, shortness of breath and wheezing.   Cardiovascular: Negative for chest pain, orthopnea and PND.  Gastrointestinal: Negative for abdominal pain, diarrhea, nausea and vomiting.  Genitourinary: Negative for dysuria and hematuria.  Neurological: Negative for dizziness, sensory change and focal weakness.  All other systems reviewed and are negative.   Nutrition: Heart Healthy Tolerating Diet: Yes Tolerating PT: Await Eval.   DRUG ALLERGIES:   Allergies  Allergen Reactions  . Hydroxyzine Other (See Comments)    "makes me feel weird and strange inside"  . Pepcid [Famotidine] Itching    VITALS:  Blood pressure 138/90, pulse 68, temperature 98 F (36.7 C), temperature source Oral, resp. rate 18, height 6' (1.829 m), weight 108.9 kg, SpO2 98 %.  PHYSICAL EXAMINATION:   Physical Exam  GENERAL:  58 y.o.-year-old patient lying in bed in no acute distress.  EYES: Pupils equal, round, reactive to light and accommodation. No scleral icterus. Extraocular muscles intact.  HEENT: Head atraumatic, normocephalic. Oropharynx and nasopharynx clear.  NECK:  Supple, no jugular venous distention. No thyroid enlargement, no tenderness.  LUNGS: Normal breath sounds bilaterally, no wheezing, rales, rhonchi. No use of accessory muscles of respiration.  CARDIOVASCULAR: S1, S2 normal. No murmurs, rubs, or gallops.   ABDOMEN: Soft, nontender, nondistended. Bowel sounds present. No organomegaly or mass.  EXTREMITIES: No cyanosis, clubbing or edema b/l.    NEUROLOGIC: Cranial nerves II through XII are intact. No focal Motor or sensory deficits b/l.  Globally weak.  PSYCHIATRIC: The patient is alert and oriented x 2.  SKIN: No obvious rash, lesion, or ulcer.    LABORATORY PANEL:   CBC Recent Labs  Lab 02/23/19 0646  WBC 6.8  HGB 12.5*  HCT 37.3*  PLT 241   ------------------------------------------------------------------------------------------------------------------  Chemistries  Recent Labs  Lab 02/23/19 0646  NA 143  K 3.8  CL 107  CO2 28  GLUCOSE 94  BUN 31*  CREATININE 1.27*  CALCIUM 8.2*   ------------------------------------------------------------------------------------------------------------------  Cardiac Enzymes No results for input(s): TROPONINI in the last 168 hours. ------------------------------------------------------------------------------------------------------------------  RADIOLOGY:  Dg Chest Port 1 View  Result Date: 02/22/2019 CLINICAL DATA:  Chest pain. EXAM: PORTABLE CHEST 1 VIEW COMPARISON:  07/19/2018 FINDINGS: Cardiomediastinal silhouette is normal. Mediastinal contours appear intact. There is no evidence of focal airspace consolidation, pleural effusion or pneumothorax. Osseous structures are without acute abnormality. Soft tissues are grossly normal. IMPRESSION: No active disease. Electronically Signed   By: Fidela Salisbury M.D.   On: 02/22/2019 21:58   Ct Angio Chest/abd/pel For Dissection W And/or Wo Contrast  Result Date: 02/22/2019 CLINICAL DATA:  Chest pain EXAM: CT ANGIOGRAPHY CHEST, ABDOMEN AND PELVIS TECHNIQUE: Multidetector CT imaging through the chest, abdomen and pelvis was performed using the standard protocol during bolus administration of intravenous contrast. Multiplanar reconstructed images and MIPs were obtained and reviewed to  evaluate the vascular anatomy. CONTRAST:  130mL OMNIPAQUE IOHEXOL  350 MG/ML SOLN COMPARISON:  05/11/16 FINDINGS: CTA CHEST FINDINGS Cardiovascular: Thoracic aorta is within normal limits. No aneurysmal dilatation or dissection is noted. No cardiac enlargement is seen. No significant coronary calcifications are noted. The pulmonary artery shows a normal branching pattern with no large pulmonary emboli identified. No pericardial effusion is seen. Mediastinum/Nodes: Thoracic inlet is within normal limits. No hilar or mediastinal adenopathy is noted. The esophagus is within normal limits. Lungs/Pleura: Lungs are well aerated bilaterally. No focal infiltrate or sizable effusion is seen. Musculoskeletal: Degenerative changes of the thoracic spine are noted. No acute rib abnormality is noted. Chronic T12 compression deformity is seen. Review of the MIP images confirms the above findings. CTA ABDOMEN AND PELVIS FINDINGS VASCULAR Aorta: Abdominal aorta demonstrates atherosclerotic calcifications without aneurysmal dilatation or focal dissection. No significant stenosis is seen. Celiac: Patent without evidence of aneurysm, dissection, vasculitis or significant stenosis. SMA: Patent without evidence of aneurysm, dissection, vasculitis or significant stenosis. Renals: Both renal arteries are patent without evidence of aneurysm, dissection, vasculitis, fibromuscular dysplasia or significant stenosis. IMA: Patent without evidence of aneurysm, dissection, vasculitis or significant stenosis. Iliacs: Mild atherosclerotic changes are noted. No focal stenosis is seen. Veins: No specific venous abnormality is noted. Review of the MIP images confirms the above findings. NON-VASCULAR Hepatobiliary: No focal liver abnormality is seen. No gallstones, gallbladder wall thickening, or biliary dilatation. Pancreas: Unremarkable. No pancreatic ductal dilatation or surrounding inflammatory changes. Spleen: Normal in size without focal  abnormality. Adrenals/Urinary Tract: Adrenal glands are within normal limits. Kidneys are well visualized bilaterally without renal calculi or obstructive changes. Small cyst with adjacent cortical calcification is noted. Bladder is well distended. Small left posterior diverticulum is seen. Stomach/Bowel: The appendix is not visualized consistent with a prior surgical history. No obstructive or inflammatory changes of the colon are seen. Small bowel is within normal limits. Stomach is unremarkable. Lymphatic: No significant lymphadenopathy is noted. Reproductive: Prostate is unremarkable. Other: No abdominal wall hernia or abnormality. No abdominopelvic ascites. Musculoskeletal: Degenerative changes of lumbar spine are noted. No acute compression deformity is seen. Anterolisthesis of L4 on L5 is noted. Review of the MIP images confirms the above findings. IMPRESSION: Atherosclerotic calcifications of the aorta without aneurysmal dilatation or dissection. No evidence of pulmonary emboli. No acute abnormality is identified correspond with the patient's clinical symptomatology. Chronic changes as described above. Electronically Signed   By: Inez Catalina M.D.   On: 02/22/2019 22:45     ASSESSMENT AND PLAN:   58 year old male with past medical history of frontal temporal dementia, depression, COPD, hyperlipidemia, diverticulosis who presented to the hospital due to atypical chest pain but also noted to be in acute renal failure.  1.  Acute kidney injury-secondary to dehydration and poor p.o. intake. - Creatinine is improving with IV fluid hydration.  Patient is taking p.o. well.  Follow BUN/creatinine urine output.  Renal dose meds, avoid nephrotoxins. -Continue to hold patient's HCTZ and lisinopril.  2.  History of frontotemporal dementia/depression-continue Lexapro, Seroquel.  3.  Essential hypertension-hold antihypertensives given patient's acute kidney injury for now.  Blood pressure stable.  4.   Atypical chest pain-now resolved.  Patient's troponins have been negative.    All the records are reviewed and case discussed with Care Management/Social Worker. Management plans discussed with the patient, family and they are in agreement.  CODE STATUS: Full code  DVT Prophylaxis: Lovenox  TOTAL TIME TAKING CARE OF THIS PATIENT: 30 minutes.   POSSIBLE D/C IN 1-2 DAYS, DEPENDING ON CLINICAL CONDITION.  Henreitta Leber M.D on 02/23/2019 at 2:16 PM  Between 7am to 6pm - Pager - [phone]  After 6pm go to [email or web address] - Proofreader  [facility]  Office  [phone]  CC: Primary care physician; Glendon Axe, MD

## 2019-02-23 NOTE — ED Notes (Signed)
Bed was pulled back at approx 0030, sec to call bed control, charge aware  Family and pt updated
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
## 2019-02-23 NOTE — ED Notes (Signed)
Pt appears with wife, pt reports problems with "pooping", wife reports rapidly and early onset dementia, no problems with bowels but having CP, pt appears pale and ST and hypotensive  Reports hx of wife giving HTN meds d/t machine at home reading "normally high blood pressure"

## 2019-02-24 LAB — BASIC METABOLIC PANEL
Anion gap: 5 (ref 5–15)
BUN: 26 mg/dL — ABNORMAL HIGH (ref 6–20)
CO2: 27 mmol/L (ref 22–32)
Calcium: 7.4 mg/dL — ABNORMAL LOW (ref 8.9–10.3)
Chloride: 96 mmol/L — ABNORMAL LOW (ref 98–111)
Creatinine, Ser: 1.15 mg/dL (ref 0.61–1.24)
GFR calc Af Amer: >60 mL/min (ref >60)
GFR calc non Af Amer: >60 mL/min (ref >60)
Glucose, Bld: 83 mg/dL (ref 70–99)
Potassium: 4.3 mmol/L (ref 3.5–5.1)
Sodium: 128 mmol/L — ABNORMAL LOW (ref 135–145)

## 2019-02-24 LAB — HIV ANTIBODY (ROUTINE TESTING W REFLEX): HIV Screen 4th Generation wRfx: NONREACTIVE

## 2019-02-24 NOTE — Progress Notes (Signed)
Patient alert, cooperative resting in bed. MD anticipates discharge to home today.   Patient voices no c/o discomfort.

## 2019-02-24 NOTE — Progress Notes (Signed)
Patient discharged from unit to [facility] . Wife providing transportation to home. All  Personal belongings with patient. No distress noted.   Discharge meds, follow up instructions reviewed with wife.  Patient demonstrates no s/sx of distress. Alert and ambulatory with SBA.
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
## 2019-02-27 DIAGNOSIS — R4189 Other symptoms and signs involving cognitive functions and awareness: Secondary | ICD-10-CM | POA: Diagnosis not present

## 2019-02-27 DIAGNOSIS — F39 Unspecified mood [affective] disorder: Secondary | ICD-10-CM | POA: Diagnosis not present

## 2019-02-28 ENCOUNTER — Telehealth: Payer: Self-pay | Admitting: Psychiatry

## 2019-02-28 ENCOUNTER — Telehealth: Payer: Self-pay

## 2019-02-28 DIAGNOSIS — F319 Bipolar disorder, unspecified: Secondary | ICD-10-CM

## 2019-02-28 DIAGNOSIS — R451 Restlessness and agitation: Secondary | ICD-10-CM

## 2019-02-28 DIAGNOSIS — F5105 Insomnia due to other mental disorder: Secondary | ICD-10-CM

## 2019-02-28 NOTE — Telephone Encounter (Signed)
Attempted to call patient and wife back - tried both numbers - left message.

## 2019-02-28 NOTE — Telephone Encounter (Signed)
Patient's wife called and stated that the patient's blood pressure cannot get stabilized and it gets very high. She stated that he takes medication that she gives him when she gets home from work and stated that his daughter is a Marine scientist and is working on the blood pressure issue. His wife also stated that he gets panic attacks and she would like something prescribed to help him calm down. She stated that when it happens, he keeps wanting to go to emergency and she can't keep doing that. Please review and advise. Thank you.

## 2019-03-03 ENCOUNTER — Other Ambulatory Visit: Payer: Self-pay | Admitting: *Deleted
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
## 2019-03-03 NOTE — Patient Outreach (Addendum)
Ian Newman) Care Management  03/03/2019  Ian Newman. [birth year] [id number]   EMMI-general discharge from [place] Day # 4 Date: Saturday 03/01/19 10 am  Red Alert Reason: Lost interest in things? Yes  Sad/hopeless/anxious/empty? Yes Other questions/problems? Yes  Insurance: [employer] medicare  [facility] admissions x 2 ED visits x 3 in the last 6 months  Last admission on 02/22/19 d/c on 02/24/19 dx renal insufficiency, chest pain, hypotension,   Outreach attempt # 1 to the number listed on the EMMI encounter as [phone] His wife, [name] answered she reports this is "my number" She reports there is a home number. Explained to her that [phone] is the number listed on the EMMI encounter to reach pt  She informs [facility] RN CM that Ian Newman is asleep also related to his use of Seroquel and she is not at home at this time She explains that Ian Newman has memory issues related to his dementia and generally does not use the telephone She reports she is his POA and has recently completed forms with notarizing but has not distributed any copies to medical providers at this time. [facility] RN CM unable to find DPR nor POA in Epic. [facility] RN CM inquired who may have answered the EMMI questions and was informed she had not and "He must have rolled over and answered the telephone" Ian Newman as of 03/03/19 is noted to have completed 2 EMMI calss on 02/26/19 and 03/01/19 [name] and [facility] RN CM agreed after CM discussed HIPAA that RN CM would attempt to call back after 3 pm when pt was awake and [name] was at home generally.    Conditions: renal insufficiency, chest pain, hypotension, GERD HLD, HTN (HCTZ, lisinopril), frontal lobe dementia (Lexapro, Seroquel), AKI, bipolar 1 disorder, chicken pox, colon polyps, colon tumor, COPD, diverticulosis, duodenal ulcer, environmental and seasonal allergies, Hemorrhoid, depression, hx of noncompliance, s/p right TKR, alcohol use disorder, cannabis use disorder,  insomnia/slep disturbance   Plan: [facility] RN CM sent an unsuccessful outreach letter and scheduled this patient for another call attempt within 4 business days   Kimberly L. Lavina Hamman, RN, BSN, [place] Coordinator Office number [phone] Mobile number [phone]  Main [facility] number [phone] Fax number [phone]

## 2019-03-03 NOTE — Patient Outreach (Signed)
[facility]) Care Management  03/03/2019  Mancil Pfenning. [birth year] [id number]   EMMI-general discharge from [facility] Day # 4 Date: Saturday 03/01/19 10 am  Red Alert Reason: Lost interest in things? Yes  Sad/hopeless/anxious/empty? Yes Other questions/problems? Yes  Insurance: [employer] medicare  [facility] admissions x 2 ED visits x 3 in the last 6 months  Last admission on 02/22/19 d/c on 02/24/19 dx renal insufficiency, chest pain, hypotension,   Outreach attempt # 1 B to the number listed on the EMMI encounter as [phone] No answer The voice mail box has not been set up therefore [place] CM was not able to leave a message  Outreach attempt # 1 C at the home number [phone] No answer. [facility] RN CM left HIPAA compliant voicemail message along with CM's contact info.    Conditions: renal insufficiency, chest pain, hypotension, GERD HLD, HTN (HCTZ, lisinopril), frontal lobe dementia (Lexapro, Seroquel), AKI, bipolar 1 disorder, chicken pox, colon polyps, colon tumor, COPD, diverticulosis, duodenal ulcer, environmental and seasonal allergies, Hemorrhoid, depression, hx of noncompliance, s/p right TKR, alcohol use disorder, cannabis use disorder, insomnia/slep disturbance   Plan: [facility] RN CM sent an unsuccessful outreach letter and scheduled this patient for another call attempt within 4 business days   Aymara Sassi L. Lavina Hamman, RN, BSN, [place] Coordinator Office number [phone] Mobile number [phone]  Main [facility] number [phone] Fax number [phone]

## 2019-03-04 ENCOUNTER — Other Ambulatory Visit: Payer: Self-pay | Admitting: *Deleted

## 2019-03-04 DIAGNOSIS — I10 Essential (primary) hypertension: Secondary | ICD-10-CM | POA: Diagnosis not present

## 2019-03-04 DIAGNOSIS — R451 Restlessness and agitation: Secondary | ICD-10-CM | POA: Insufficient documentation

## 2019-03-04 DIAGNOSIS — F319 Bipolar disorder, unspecified: Secondary | ICD-10-CM | POA: Diagnosis not present

## 2019-03-04 DIAGNOSIS — G3109 Other frontotemporal dementia: Secondary | ICD-10-CM | POA: Diagnosis not present

## 2019-03-04 DIAGNOSIS — E785 Hyperlipidemia, unspecified: Secondary | ICD-10-CM | POA: Diagnosis not present

## 2019-03-04 DIAGNOSIS — R569 Unspecified convulsions: Secondary | ICD-10-CM | POA: Diagnosis not present

## 2019-03-04 DIAGNOSIS — F0280 Dementia in other diseases classified elsewhere without behavioral disturbance: Secondary | ICD-10-CM | POA: Diagnosis not present

## 2019-03-04 MED ORDER — QUETIAPINE FUMARATE 25 MG PO TABS: 25.0000 mg | ORAL_TABLET | Freq: Two times a day (BID) | ORAL | 2 refills | Status: DC | PRN

## 2019-03-04 NOTE — Telephone Encounter (Signed)
pt wife called and states that the medication is not working at all states that he doing odd things is out of control she needs to have his medication adjusted or something done

## 2019-03-04 NOTE — Telephone Encounter (Signed)
called wanted you to look at her office not from yesterday listed unter patient outreach   gibbs,k.  she has more information

## 2019-03-04 NOTE — Patient Outreach (Signed)
[facility]) Care Management  03/04/2019  Eaven Schwager 1961/06/02 [id number]    Care coordination for EMMI referred patient - Call to Psychiatry  On 03/03/19 contact is was shared Main concern is getting a return call from Dr Shea Evans, psychiatry office after his wife has left several messages without a response related to his treatment plan (wanting to be offered a medicine "to calm him down")  [facility] RN CM called to Dr Shea Evans office and spoke with Janett Billow, discussed the voiced on concern on 03/03/19. Janett Billow will send an urgent message to Dr Shea Evans to return a call to patient's wife The wife's mobile number was verified     [facility] RN CM will follow up after coordination with psychiatry office and scheduled this patient for another call attempt within 4 business days for f/u   Sussie Minor L. Lavina Hamman, RN, BSN, [place] Coordinator Office number [phone] Mobile number [phone]  Main [facility] number [phone] Fax number [phone]

## 2019-03-04 NOTE — Patient Outreach (Signed)
[facility]) Care Management  03/04/2019  Ian Newman. 1960-08-22 [id number]   Care coordination  Outreach attempt to [phone] No answer. [facility] RN CM lwas not able to leave a message as the voice mailbox was full  Plan [facility] RN CMscheduled this patient for another call attempt within 4 business daysfor f/u   [place]. Lavina Hamman, RN, BSN, [place] Coordinator Office number [phone] Mobile number [phone]  Main [facility] number [phone] Fax number [phone]

## 2019-03-04 NOTE — Telephone Encounter (Signed)
Returned call to wife Ms. Donald Siva.  Reviewed message received from Ms. Beckey Downing patient's wife is having difficulty reaching out to [place] and not receiving any phone calls back.  She is having some trouble with her husband having agitation and needing medication readjustment.  Spoke to wife-Ms. Donald Siva- discussed with her that writer had called both her phone numbers on August 14 after receiving a call from her.  This was Friday.  She reports that she was out in the country and her phone does not pick up any calls when she is there.  She reported that she hence went and got a new sim card.  She reports that Peterson Ao - her husband is having agitation and anxiety towards the afternoon.  He had some dehydration recently and was managed for that.  He is currently doing well with that.  She agrees that his dementia is getting worse and it is possible that his behavioral problems can also get worse.  She reports she canceled her appointment with writer on Monday( yesterday) , because she had to go on her school bus trip and could not keep it.  Discussed with wife to make sure he follows up with his appointments on a regular basis if he is having trouble with his mood to make medication readjustment as needed.  Discussed starting Seroquel 25 mg twice a day as needed for agitation.  She could also use the 25 mg in the afternoon scheduled if he is having a lot of problems.  She agrees with plan.

## 2019-03-05 ENCOUNTER — Other Ambulatory Visit: Payer: Self-pay | Admitting: *Deleted

## 2019-03-05 ENCOUNTER — Other Ambulatory Visit: Payer: Self-pay

## 2019-03-05 NOTE — Patient Outreach (Addendum)
Ian Newman) Care Management  03/05/2019  Ian Newman. Jul 29, 1960 [id number]   Original referral for  EMMI-general dischargefrom [facility] RED ON EMMI ALERT Day #4 Date:Saturday 03/01/19 10 am Red Alert Reason:Lost interest in things?Yes Sad/hopeless/anxious/empty? Yes Other questions/problems? Yes  Insurance:[employer] medicare  [facility] admissions x2ED visits x 3in the last 6 months  Last admission on 02/22/19 d/c on 02/24/19 dx renal insufficiency, chest pain, hypotension,  Follow up call (psychiatrist)   Patient's wife is able to verify HIPAA DOB and address  On 03/03/19 Mr Ian Newman gave all [facility] Staff the permission to speak with his wife,Ian Newman,  at any time on his behalf   Reviewed collaboration with Janett Billow at Dr Shea Evans office on 03/04/19 with Ian Newman  His wife Ian Newman confirms she did receive a return call fromDr Eappen called yesterday 03/03/19 and Mr Roback was prescribed Seroquel 25 mg. The Rx has been filled and pt is taking it without issues. Wife states it is helping    Social  Mr Ian Newman is a disabled 58 year old male living at home with his wife, Ian Newman. He is assist with care and transportation needs   Conditions:renal insufficiency, chest pain, hypotension, GERD HLD, HTN (HCTZ, lisinopril), frontal lobe dementia (Lexapro, Seroquel), AKI, bipolar 1 disorder, chicken pox, colon polyps, colon tumor, COPD, diverticulosis, duodenal ulcer, environmental and seasonal allergies, Hemorrhoid, depression, hx of noncompliance, s/p right TKR, alcohol use disorder, cannabis use disorder, insomnia/sleep disturbance  Consent: [facility] RN CM reviewed [facility] services with patient. Patient gave verbal consent for services [facility] telephonic RN CM   Plan: [facility] RN CM will close case at this time as patient has been assessed and no needs identified/needs resolved.   Pt encouraged to return a call to [place] CM prn  [facility] RN CM discussed the outreach letter already sent on 03/03/19  with [facility] brochure enclosed for review  Route to MDs   Joelene Millin L. Lavina Hamman, RN, BSN, [place] Coordinator Office number [phone] Mobile number [phone]  Main [facility] number [phone] Fax number [phone]

## 2019-03-05 NOTE — Progress Notes (Signed)
Attempted to call patient last week after receiving call from wife  ,on several phone numbers in chart , but did not pick up. Per discussion with wife yesterday , she reported that since she was out in the country could not pick up the phone. She also cancelled patient appointment for august 17 th. Discussed regular follow ups for continued medication management. Thanks for reaching out.

## 2019-03-10 ENCOUNTER — Other Ambulatory Visit: Payer: Self-pay | Admitting: Psychiatry

## 2019-03-10 DIAGNOSIS — F319 Bipolar disorder, unspecified: Secondary | ICD-10-CM

## 2019-03-12 ENCOUNTER — Telehealth: Payer: Self-pay

## 2019-03-12 DIAGNOSIS — F09 Unspecified mental disorder due to known physiological condition: Secondary | ICD-10-CM

## 2019-03-12 DIAGNOSIS — F319 Bipolar disorder, unspecified: Secondary | ICD-10-CM

## 2019-03-12 DIAGNOSIS — F5105 Insomnia due to other mental disorder: Secondary | ICD-10-CM

## 2019-03-12 MED ORDER — OLANZAPINE 5 MG PO TABS: 2.5000 mg | ORAL_TABLET | Freq: Every day | ORAL | 1 refills | Status: DC | PRN

## 2019-03-12 MED ORDER — OLANZAPINE 10 MG PO TABS: 5.0000 mg | ORAL_TABLET | Freq: Every day | ORAL | 1 refills | Status: DC

## 2019-03-12 NOTE — Telephone Encounter (Signed)
Spoke to [name] - wife - patient is still agitated - no response to seroquel. Will start Zyprexa. Sent it to pharmacy.

## 2019-03-12 NOTE — Telephone Encounter (Signed)
pt wife called states that the seroquel is not working for his sun downing. states that yesterday was a bad day and that he is having alot of anxiety which is causing his bp to be too high.

## 2019-03-20 ENCOUNTER — Other Ambulatory Visit: Payer: Self-pay

## 2019-03-20 ENCOUNTER — Encounter: Payer: Self-pay | Admitting: Psychiatry

## 2019-03-20 ENCOUNTER — Ambulatory Visit (INDEPENDENT_AMBULATORY_CARE_PROVIDER_SITE_OTHER): Payer: Medicare HMO | Admitting: Psychiatry

## 2019-03-20 DIAGNOSIS — F1221 Cannabis dependence, in remission: Secondary | ICD-10-CM

## 2019-03-20 DIAGNOSIS — F0281 Dementia in other diseases classified elsewhere with behavioral disturbance: Secondary | ICD-10-CM

## 2019-03-20 DIAGNOSIS — F17200 Nicotine dependence, unspecified, uncomplicated: Secondary | ICD-10-CM | POA: Diagnosis not present

## 2019-03-20 DIAGNOSIS — F1021 Alcohol dependence, in remission: Secondary | ICD-10-CM

## 2019-03-20 DIAGNOSIS — F319 Bipolar disorder, unspecified: Secondary | ICD-10-CM

## 2019-03-20 DIAGNOSIS — F02B18 Dementia in other diseases classified elsewhere, moderate, with other behavioral disturbance: Secondary | ICD-10-CM
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
## 2019-04-07 ENCOUNTER — Telehealth: Payer: Self-pay

## 2019-04-07 DIAGNOSIS — F319 Bipolar disorder, unspecified: Secondary | ICD-10-CM

## 2019-04-07 MED ORDER — OLANZAPINE 15 MG PO TABS: 15.0000 mg | ORAL_TABLET | Freq: Every day | ORAL | 1 refills | Status: DC

## 2019-04-07 NOTE — Telephone Encounter (Signed)
spoke with pt wife she states he is doing better.  she states that friday he was bad but over the weekend he was alot better.

## 2019-04-07 NOTE — Telephone Encounter (Signed)
Jess please ask her to take him to nearest ED or call 911 if he is that manic and agitated.

## 2019-04-07 NOTE — Telephone Encounter (Signed)
Spoke to Lambert, who reports patient is having some episodes of agitation.  He appeared to be manic on Friday when he was walking out in the rain.  He finally crashed and has been calm since the past 2 days.  He continues to have some trouble at night with his sleep.  However Zyprexa does help.  Discussed readjusting Zyprexa to 15.  Also discussed that his labs including his sodium level has to be rechecked.  She will take him to primary care.  Discussed with wife that if he is agitated or confused it could also mean he has underlying problems since he struggles with multiple health issues.  Make sure she takes him to nearest emergency department if he is acutely aggressive or agitated and does not respond to medications or is confused.  She agrees with plan.

## 2019-04-07 NOTE — Telephone Encounter (Signed)
pt wife left a message friday afternoon\ states that he is acting manic,  he is walking in the rain and keeps changing shirts and she cant get him to stay in.

## 2019-04-08 ENCOUNTER — Telehealth: Payer: Self-pay
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
## 2019-04-08 NOTE — Telephone Encounter (Signed)
tc 12:05 left message to go to ed.

## 2019-04-08 NOTE — Telephone Encounter (Signed)
Jess please ask her to take him to nearest ED if he is not responding to meds we discussed yesterday. I had increased medication yesterday. So please ask to go to ED.

## 2019-04-22 ENCOUNTER — Other Ambulatory Visit: Payer: Self-pay

## 2019-04-22 ENCOUNTER — Ambulatory Visit (INDEPENDENT_AMBULATORY_CARE_PROVIDER_SITE_OTHER): Payer: Medicare HMO | Admitting: Psychiatry

## 2019-04-22 ENCOUNTER — Encounter: Payer: Self-pay | Admitting: Psychiatry

## 2019-04-22 DIAGNOSIS — F1221 Cannabis dependence, in remission: Secondary | ICD-10-CM

## 2019-04-22 DIAGNOSIS — F1021 Alcohol dependence, in remission: Secondary | ICD-10-CM | POA: Diagnosis not present

## 2019-04-22 DIAGNOSIS — F17200 Nicotine dependence, unspecified, uncomplicated: Secondary | ICD-10-CM | POA: Diagnosis not present

## 2019-04-22 DIAGNOSIS — F319 Bipolar disorder, unspecified: Secondary | ICD-10-CM | POA: Diagnosis not present

## 2019-04-22 DIAGNOSIS — F0281 Dementia in other diseases classified elsewhere with behavioral disturbance: Secondary | ICD-10-CM | POA: Diagnosis not present

## 2019-04-22 DIAGNOSIS — F02B18 Dementia in other diseases classified elsewhere, moderate, with other behavioral disturbance: Secondary | ICD-10-CM | POA: Insufficient documentation

## 2019-04-22 MED ORDER — OLANZAPINE 15 MG PO TABS: 15.0000 mg | ORAL_TABLET | Freq: Every day | ORAL | 1 refills | Status: DC

## 2019-04-22 MED ORDER — OLANZAPINE 5 MG PO TABS: 2.5000 mg | ORAL_TABLET | Freq: Every day | ORAL | 1 refills | Status: DC | PRN
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
## 2019-04-25 ENCOUNTER — Telehealth: Payer: Self-pay

## 2019-04-25 DIAGNOSIS — R451 Restlessness and agitation: Secondary | ICD-10-CM

## 2019-04-25 MED ORDER — TRAZODONE HCL 50 MG PO TABS: 50.0000 mg | ORAL_TABLET | Freq: Two times a day (BID) | ORAL | 1 refills | Status: DC | PRN

## 2019-04-25 NOTE — Telephone Encounter (Signed)
Returned call to patient.  Wife-Jo picked up the phone. She reports he is having episodes when he is agitated and walking around, which is not manageable at home.  He is also having these episodes when he sweats a lot.  He is not drinking enough water.  He smokes a lot of cigarettes.  Discussed with wife that based on our evaluation recently during the session he was pleasant, calm and was able to answer questions appropriately.  At that visit he also said he was sleeping good at night.  Discussed with her that it is likely the Zyprexa is helpful with the sleep so will not change the bedtime dosage today.  However can start small dose of trazodone 50 mg twice a day as needed for anxiety agitation during the day.  However advised her to take him to the nearest emergency department to get her blood work-up and urine analysis to make sure he is doing okay and not delirious and there is no other underlying problems going on.  Also discussed with her that if he is a safety risk like going into other people's home he is placing himself and others in danger and if that happens she has to call 911 and take him to the nearest emergency department.  He may also need 24-hour supervision.  Discussed with her resources like day programs, home health care or assisted living facilities.  She reports she had a discussion with her neurology nurse yesterday and she has a meeting set up.

## 2019-04-25 NOTE — Telephone Encounter (Signed)
pt wife called states that she didnt think the zyprexa is working and that his anxiety is worse.  pt still walking into people houses. and he is sweating alot. wants to know if he can have something for his anxiety.

## 2019-04-26 ENCOUNTER — Other Ambulatory Visit: Payer: Self-pay

## 2019-04-26 ENCOUNTER — Emergency Department: Payer: Medicare HMO

## 2019-04-26 ENCOUNTER — Emergency Department
Admission: EM | Admit: 2019-04-26 | Discharge: 2019-04-26 | Disposition: A | Discharge status: Home / Self Care | Payer: Medicare HMO | Attending: Emergency Medicine | Admitting: Emergency Medicine

## 2019-04-26 ENCOUNTER — Encounter: Payer: Self-pay | Admitting: Emergency Medicine

## 2019-04-26 DIAGNOSIS — Z79899 Other long term (current) drug therapy: Secondary | ICD-10-CM | POA: Insufficient documentation

## 2019-04-26 DIAGNOSIS — I10 Essential (primary) hypertension: Secondary | ICD-10-CM | POA: Insufficient documentation

## 2019-04-26 DIAGNOSIS — Z87891 Personal history of nicotine dependence: Secondary | ICD-10-CM | POA: Insufficient documentation

## 2019-04-26 DIAGNOSIS — R079 Chest pain, unspecified: Secondary | ICD-10-CM | POA: Diagnosis not present

## 2019-04-26 DIAGNOSIS — F1210 Cannabis abuse, uncomplicated: Secondary | ICD-10-CM | POA: Diagnosis not present

## 2019-04-26 DIAGNOSIS — E860 Dehydration: Secondary | ICD-10-CM | POA: Insufficient documentation

## 2019-04-26 DIAGNOSIS — R002 Palpitations: Secondary | ICD-10-CM | POA: Diagnosis not present

## 2019-04-26 DIAGNOSIS — J449 Chronic obstructive pulmonary disease, unspecified: Secondary | ICD-10-CM | POA: Diagnosis not present

## 2019-04-26 LAB — COMPREHENSIVE METABOLIC PANEL
ALT: 27 U/L (ref 0–44)
AST: 39 U/L (ref 15–41)
Albumin: 4.4 g/dL (ref 3.5–5.0)
Alkaline Phosphatase: 64 U/L (ref 38–126)
Anion gap: 13 (ref 5–15)
BUN: 30 mg/dL — ABNORMAL HIGH (ref 6–20)
CO2: 24 mmol/L (ref 22–32)
Calcium: 9.3 mg/dL (ref 8.9–10.3)
Chloride: 102 mmol/L (ref 98–111)
Creatinine, Ser: 1.36 mg/dL — ABNORMAL HIGH (ref 0.61–1.24)
GFR calc Af Amer: >60 mL/min (ref >60)
GFR calc non Af Amer: 57 mL/min — ABNORMAL LOW (ref >60)
Glucose, Bld: 119 mg/dL — ABNORMAL HIGH (ref 70–99)
Potassium: 3.9 mmol/L (ref 3.5–5.1)
Sodium: 139 mmol/L (ref 135–145)
Total Bilirubin: 1.2 mg/dL (ref 0.3–1.2)
Total Protein: 7.1 g/dL (ref 6.5–8.1)

## 2019-04-26 LAB — CBC WITH DIFFERENTIAL/PLATELET
Abs Immature Granulocytes: 0.02 10*3/uL (ref 0.00–0.07)
Basophils Absolute: 0 10*3/uL (ref 0.0–0.1)
Basophils Relative: 0 %
Eosinophils Absolute: 0.1 10*3/uL (ref 0.0–0.5)
Eosinophils Relative: 1 %
HCT: 44 % (ref 39.0–52.0)
Hemoglobin: 14.4 g/dL (ref 13.0–17.0)
Immature Granulocytes: 0 %
Lymphocytes Relative: 13 %
Lymphs Abs: 1.2 10*3/uL (ref 0.7–4.0)
MCH: 31 pg (ref 26.0–34.0)
MCHC: 32.7 g/dL (ref 30.0–36.0)
MCV: 94.8 fL (ref 80.0–100.0)
Monocytes Absolute: 0.6 10*3/uL (ref 0.1–1.0)
Monocytes Relative: 6 %
Neutro Abs: 7.4 10*3/uL (ref 1.7–7.7)
Neutrophils Relative %: 80 %
Platelets: 302 10*3/uL (ref 150–400)
RBC: 4.64 MIL/uL (ref 4.22–5.81)
RDW: 13 % (ref 11.5–15.5)
WBC: 9.3 10*3/uL (ref 4.0–10.5)
nRBC: 0 % (ref 0.0–0.2)

## 2019-04-26 LAB — TROPONIN I (HIGH SENSITIVITY): Troponin I (High Sensitivity): 5 ng/L (ref <18)

## 2019-04-26 LAB — LIPASE, BLOOD: Lipase: 30 U/L (ref 11–51)

## 2019-04-26 MED ORDER — SODIUM CHLORIDE 0.9% FLUSH
3.0000 mL | Freq: Once | INTRAVENOUS | Status: AC
  Administered 2019-04-26: 3 mL via INTRAVENOUS

## 2019-04-26 MED ORDER — SODIUM CHLORIDE 0.9 % IV SOLN
1000.0000 mL | Freq: Once | INTRAVENOUS | Status: AC
  Administered 2019-04-26: 1000 mL via INTRAVENOUS

## 2019-04-26 NOTE — ED Triage Notes (Signed)
Per wife patient states he feels "weird". States same thing to me. States feels palpitations but not pain. Patient history of dementia with trouble giving history.
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
## 2019-04-26 NOTE — ED Notes (Signed)
Pt states feeling much better after the fluids. Wife worried that he only drink green tea, advised as long as caffeine free that it was okay (water always better tho). She also advises that he "smokes like a chimney since the covid". Advised that may be why he feels bad at times as it can cause acid reflux, jitteriness, etc.

## 2019-05-01 ENCOUNTER — Inpatient Hospital Stay
Admission: EM | Admit: 2019-05-01 | Discharge: 2019-05-05 | DRG: 683 | Disposition: A | Discharge status: Home Health | Payer: Medicare HMO | Attending: Internal Medicine | Admitting: Internal Medicine

## 2019-05-01 ENCOUNTER — Encounter: Payer: Self-pay | Admitting: *Deleted

## 2019-05-01 ENCOUNTER — Other Ambulatory Visit: Payer: Self-pay

## 2019-05-01 DIAGNOSIS — J449 Chronic obstructive pulmonary disease, unspecified: Secondary | ICD-10-CM | POA: Diagnosis present

## 2019-05-01 DIAGNOSIS — Z8711 Personal history of peptic ulcer disease: Secondary | ICD-10-CM | POA: Diagnosis not present

## 2019-05-01 DIAGNOSIS — Z818 Family history of other mental and behavioral disorders: Secondary | ICD-10-CM | POA: Diagnosis not present

## 2019-05-01 DIAGNOSIS — R0600 Dyspnea, unspecified: Secondary | ICD-10-CM | POA: Diagnosis not present

## 2019-05-01 DIAGNOSIS — G3109 Other frontotemporal dementia: Secondary | ICD-10-CM | POA: Diagnosis not present

## 2019-05-01 DIAGNOSIS — Z87891 Personal history of nicotine dependence: Secondary | ICD-10-CM | POA: Diagnosis not present

## 2019-05-01 DIAGNOSIS — F1029 Alcohol dependence with unspecified alcohol-induced disorder: Secondary | ICD-10-CM | POA: Diagnosis not present

## 2019-05-01 DIAGNOSIS — Z23 Encounter for immunization: Secondary | ICD-10-CM

## 2019-05-01 DIAGNOSIS — R000 Tachycardia, unspecified: Secondary | ICD-10-CM | POA: Diagnosis not present

## 2019-05-01 DIAGNOSIS — Z8349 Family history of other endocrine, nutritional and metabolic diseases: Secondary | ICD-10-CM

## 2019-05-01 DIAGNOSIS — Z79899 Other long term (current) drug therapy: Secondary | ICD-10-CM

## 2019-05-01 DIAGNOSIS — F319 Bipolar disorder, unspecified: Secondary | ICD-10-CM | POA: Diagnosis present

## 2019-05-01 DIAGNOSIS — R4020 Unspecified coma: Secondary | ICD-10-CM | POA: Diagnosis not present

## 2019-05-01 DIAGNOSIS — E512 Wernicke's encephalopathy: Secondary | ICD-10-CM | POA: Diagnosis present

## 2019-05-01 DIAGNOSIS — K5790 Diverticulosis of intestine, part unspecified, without perforation or abscess without bleeding: Secondary | ICD-10-CM | POA: Diagnosis present

## 2019-05-01 DIAGNOSIS — F1010 Alcohol abuse, uncomplicated: Secondary | ICD-10-CM | POA: Diagnosis not present

## 2019-05-01 DIAGNOSIS — E785 Hyperlipidemia, unspecified: Secondary | ICD-10-CM | POA: Diagnosis present

## 2019-05-01 DIAGNOSIS — Z9183 Wandering in diseases classified elsewhere: Secondary | ICD-10-CM

## 2019-05-01 DIAGNOSIS — R410 Disorientation, unspecified: Secondary | ICD-10-CM | POA: Diagnosis not present

## 2019-05-01 DIAGNOSIS — F0281 Dementia in other diseases classified elsewhere with behavioral disturbance: Secondary | ICD-10-CM | POA: Diagnosis present

## 2019-05-01 DIAGNOSIS — N179 Acute kidney failure, unspecified: Principal | ICD-10-CM | POA: Diagnosis present

## 2019-05-01 DIAGNOSIS — K219 Gastro-esophageal reflux disease without esophagitis: Secondary | ICD-10-CM | POA: Diagnosis present

## 2019-05-01 DIAGNOSIS — Z888 Allergy status to other drugs, medicaments and biological substances status: Secondary | ICD-10-CM

## 2019-05-01 DIAGNOSIS — F0390 Unspecified dementia without behavioral disturbance: Secondary | ICD-10-CM | POA: Diagnosis not present

## 2019-05-01 DIAGNOSIS — Z791 Long term (current) use of non-steroidal anti-inflammatories (NSAID): Secondary | ICD-10-CM

## 2019-05-01 DIAGNOSIS — Z03818 Encounter for observation for suspected exposure to other biological agents ruled out: Secondary | ICD-10-CM | POA: Diagnosis not present

## 2019-05-01 DIAGNOSIS — F1220 Cannabis dependence, uncomplicated: Secondary | ICD-10-CM | POA: Diagnosis not present

## 2019-05-01 DIAGNOSIS — Z20828 Contact with and (suspected) exposure to other viral communicable diseases: Secondary | ICD-10-CM | POA: Diagnosis present

## 2019-05-01 DIAGNOSIS — E860 Dehydration: Secondary | ICD-10-CM | POA: Diagnosis present

## 2019-05-01 DIAGNOSIS — R451 Restlessness and agitation: Secondary | ICD-10-CM | POA: Diagnosis not present

## 2019-05-01 DIAGNOSIS — R4182 Altered mental status, unspecified: Secondary | ICD-10-CM | POA: Diagnosis not present

## 2019-05-01 LAB — URINALYSIS, COMPLETE (UACMP) WITH MICROSCOPIC
Bacteria, UA: NONE SEEN
Bilirubin Urine: NEGATIVE
Glucose, UA: NEGATIVE mg/dL
Hgb urine dipstick: NEGATIVE
Ketones, ur: NEGATIVE mg/dL
Nitrite: NEGATIVE
Protein, ur: 30 mg/dL — AB
Specific Gravity, Urine: 1.024 (ref 1.005–1.030)
Waxy Casts, UA: 1
pH: 5 (ref 5.0–8.0)

## 2019-05-01 LAB — CBC
HCT: 43.3 % (ref 39.0–52.0)
Hemoglobin: 14.5 g/dL (ref 13.0–17.0)
MCH: 31 pg (ref 26.0–34.0)
MCHC: 33.5 g/dL (ref 30.0–36.0)
MCV: 92.7 fL (ref 80.0–100.0)
Platelets: 335 10*3/uL (ref 150–400)
RBC: 4.67 MIL/uL (ref 4.22–5.81)
RDW: 13 % (ref 11.5–15.5)
WBC: 8.4 10*3/uL (ref 4.0–10.5)
nRBC: 0 % (ref 0.0–0.2)

## 2019-05-01 LAB — COMPREHENSIVE METABOLIC PANEL
ALT: 30 U/L (ref 0–44)
AST: 40 U/L (ref 15–41)
Albumin: 4.8 g/dL (ref 3.5–5.0)
Alkaline Phosphatase: 70 U/L (ref 38–126)
Anion gap: 10 (ref 5–15)
BUN: 37 mg/dL — ABNORMAL HIGH (ref 6–20)
CO2: 27 mmol/L (ref 22–32)
Calcium: 9.9 mg/dL (ref 8.9–10.3)
Chloride: 101 mmol/L (ref 98–111)
Creatinine, Ser: 2.55 mg/dL — ABNORMAL HIGH (ref 0.61–1.24)
GFR calc Af Amer: 31 mL/min — ABNORMAL LOW (ref >60)
GFR calc non Af Amer: 26 mL/min — ABNORMAL LOW (ref >60)
Glucose, Bld: 105 mg/dL — ABNORMAL HIGH (ref 70–99)
Potassium: 3.8 mmol/L (ref 3.5–5.1)
Sodium: 138 mmol/L (ref 135–145)
Total Bilirubin: 1.3 mg/dL — ABNORMAL HIGH (ref 0.3–1.2)
Total Protein: 7.6 g/dL (ref 6.5–8.1)

## 2019-05-01 LAB — TROPONIN I (HIGH SENSITIVITY): Troponin I (High Sensitivity): 11 ng/L (ref <18)

## 2019-05-01 MED ORDER — SODIUM CHLORIDE 0.9 % IV BOLUS
1000.0000 mL | Freq: Once | INTRAVENOUS | Status: AC
  Administered 2019-05-01: 1000 mL via INTRAVENOUS

## 2019-05-01 MED ORDER — THIAMINE HCL 100 MG/ML IJ SOLN
Freq: Once | INTRAVENOUS | Status: AC
  Administered 2019-05-02: 03:00:00 via INTRAVENOUS
  Filled 2019-05-01: qty 1000
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
## 2019-05-01 NOTE — ED Triage Notes (Signed)
Pts wife to ED reporting pt has been acting very different lately. Pt has been walking around irregularly recently. Pts wife reporting he has walked into 4 different people's houses and anxiety has been worsening. Wife reporting pt is home alone when she is at work and she does not feel as though it is safe any longer.   Pts wife states, "This has just gotten to the point that something needs to be done. He has been to the behavioral hospital before and I think her needs to be there again."

## 2019-05-01 NOTE — ED Notes (Signed)
Full rainbow sent. Pts wife aware a urine sample is needed and will alert first nurse when pt can urinate.

## 2019-05-02 ENCOUNTER — Emergency Department: Payer: Medicare HMO

## 2019-05-02 ENCOUNTER — Other Ambulatory Visit: Payer: Self-pay

## 2019-05-02 DIAGNOSIS — F1029 Alcohol dependence with unspecified alcohol-induced disorder: Secondary | ICD-10-CM | POA: Diagnosis not present

## 2019-05-02 DIAGNOSIS — K5790 Diverticulosis of intestine, part unspecified, without perforation or abscess without bleeding: Secondary | ICD-10-CM | POA: Diagnosis present

## 2019-05-02 DIAGNOSIS — E860 Dehydration: Secondary | ICD-10-CM | POA: Diagnosis present

## 2019-05-02 DIAGNOSIS — F1220 Cannabis dependence, uncomplicated: Secondary | ICD-10-CM | POA: Diagnosis not present

## 2019-05-02 DIAGNOSIS — Z79899 Other long term (current) drug therapy: Secondary | ICD-10-CM | POA: Diagnosis not present

## 2019-05-02 DIAGNOSIS — Z23 Encounter for immunization: Secondary | ICD-10-CM | POA: Diagnosis present

## 2019-05-02 DIAGNOSIS — F319 Bipolar disorder, unspecified: Secondary | ICD-10-CM | POA: Diagnosis present

## 2019-05-02 DIAGNOSIS — R0600 Dyspnea, unspecified: Secondary | ICD-10-CM | POA: Diagnosis not present

## 2019-05-02 DIAGNOSIS — Z888 Allergy status to other drugs, medicaments and biological substances status: Secondary | ICD-10-CM | POA: Diagnosis not present

## 2019-05-02 DIAGNOSIS — R4182 Altered mental status, unspecified: Secondary | ICD-10-CM

## 2019-05-02 DIAGNOSIS — F0281 Dementia in other diseases classified elsewhere with behavioral disturbance: Secondary | ICD-10-CM | POA: Diagnosis present

## 2019-05-02 DIAGNOSIS — G3109 Other frontotemporal dementia: Secondary | ICD-10-CM | POA: Diagnosis present

## 2019-05-02 DIAGNOSIS — Z818 Family history of other mental and behavioral disorders: Secondary | ICD-10-CM | POA: Diagnosis not present

## 2019-05-02 DIAGNOSIS — F0390 Unspecified dementia without behavioral disturbance: Secondary | ICD-10-CM | POA: Diagnosis not present

## 2019-05-02 DIAGNOSIS — R410 Disorientation, unspecified: Secondary | ICD-10-CM

## 2019-05-02 DIAGNOSIS — J449 Chronic obstructive pulmonary disease, unspecified: Secondary | ICD-10-CM | POA: Diagnosis present

## 2019-05-02 DIAGNOSIS — Z9183 Wandering in diseases classified elsewhere: Secondary | ICD-10-CM | POA: Diagnosis not present

## 2019-05-02 DIAGNOSIS — N179 Acute kidney failure, unspecified: Secondary | ICD-10-CM | POA: Diagnosis present

## 2019-05-02 DIAGNOSIS — E785 Hyperlipidemia, unspecified: Secondary | ICD-10-CM | POA: Diagnosis present

## 2019-05-02 DIAGNOSIS — K219 Gastro-esophageal reflux disease without esophagitis: Secondary | ICD-10-CM | POA: Diagnosis present

## 2019-05-02 DIAGNOSIS — Z8349 Family history of other endocrine, nutritional and metabolic diseases: Secondary | ICD-10-CM | POA: Diagnosis not present

## 2019-05-02 DIAGNOSIS — Z20828 Contact with and (suspected) exposure to other viral communicable diseases: Secondary | ICD-10-CM | POA: Diagnosis present

## 2019-05-02 DIAGNOSIS — F1010 Alcohol abuse, uncomplicated: Secondary | ICD-10-CM | POA: Diagnosis present

## 2019-05-02 DIAGNOSIS — Z87891 Personal history of nicotine dependence: Secondary | ICD-10-CM | POA: Diagnosis not present

## 2019-05-02 DIAGNOSIS — Z791 Long term (current) use of non-steroidal anti-inflammatories (NSAID): Secondary | ICD-10-CM | POA: Diagnosis not present

## 2019-05-02 DIAGNOSIS — R4020 Unspecified coma: Secondary | ICD-10-CM | POA: Diagnosis not present

## 2019-05-02 DIAGNOSIS — E512 Wernicke's encephalopathy: Secondary | ICD-10-CM | POA: Diagnosis present

## 2019-05-02 DIAGNOSIS — Z8711 Personal history of peptic ulcer disease: Secondary | ICD-10-CM | POA: Diagnosis not present

## 2019-05-02 LAB — CBC
HCT: 36.8 % — ABNORMAL LOW (ref 39.0–52.0)
Hemoglobin: 12.3 g/dL — ABNORMAL LOW (ref 13.0–17.0)
MCH: 31 pg (ref 26.0–34.0)
MCHC: 33.4 g/dL (ref 30.0–36.0)
MCV: 92.7 fL (ref 80.0–100.0)
Platelets: 266 10*3/uL (ref 150–400)
RBC: 3.97 MIL/uL — ABNORMAL LOW (ref 4.22–5.81)
RDW: 13 % (ref 11.5–15.5)
WBC: 5.7 10*3/uL (ref 4.0–10.5)
nRBC: 0 % (ref 0.0–0.2)

## 2019-05-02 LAB — COMPREHENSIVE METABOLIC PANEL
ALT: 23 U/L (ref 0–44)
AST: 31 U/L (ref 15–41)
Albumin: 3.7 g/dL (ref 3.5–5.0)
Alkaline Phosphatase: 57 U/L (ref 38–126)
Anion gap: 8 (ref 5–15)
BUN: 38 mg/dL — ABNORMAL HIGH (ref 6–20)
CO2: 27 mmol/L (ref 22–32)
Calcium: 8.4 mg/dL — ABNORMAL LOW (ref 8.9–10.3)
Chloride: 106 mmol/L (ref 98–111)
Creatinine, Ser: 2.18 mg/dL — ABNORMAL HIGH (ref 0.61–1.24)
GFR calc Af Amer: 37 mL/min — ABNORMAL LOW (ref >60)
GFR calc non Af Amer: 32 mL/min — ABNORMAL LOW (ref >60)
Glucose, Bld: 106 mg/dL — ABNORMAL HIGH (ref 70–99)
Potassium: 3.3 mmol/L — ABNORMAL LOW (ref 3.5–5.1)
Sodium: 141 mmol/L (ref 135–145)
Total Bilirubin: 0.7 mg/dL (ref 0.3–1.2)
Total Protein: 6.2 g/dL — ABNORMAL LOW (ref 6.5–8.1)

## 2019-05-02 LAB — URINE DRUG SCREEN, QUALITATIVE (ARMC ONLY)
Amphetamines, Ur Screen: NOT DETECTED
Barbiturates, Ur Screen: NOT DETECTED
Benzodiazepine, Ur Scrn: NOT DETECTED
Cannabinoid 50 Ng, Ur ~~LOC~~: POSITIVE — AB
Cocaine Metabolite,Ur ~~LOC~~: NOT DETECTED
MDMA (Ecstasy)Ur Screen: NOT DETECTED
Methadone Scn, Ur: NOT DETECTED
Opiate, Ur Screen: NOT DETECTED
Phencyclidine (PCP) Ur S: NOT DETECTED
Tricyclic, Ur Screen: NOT DETECTED

## 2019-05-02 LAB — AMMONIA: Ammonia: 21 umol/L (ref 9–35)

## 2019-05-02 LAB — MAGNESIUM: Magnesium: 1.8 mg/dL (ref 1.7–2.4)

## 2019-05-02 LAB — ETHANOL: Alcohol, Ethyl (B): <10 mg/dL (ref <10)

## 2019-05-02 LAB — CK
Total CK: 671 U/L — ABNORMAL HIGH (ref 49–397)
Total CK: 614 U/L — ABNORMAL HIGH (ref 49–397)

## 2019-05-02 LAB — SALICYLATE LEVEL: Salicylate Lvl: <7 mg/dL (ref 2.8–30.0)

## 2019-05-02 LAB — TSH: TSH: 0.758 u[IU]/mL (ref 0.350–4.500)

## 2019-05-02 LAB — ACETAMINOPHEN LEVEL: Acetaminophen (Tylenol), Serum: <10 ug/mL — ABNORMAL LOW (ref 10–30)

## 2019-05-02 LAB — SARS CORONAVIRUS 2 (TAT 6-24 HRS): SARS Coronavirus 2: NEGATIVE

## 2019-05-02 LAB — PHOSPHORUS: Phosphorus: 3 mg/dL (ref 2.5–4.6)

## 2019-05-02 MED ORDER — THIAMINE HCL 100 MG/ML IJ SOLN
500.0000 mg | Freq: Three times a day (TID) | INTRAVENOUS | Status: AC
  Administered 2019-05-02 – 2019-05-03 (×6): 500 mg via INTRAVENOUS
  Filled 2019-05-02 (×7): qty 5

## 2019-05-02 MED ORDER — HEPARIN SODIUM (PORCINE) 5000 UNIT/ML IJ SOLN
5000.0000 [IU] | Freq: Three times a day (TID) | INTRAMUSCULAR | Status: DC
  Administered 2019-05-02 – 2019-05-05 (×9): 5000 [IU] via SUBCUTANEOUS
  Filled 2019-05-02 (×9): qty 1

## 2019-05-02 MED ORDER — FOLIC ACID 1 MG PO TABS
1.0000 mg | ORAL_TABLET | Freq: Every day | ORAL | Status: DC
  Administered 2019-05-02 – 2019-05-05 (×4): 1 mg via ORAL
  Filled 2019-05-02 (×4): qty 1

## 2019-05-02 MED ORDER — LISINOPRIL-HYDROCHLOROTHIAZIDE 20-12.5 MG PO TABS: 2.0000 | ORAL_TABLET | Freq: Every day | ORAL | Status: DC

## 2019-05-02 MED ORDER — ONDANSETRON HCL 4 MG/2ML IJ SOLN
4.0000 mg | Freq: Four times a day (QID) | INTRAMUSCULAR | Status: DC | PRN
  Administered 2019-05-05: 09:00:00 4 mg via INTRAVENOUS
  Filled 2019-05-02: qty 2

## 2019-05-02 MED ORDER — HYDROCHLOROTHIAZIDE 25 MG PO TABS: 25.0000 mg | ORAL_TABLET | Freq: Every day | ORAL | Status: DC

## 2019-05-02 MED ORDER — THIAMINE HCL 100 MG/ML IJ SOLN: 100.0000 mg | Freq: Every day | INTRAMUSCULAR | Status: DC

## 2019-05-02 MED ORDER — VITAMIN B-1 100 MG PO TABS: 100.0000 mg | ORAL_TABLET | Freq: Every day | ORAL | Status: DC

## 2019-05-02 MED ORDER — ESCITALOPRAM OXALATE 10 MG PO TABS
5.0000 mg | ORAL_TABLET | Freq: Every day | ORAL | Status: DC
  Administered 2019-05-02 – 2019-05-04 (×3): 5 mg via ORAL
  Filled 2019-05-02 (×4): qty 0.5

## 2019-05-02 MED ORDER — OLANZAPINE 7.5 MG PO TABS
15.0000 mg | ORAL_TABLET | Freq: Every day | ORAL | Status: DC
  Administered 2019-05-02 – 2019-05-04 (×3): 15 mg via ORAL
  Filled 2019-05-02 (×4): qty 2

## 2019-05-02 MED ORDER — TRAZODONE HCL 50 MG PO TABS
50.0000 mg | ORAL_TABLET | Freq: Two times a day (BID) | ORAL | Status: DC | PRN
  Administered 2019-05-02 – 2019-05-04 (×3): 50 mg via ORAL
  Filled 2019-05-02 (×3): qty 1

## 2019-05-02 MED ORDER — POTASSIUM CHLORIDE CRYS ER 20 MEQ PO TBCR
40.0000 meq | EXTENDED_RELEASE_TABLET | Freq: Once | ORAL | Status: AC
  Administered 2019-05-02: 40 meq via ORAL
  Filled 2019-05-02: qty 2

## 2019-05-02 MED ORDER — LORAZEPAM 2 MG/ML IJ SOLN
1.0000 mg | INTRAMUSCULAR | Status: DC | PRN
  Administered 2019-05-02: 2 mg via INTRAVENOUS
  Administered 2019-05-03 (×2): 1 mg via INTRAVENOUS
  Filled 2019-05-02 (×3): qty 1

## 2019-05-02 MED ORDER — ONDANSETRON HCL 4 MG PO TABS: 4.0000 mg | ORAL_TABLET | Freq: Four times a day (QID) | ORAL | Status: DC | PRN

## 2019-05-02 MED ORDER — NICOTINE 21 MG/24HR TD PT24
21.0000 mg | MEDICATED_PATCH | Freq: Every day | TRANSDERMAL | Status: DC
  Administered 2019-05-02 – 2019-05-05 (×4): 21 mg via TRANSDERMAL
  Filled 2019-05-02 (×4): qty 1

## 2019-05-02 MED ORDER — VITAMIN B-1 100 MG PO TABS
100.0000 mg | ORAL_TABLET | Freq: Every day | ORAL | Status: DC
  Administered 2019-05-02: 10:00:00 100 mg via ORAL
  Filled 2019-05-02: qty 1

## 2019-05-02 MED ORDER — VITAMIN B-1 100 MG PO TABS
250.0000 mg | ORAL_TABLET | Freq: Every day | ORAL | Status: DC
  Administered 2019-05-04 – 2019-05-05 (×2): 250 mg via ORAL
  Filled 2019-05-02 (×2): qty 3

## 2019-05-02 MED ORDER — DOCUSATE SODIUM 100 MG PO CAPS
100.0000 mg | ORAL_CAPSULE | Freq: Two times a day (BID) | ORAL | Status: DC
  Administered 2019-05-02 – 2019-05-05 (×7): 100 mg via ORAL
  Filled 2019-05-02 (×7): qty 1

## 2019-05-02 MED ORDER — SODIUM CHLORIDE 0.9 % IV SOLN
INTRAVENOUS | Status: DC
  Administered 2019-05-02 – 2019-05-03 (×3): via INTRAVENOUS

## 2019-05-02 MED ORDER — LORAZEPAM 1 MG PO TABS: 1.0000 mg | ORAL_TABLET | ORAL | Status: DC | PRN

## 2019-05-02 MED ORDER — ZIPRASIDONE MESYLATE 20 MG IM SOLR
10.0000 mg | Freq: Four times a day (QID) | INTRAMUSCULAR | Status: DC | PRN
  Filled 2019-05-02: qty 20

## 2019-05-02 MED ORDER — ENSURE ENLIVE PO LIQD
237.0000 mL | Freq: Three times a day (TID) | ORAL | Status: DC
  Administered 2019-05-02 – 2019-05-05 (×8): 237 mL via ORAL

## 2019-05-02 MED ORDER — ADULT MULTIVITAMIN W/MINERALS CH
1.0000 | ORAL_TABLET | Freq: Every day | ORAL | Status: DC
  Administered 2019-05-02 – 2019-05-05 (×4): 1 via ORAL
  Filled 2019-05-02 (×4): qty 1

## 2019-05-02 MED ORDER — LISINOPRIL 20 MG PO TABS: 40.0000 mg | ORAL_TABLET | Freq: Every day | ORAL | Status: DC

## 2019-05-02 MED ORDER — ENOXAPARIN SODIUM 40 MG/0.4ML ~~LOC~~ SOLN
40.0000 mg | SUBCUTANEOUS | Status: DC
  Administered 2019-05-02: 06:00:00 40 mg via SUBCUTANEOUS
  Filled 2019-05-02: qty 0.4

## 2019-05-02 NOTE — Progress Notes (Signed)
[facility] [place] NAME: Ian Newman    MR#:  [id number]  DATE OF BIRTH:  [birth year]  SUBJECTIVE:  CHIEF COMPLAINT:   Chief Complaint  Patient presents with  . Dementia  . Altered Mental Status   Brought with confusion, anxiety. He is confused. Spoke to wife. He have confusion fo last 3-4 mths getting worse. He also smokes a lot as per wife. Last night he was aggressive and walking on street.  REVIEW OF SYSTEMS:   Due to confusion, he can not give details.  ROS  DRUG ALLERGIES:   Allergies  Allergen Reactions  . Hydroxyzine Other (See Comments)    "makes me feel weird and strange inside"  . Pepcid [Famotidine] Itching    VITALS:  Blood pressure 103/81, pulse 80, temperature 97.8 F (36.6 C), temperature source Oral, resp. rate 18, weight 111.1 kg, SpO2 96 %.  PHYSICAL EXAMINATION:  GENERAL:  58 y.o.-year-old patient lying in the bed with no acute distress.  EYES: Pupils equal, round, reactive to light and accommodation. No scleral icterus. Extraocular muscles intact.  HEENT: Head atraumatic, normocephalic. Oropharynx and nasopharynx clear.  NECK:  Supple, no jugular venous distention. No thyroid enlargement, no tenderness.  LUNGS: Normal breath sounds bilaterally, no wheezing, rales,rhonchi or crepitation. No use of accessory muscles of respiration.  CARDIOVASCULAR: S1, S2 normal. No murmurs, rubs, or gallops.  ABDOMEN: Soft, nontender, nondistended. Bowel sounds present. No organomegaly or mass.  EXTREMITIES: No pedal edema, cyanosis, or clubbing.  NEUROLOGIC: Cranial nerves II through XII are intact. Muscle strength 4/5 in all extremities. Sensation intact. Gait not checked.  PSYCHIATRIC: The patient is alert and oriented x 1.  SKIN: No obvious rash, lesion, or ulcer.   Physical Exam LABORATORY PANEL:   CBC Recent Labs  Lab 05/02/19 0938  WBC 5.7  HGB 12.3*  HCT 36.8*  PLT 266    ------------------------------------------------------------------------------------------------------------------  Chemistries  Recent Labs  Lab 05/02/19 0938  NA 141  K 3.3*  CL 106  CO2 27  GLUCOSE 106*  BUN 38*  CREATININE 2.18*  CALCIUM 8.4*  MG 1.8  AST 31  ALT 23  ALKPHOS 57  BILITOT 0.7   ------------------------------------------------------------------------------------------------------------------  Cardiac Enzymes No results for input(s): TROPONINI in the last 168 hours. ------------------------------------------------------------------------------------------------------------------  RADIOLOGY:  Dg Chest 2 View  Result Date: 05/02/2019 CLINICAL DATA:  Dyspnea EXAM: CHEST - 2 VIEW COMPARISON:  04/26/2019 FINDINGS: No focal opacity or pleural effusion. Normal heart size. No pneumothorax. Chronic wedging at L1 IMPRESSION: No active cardiopulmonary disease. Electronically Signed   By: Donavan Foil M.D.   On: 05/02/2019 01:43   Ct Head Wo Contrast  Result Date: 05/02/2019 CLINICAL DATA:  Altered level of consciousness, history of dementia EXAM: CT HEAD WITHOUT CONTRAST TECHNIQUE: Contiguous axial images were obtained from the base of the skull through the vertex without intravenous contrast. COMPARISON:  CT 09/14/2018 FINDINGS: Brain: No evidence of acute infarction, hemorrhage, hydrocephalus, extra-axial collection or mass lesion/mass effect. Age advanced mild parenchymal volume loss. Vascular: Atherosclerotic calcification of the carotid siphons and intradural vertebral arteries. No hyperdense vessel. Skull: No calvarial fracture or suspicious osseous lesion. No scalp swelling or hematoma. Sinuses/Orbits: Mild mural thickening within the ethmoid air cells. Paranasal sinuses and mastoid air cells are otherwise predominantly clear without air-fluid levels. Included orbital structures are unremarkable. Other: Mostly edentulous with mandibular prognathism. IMPRESSION: No  acute intracranial abnormality. Stable, slightly age advanced mild parenchymal volume loss. Electronically Signed   By:  Lovena Le M.D.   On: 05/02/2019 01:39    ASSESSMENT AND PLAN:   Active Problems:   AKI (acute kidney injury) ([place])   Confusion  This is a 58 year old male admitted for acute kidney injury. 1.  AKI: Unclear etiology.  May be secondary to decrease overall water intake as per the wife.   Continue IV fluid and follow renal function.  Avoid nephrotoxic agents.  2.  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  I suspect the patient does have some Wernicke's encephalopathy.  I have ordered high-dose thiamine with dextrose for now.  CT scan shows stable but slightly advanced mild parenchymal volume loss for age.  3.  Alcohol abuse: Ethanol level below detection at this time.  Initiate CIWA scale with necessary I spoke to his wife and as per her he did stop drinking alcohol few months ago.  4.  COPD: Stable; continue inhaled corticosteroid.  Albuterol as needed. 5.  DVT prophylaxis: Heparin 6.  GI prophylaxis: None     All the records are reviewed and case discussed with Care Management/Social Workerr. Management plans discussed with the patient, family and they are in agreement.  CODE STATUS: Full  TOTAL TIME TAKING CARE OF THIS PATIENT: 35 minutes.     POSSIBLE D/C IN 1-2 DAYS, DEPENDING ON CLINICAL CONDITION.   Vaughan Basta M.D on 05/02/2019   Between 7am to 6pm - Pager - [phone]  After 6pm go to [email or web address] - password [email or web address] [facility]  Office  [phone]  CC: Primary care physician; Kirk Ruths, MD  Note: This dictation was prepared with Dragon dictation along with smaller phrase technology. Any transcriptional errors that result from this process are unintentional.

## 2019-05-02 NOTE — ED Notes (Signed)
Please note respiratory status changed during administration of IVF. EDP notified of increasingly noisy respirations.

## 2019-05-02 NOTE — Progress Notes (Addendum)
Initial Nutrition Assessment  DOCUMENTATION CODES:   Obesity unspecified  INTERVENTION:   Ensure Enlive po TID, each supplement provides 350 kcal and 20 grams of protein  MVI, thiamine and folic acid in setting of etoh abuse  Liberalize diet   Pt at high refeed risk; recommend monitor K, Mg and P labs  NUTRITION DIAGNOSIS:   Inadequate oral intake related to acute illness(AMS) as evidenced by other (comment)(Per chart review).  GOAL:   Patient will meet greater than or equal to 90% of their needs  MONITOR:   PO intake, Supplement acceptance, Labs, Weight trends, Skin, I & O's  REASON FOR ASSESSMENT:   Malnutrition Screening Tool    ASSESSMENT:   58 y/o male with h/o etoh abuse, dementia, GERD, duodenal ulcers, bipolar disorder, COPD admitted with AMS   Unable to speak with pt r/t AMS. RD suspects patient with poor appetite and oral intake at baseline at times when he is drinking. RD will add supplements and liberalize diet to help pt meet his estimated needs. Pt is on CIWA; pt receiving high dose IV thiamine in setting of possible Wernicke's. Recommend monitor electrolytes as pt is at high refeed risk. Per chart, pt appears weight stable at baseline.   Medications reviewed and include: Colace, folic acid, heparin, MVI, thiamine, NaCl @125ml /hr   Labs reviewed: K 3.3(L), BUN 38(H), creat 1.8 wnl, P 3.0 wnl, Mg 1.8 wnl  NUTRITION - FOCUSED PHYSICAL EXAM:    Most Recent Value  Orbital Region  No depletion  Upper Arm Region  No depletion  Thoracic and Lumbar Region  No depletion  Buccal Region  No depletion  Temple Region  No depletion  Clavicle Bone Region  No depletion  Clavicle and Acromion Bone Region  No depletion  Scapular Bone Region  No depletion  Dorsal Hand  No depletion  Patellar Region  No depletion  Anterior Thigh Region  No depletion  Posterior Calf Region  No depletion  Edema (RD Assessment)  None  Hair  Reviewed  Eyes  Reviewed  Mouth  Reviewed   Skin  Reviewed  Nails  Reviewed     Diet Order:   Diet Order            Diet Heart Room service appropriate? Yes; Fluid consistency: Thin  Diet effective now             EDUCATION NEEDS:   No education needs have been identified at this time  Skin:  Skin Assessment: Reviewed RN Assessment  Last BM:  pta  Height:   Ht Readings from Last 1 Encounters:  04/26/19 6' (1.829 m)    Weight:   Wt Readings from Last 1 Encounters:  05/01/19 111.1 kg    Ideal Body Weight:  80.9 kg  BMI:  Body mass index is 33.22 kg/m.  Estimated Nutritional Needs:   Kcal:  2200-2500kcal/day  Protein:  110-125g/day  Fluid:  >2.4L/day  Koleen Distance MS, RD, LDN Pager #- [phone] Office#- [phone] After Hours Pager: [phone]
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
## 2019-05-02 NOTE — Plan of Care (Signed)
  Problem: Health Behavior/Discharge Planning: Goal: Ability to manage health-related needs will improve Outcome: Progressing   Problem: Clinical Measurements: Goal: Ability to maintain clinical measurements within normal limits will improve Outcome: Progressing Goal: Will remain free from infection Outcome: Progressing Goal: Diagnostic test results will improve Outcome: Progressing Goal: Respiratory complications will improve Outcome: Progressing   Problem: Activity: Goal: Risk for activity intolerance will decrease Outcome: Progressing   Problem: Nutrition: Goal: Adequate nutrition will be maintained Outcome: Progressing   Problem: Elimination: Goal: Will not experience complications related to bowel motility Outcome: Progressing   Problem: Pain Managment: Goal: General experience of comfort will improve Outcome: Progressing   Problem: Safety: Goal: Ability to remain free from injury will improve Outcome: Progressing   Problem: Skin Integrity: Goal: Risk for impaired skin integrity will decrease Outcome: Progressing   
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
## 2019-05-02 NOTE — ED Notes (Signed)
Patient transported to X-ray 

## 2019-05-02 NOTE — ED Notes (Signed)
Patient transported to CT 

## 2019-05-02 NOTE — Progress Notes (Signed)
Pt confused.  Alert to self and place.  Follow commends , but need constant redirecting.  Ativan given once for CIWA score 11.  Telesitter ordered, but cancelled.  Pt repeatedly tries get oob, pulled IV out.  Pt is restless, does not seem agitated nor anxious.  Noted urinary frequency.  Pt wants to sit in the recliner and ambulates with standby assist.  Sitter 1:1 ordered.
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
## 2019-05-03 ENCOUNTER — Other Ambulatory Visit: Payer: Self-pay

## 2019-05-03 LAB — BASIC METABOLIC PANEL
Anion gap: 9 (ref 5–15)
BUN: 28 mg/dL — ABNORMAL HIGH (ref 6–20)
CO2: 26 mmol/L (ref 22–32)
Calcium: 8.5 mg/dL — ABNORMAL LOW (ref 8.9–10.3)
Chloride: 104 mmol/L (ref 98–111)
Creatinine, Ser: 1.36 mg/dL — ABNORMAL HIGH (ref 0.61–1.24)
GFR calc Af Amer: >60 mL/min (ref >60)
GFR calc non Af Amer: 57 mL/min — ABNORMAL LOW (ref >60)
Glucose, Bld: 97 mg/dL (ref 70–99)
Potassium: 4 mmol/L (ref 3.5–5.1)
Sodium: 139 mmol/L (ref 135–145)

## 2019-05-03 MED ORDER — AMLODIPINE BESYLATE 10 MG PO TABS
10.0000 mg | ORAL_TABLET | Freq: Every day | ORAL | Status: DC
  Administered 2019-05-03 – 2019-05-05 (×3): 10 mg via ORAL
  Filled 2019-05-03 (×3): qty 1

## 2019-05-03 MED ORDER — ACETAMINOPHEN 325 MG PO TABS
650.0000 mg | ORAL_TABLET | Freq: Four times a day (QID) | ORAL | Status: DC | PRN
  Administered 2019-05-03 – 2019-05-05 (×2): 650 mg via ORAL
  Filled 2019-05-03 (×2): qty 2

## 2019-05-03 MED ORDER — INFLUENZA VAC SPLIT QUAD 0.5 ML IM SUSY
0.5000 mL | PREFILLED_SYRINGE | INTRAMUSCULAR | Status: AC
  Administered 2019-05-05: 11:00:00 0.5 mL via INTRAMUSCULAR
  Filled 2019-05-03 (×2): qty 0.5

## 2019-05-03 NOTE — Progress Notes (Signed)
[place] at [place] NAME: Ian Newman    MR#:  [id number]  DATE OF BIRTH:  February 01, 1961  SUBJECTIVE:  CHIEF COMPLAINT:   Chief Complaint  Patient presents with  . Dementia  . Altered Mental Status   Brought with confusion, anxiety. He is confused. Spoke to wife. He have confusion fo last 3-4 mths getting worse. He also smokes a lot as per wife. Last night he was aggressive and walking on street.  More calm today, but appears confused.  REVIEW OF SYSTEMS:   Due to confusion, he can not give details.  ROS  DRUG ALLERGIES:   Allergies  Allergen Reactions  . Hydroxyzine Other (See Comments)    "makes me feel weird and strange inside"  . Pepcid [Famotidine] Itching    VITALS:  Blood pressure (!) 172/108, pulse 87, temperature 98.1 F (36.7 C), temperature source Oral, resp. rate 18, height 6' (1.829 m), weight 104.3 kg, SpO2 98 %.  PHYSICAL EXAMINATION:  GENERAL:  58 y.o.-year-old patient lying in the bed with no acute distress.  EYES: Pupils equal, round, reactive to light and accommodation. No scleral icterus. Extraocular muscles intact.  HEENT: Head atraumatic, normocephalic. Oropharynx and nasopharynx clear.  NECK:  Supple, no jugular venous distention. No thyroid enlargement, no tenderness.  LUNGS: Normal breath sounds bilaterally, no wheezing, rales,rhonchi or crepitation. No use of accessory muscles of respiration.  CARDIOVASCULAR: S1, S2 normal. No murmurs, rubs, or gallops.  ABDOMEN: Soft, nontender, nondistended. Bowel sounds present. No organomegaly or mass.  EXTREMITIES: No pedal edema, cyanosis, or clubbing.  NEUROLOGIC: Cranial nerves II through XII are intact. Muscle strength 4/5 in all extremities. Sensation intact. Gait not checked.  PSYCHIATRIC: The patient is alert and oriented x 1.  SKIN: No obvious rash, lesion, or ulcer.   Physical Exam LABORATORY PANEL:   CBC Recent Labs  Lab 05/02/19 0938  WBC 5.7  HGB  12.3*  HCT 36.8*  PLT 266   ------------------------------------------------------------------------------------------------------------------  Chemistries  Recent Labs  Lab 05/02/19 0938 05/03/19 0820  NA 141 139  K 3.3* 4.0  CL 106 104  CO2 27 26  GLUCOSE 106* 97  BUN 38* 28*  CREATININE 2.18* 1.36*  CALCIUM 8.4* 8.5*  MG 1.8  --   AST 31  --   ALT 23  --   ALKPHOS 57  --   BILITOT 0.7  --    ------------------------------------------------------------------------------------------------------------------  Cardiac Enzymes No results for input(s): TROPONINI in the last 168 hours. ------------------------------------------------------------------------------------------------------------------  RADIOLOGY:  Dg Chest 2 View  Result Date: 05/02/2019 CLINICAL DATA:  Dyspnea EXAM: CHEST - 2 VIEW COMPARISON:  04/26/2019 FINDINGS: No focal opacity or pleural effusion. Normal heart size. No pneumothorax. Chronic wedging at L1 IMPRESSION: No active cardiopulmonary disease. Electronically Signed   By: Donavan Foil M.D.   On: 05/02/2019 01:43   Ct Head Wo Contrast  Result Date: 05/02/2019 CLINICAL DATA:  Altered level of consciousness, history of dementia EXAM: CT HEAD WITHOUT CONTRAST TECHNIQUE: Contiguous axial images were obtained from the base of the skull through the vertex without intravenous contrast. COMPARISON:  CT 09/14/2018 FINDINGS: Brain: No evidence of acute infarction, hemorrhage, hydrocephalus, extra-axial collection or mass lesion/mass effect. Age advanced mild parenchymal volume loss. Vascular: Atherosclerotic calcification of the carotid siphons and intradural vertebral arteries. No hyperdense vessel. Skull: No calvarial fracture or suspicious osseous lesion. No scalp swelling or hematoma. Sinuses/Orbits: Mild mural thickening within the ethmoid air cells. Paranasal sinuses and mastoid air cells  are otherwise predominantly clear without air-fluid levels. Included  orbital structures are unremarkable. Other: Mostly edentulous with mandibular prognathism. IMPRESSION: No acute intracranial abnormality. Stable, slightly age advanced mild parenchymal volume loss. Electronically Signed   By: Lovena Le M.D.   On: 05/02/2019 01:39    ASSESSMENT AND PLAN:   Active Problems:   AKI (acute kidney injury) ([place])   Confusion  This is a 58 year old male admitted for acute kidney injury.  1.  AKI: Unclear etiology.  May be secondary to decrease overall water intake as per the wife.   Continue IV fluid and follow renal function.  Avoid nephrotoxic agents. Improved.  2.  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  I suspect the patient does have some Wernicke's encephalopathy.  I have ordered high-dose thiamine with dextrose for now.  CT scan shows stable but slightly advanced mild parenchymal volume loss for age.  3.  Alcohol abuse: Ethanol level below detection at this time.  Initiate CIWA scale with necessary I spoke to his wife and as per her he did stop drinking alcohol few months ago.  4.  COPD: Stable; continue inhaled corticosteroid.  Albuterol as needed. 5.  DVT prophylaxis: Heparin 6.  GI prophylaxis: None  He might need arrangements for placement for increased services on discharge.   All the records are reviewed and case discussed with Care Management/Social Workerr. Management plans discussed with the patient, family and they are in agreement.  CODE STATUS: Full  TOTAL TIME TAKING CARE OF THIS PATIENT: 35 minutes.    POSSIBLE D/C IN 1-2 DAYS, DEPENDING ON CLINICAL CONDITION.   Vaughan Basta M.D on 05/03/2019   Between 7am to 6pm - Pager - [phone]  After 6pm go to [email or web address] - password [email or web address] [facility]  Office  [phone]  CC: Primary care physician; Kirk Ruths, MD  Note: This dictation was prepared with  Dragon dictation along with smaller phrase technology. Any transcriptional errors that result from this process are unintentional.

## 2019-05-04 LAB — BASIC METABOLIC PANEL
Anion gap: 8 (ref 5–15)
BUN: 23 mg/dL — ABNORMAL HIGH (ref 6–20)
CO2: 29 mmol/L (ref 22–32)
Calcium: 9 mg/dL (ref 8.9–10.3)
Chloride: 104 mmol/L (ref 98–111)
Creatinine, Ser: 0.99 mg/dL (ref 0.61–1.24)
GFR calc Af Amer: >60 mL/min (ref >60)
GFR calc non Af Amer: >60 mL/min (ref >60)
Glucose, Bld: 99 mg/dL (ref 70–99)
Potassium: 3.9 mmol/L (ref 3.5–5.1)
Sodium: 141 mmol/L (ref 135–145)

## 2019-05-04 MED ORDER — LORAZEPAM 2 MG/ML IJ SOLN
1.0000 mg | INTRAMUSCULAR | Status: DC | PRN
  Administered 2019-05-04: 1 mg via INTRAVENOUS
  Filled 2019-05-04: qty 1

## 2019-05-04 NOTE — Progress Notes (Signed)
[place] at [place] NAME: Ian Newman    MR#:  [id number]  DATE OF BIRTH:  Nov 23, 1960  SUBJECTIVE:  CHIEF COMPLAINT:   Chief Complaint  Patient presents with  . Dementia  . Altered Mental Status   Brought with confusion, anxiety. He is confused. Spoke to wife. He have confusion fo last 3-4 mths getting worse. He also smokes a lot as per wife. Last night he was aggressive and walking on street.  More calm today, but appears confused.  Sitter is in the room.  REVIEW OF SYSTEMS:   Due to confusion, he can not give details.  ROS  DRUG ALLERGIES:   Allergies  Allergen Reactions  . Hydroxyzine Other (See Comments)    "makes me feel weird and strange inside"  . Pepcid [Famotidine] Itching    VITALS:  Blood pressure (!) 142/97, pulse 92, temperature 97.6 F (36.4 C), temperature source Oral, resp. rate 18, height 6' (1.829 m), weight 104.3 kg, SpO2 94 %.  PHYSICAL EXAMINATION:  GENERAL:  58 y.o.-year-old patient lying in the bed with no acute distress.  EYES: Pupils equal, round, reactive to light and accommodation. No scleral icterus. Extraocular muscles intact.  HEENT: Head atraumatic, normocephalic. Oropharynx and nasopharynx clear.  NECK:  Supple, no jugular venous distention. No thyroid enlargement, no tenderness.  LUNGS: Normal breath sounds bilaterally, no wheezing, rales,rhonchi or crepitation. No use of accessory muscles of respiration.  CARDIOVASCULAR: S1, S2 normal. No murmurs, rubs, or gallops.  ABDOMEN: Soft, nontender, nondistended. Bowel sounds present. No organomegaly or mass.  EXTREMITIES: No pedal edema, cyanosis, or clubbing.  NEUROLOGIC: Cranial nerves II through XII are intact. Muscle strength 4/5 in all extremities. Sensation intact. Gait not checked.  PSYCHIATRIC: The patient is alert and oriented x 1.  SKIN: No obvious rash, lesion, or ulcer.   Physical Exam LABORATORY PANEL:   CBC Recent Labs  Lab  05/02/19 0938  WBC 5.7  HGB 12.3*  HCT 36.8*  PLT 266   ------------------------------------------------------------------------------------------------------------------  Chemistries  Recent Labs  Lab 05/02/19 0938  05/04/19 0436  NA 141   < > 141  K 3.3*   < > 3.9  CL 106   < > 104  CO2 27   < > 29  GLUCOSE 106*   < > 99  BUN 38*   < > 23*  CREATININE 2.18*   < > 0.99  CALCIUM 8.4*   < > 9.0  MG 1.8  --   --   AST 31  --   --   ALT 23  --   --   ALKPHOS 57  --   --   BILITOT 0.7  --   --    < > = values in this interval not displayed.   ------------------------------------------------------------------------------------------------------------------  Cardiac Enzymes No results for input(s): TROPONINI in the last 168 hours. ------------------------------------------------------------------------------------------------------------------  RADIOLOGY:  No results found.  ASSESSMENT AND PLAN:   Active Problems:   AKI (acute kidney injury) ([place])   Confusion  This is a 58 year old male admitted for acute kidney injury.  1.  AKI: Unclear etiology.  May be secondary to decrease overall water intake as per the wife.   Continue IV fluid and follow renal function.  Avoid nephrotoxic agents. Improved.  2.  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  I suspect the patient does have some Wernicke's encephalopathy.  I have ordered high-dose  thiamine with dextrose for now.  CT scan shows stable but slightly advanced mild parenchymal volume loss for age. Seen by psychiatrist.  3.  Alcohol abuse: Ethanol level below detection at this time.  Initiate CIWA scale with necessary I spoke to his wife and as per her he did stop drinking alcohol few months ago.  4.  COPD: Stable; continue inhaled corticosteroid.  Albuterol as needed. 5.  DVT prophylaxis: Heparin 6.  GI prophylaxis: None  He might need  arrangements for placement or increased services on discharge.   All the records are reviewed and case discussed with Care Management/Social Workerr. Management plans discussed with the patient, family and they are in agreement.  CODE STATUS: Full  TOTAL TIME TAKING CARE OF THIS PATIENT: 35 minutes.    POSSIBLE D/C IN 1-2 DAYS, DEPENDING ON CLINICAL CONDITION.   Vaughan Basta M.D on 05/04/2019   Between 7am to 6pm - Pager - [phone]  After 6pm go to [email or web address] - password [email or web address] [facility]  Office  [phone]  CC: Primary care physician; Kirk Ruths, MD  Note: This dictation was prepared with Dragon dictation along with smaller phrase technology. Any transcriptional errors that result from this process are unintentional.

## 2019-05-04 NOTE — Progress Notes (Signed)
Spoke to Dr Anselm Jungling to check if CIWA orders can be d/c.  Per MD to d/c CIWA orders and to order Ativan 1mg  IV qx4hrs as needed for anxiety.

## 2019-05-05 DIAGNOSIS — Z23 Encounter for immunization: Secondary | ICD-10-CM | POA: Diagnosis not present

## 2019-05-05 MED ORDER — ADULT MULTIVITAMIN W/MINERALS CH: 1.0000 | ORAL_TABLET | Freq: Every day | ORAL | 0 refills | Status: AC

## 2019-05-05 MED ORDER — NICOTINE 21 MG/24HR TD PT24: 21.0000 mg | MEDICATED_PATCH | Freq: Every day | TRANSDERMAL | 0 refills | Status: AC

## 2019-05-05 MED ORDER — LORAZEPAM 1 MG PO TABS: 1.0000 mg | ORAL_TABLET | Freq: Two times a day (BID) | ORAL | 0 refills | Status: DC | PRN

## 2019-05-05 MED ORDER — AMLODIPINE BESYLATE 10 MG PO TABS: 10.0000 mg | ORAL_TABLET | Freq: Every day | ORAL | 0 refills | Status: AC

## 2019-05-05 MED ORDER — FOLIC ACID 1 MG PO TABS: 1.0000 mg | ORAL_TABLET | Freq: Every day | ORAL | 0 refills | Status: AC

## 2019-05-05 MED ORDER — THIAMINE HCL 250 MG PO TABS: 250.0000 mg | ORAL_TABLET | Freq: Every day | ORAL | 0 refills | Status: AC

## 2019-05-05 NOTE — Care Management Important Message (Signed)
Important Message  Patient Details  Name: Ian Newman. MRN: [id number] Date of Birth: [birth year]   Medicare Important Message Given:  Yes     [name] 05/05/2019, 11:06 AM

## 2019-05-05 NOTE — TOC Transition Note (Signed)
Transition of Care [facility]) - CM/SW Discharge Note   Patient Details  Name: Ian Newman. MRN: [id number] Date of Birth: [birth year]  Transition of Care [facility]) CM/SW Contact:  Candie Chroman, LCSW Phone Number: 05/05/2019, 12:01 PM   Clinical Narrative: Patient has orders to discharge home today. Per CSW handoff, patient's PCP had made referral for PT, SW to [facility]. [facility] is aware of discharge and is able to add RN and aide. Daughter is aware. Patient's wife will pick him up after work today. She gets off around 2:15 per daughter. No further concerns. CSW signing off.    Final next level of care: [place] Barriers to Discharge: Barriers Resolved   Patient Goals and CMS Choice Patient states their goals for this hospitalization and ongoing recovery are:: Patient not fully oriented.   Choice offered to / list presented to : NA  Discharge Placement                       Discharge Plan and Services     Post Acute Care Choice: Home Health                    HH Arranged: RN, PT, Nurse's Aide, Social Work [facility] Agency: [facility] Date [facility] Agency Contacted: 05/05/19   Representative spoke with at [place]: [place] ([place]) Interventions     Readmission Risk Interventions Readmission Risk Prevention Plan 05/05/2019  PCP or Specialist Appt within 5-7 Days Complete  Home Care Screening Complete  Some recent data might be hidden
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
## 2019-05-05 NOTE — Plan of Care (Signed)
Pt is d/ced home with home health, [facility].  Will have RN, PT, and aide.  Discussed with dr and daughter - Home for right now is probably where he will do the best given dementia.  Needs familiar surroundings.  Pt is ambulatory, just very restless. 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  IV removed.  Will review d/c instructions with daughter.  She will make f/u appts according to her schedule.  Wife will pick him up after she gets off work.

## 2019-05-09 DIAGNOSIS — J449 Chronic obstructive pulmonary disease, unspecified: Secondary | ICD-10-CM | POA: Diagnosis not present

## 2019-05-09 DIAGNOSIS — K5790 Diverticulosis of intestine, part unspecified, without perforation or abscess without bleeding: Secondary | ICD-10-CM | POA: Diagnosis not present

## 2019-05-09 DIAGNOSIS — F419 Anxiety disorder, unspecified: Secondary | ICD-10-CM | POA: Diagnosis not present

## 2019-05-09 DIAGNOSIS — E785 Hyperlipidemia, unspecified: Secondary | ICD-10-CM | POA: Diagnosis not present

## 2019-05-09 DIAGNOSIS — F0281 Dementia in other diseases classified elsewhere with behavioral disturbance: Secondary | ICD-10-CM | POA: Diagnosis not present

## 2019-05-09 DIAGNOSIS — I10 Essential (primary) hypertension: Secondary | ICD-10-CM | POA: Diagnosis not present

## 2019-05-09 DIAGNOSIS — F319 Bipolar disorder, unspecified: Secondary | ICD-10-CM | POA: Diagnosis not present

## 2019-05-09 DIAGNOSIS — K219 Gastro-esophageal reflux disease without esophagitis: Secondary | ICD-10-CM | POA: Diagnosis not present

## 2019-05-09 DIAGNOSIS — G3109 Other frontotemporal dementia: Secondary | ICD-10-CM | POA: Diagnosis not present

## 2019-05-12 DIAGNOSIS — G3109 Other frontotemporal dementia: Secondary | ICD-10-CM | POA: Diagnosis not present

## 2019-05-12 DIAGNOSIS — K5790 Diverticulosis of intestine, part unspecified, without perforation or abscess without bleeding: Secondary | ICD-10-CM | POA: Diagnosis not present

## 2019-05-12 DIAGNOSIS — F419 Anxiety disorder, unspecified: Secondary | ICD-10-CM | POA: Diagnosis not present

## 2019-05-12 DIAGNOSIS — J449 Chronic obstructive pulmonary disease, unspecified: Secondary | ICD-10-CM | POA: Diagnosis not present

## 2019-05-12 DIAGNOSIS — F0281 Dementia in other diseases classified elsewhere with behavioral disturbance: Secondary | ICD-10-CM | POA: Diagnosis not present

## 2019-05-12 DIAGNOSIS — K219 Gastro-esophageal reflux disease without esophagitis: Secondary | ICD-10-CM | POA: Diagnosis not present

## 2019-05-12 DIAGNOSIS — I10 Essential (primary) hypertension: Secondary | ICD-10-CM | POA: Diagnosis not present

## 2019-05-12 DIAGNOSIS — E785 Hyperlipidemia, unspecified: Secondary | ICD-10-CM | POA: Diagnosis not present

## 2019-05-12 DIAGNOSIS — F319 Bipolar disorder, unspecified: Secondary | ICD-10-CM | POA: Diagnosis not present

## 2019-05-13 ENCOUNTER — Other Ambulatory Visit: Payer: Self-pay | Admitting: Psychiatry

## 2019-05-13 DIAGNOSIS — I10 Essential (primary) hypertension: Secondary | ICD-10-CM | POA: Diagnosis not present

## 2019-05-13 DIAGNOSIS — E785 Hyperlipidemia, unspecified: Secondary | ICD-10-CM | POA: Diagnosis not present

## 2019-05-13 DIAGNOSIS — G3109 Other frontotemporal dementia: Secondary | ICD-10-CM | POA: Diagnosis not present

## 2019-05-13 DIAGNOSIS — Z96653 Presence of artificial knee joint, bilateral: Secondary | ICD-10-CM | POA: Diagnosis not present

## 2019-05-13 DIAGNOSIS — F419 Anxiety disorder, unspecified: Secondary | ICD-10-CM | POA: Diagnosis not present

## 2019-05-13 DIAGNOSIS — F319 Bipolar disorder, unspecified: Secondary | ICD-10-CM | POA: Diagnosis not present

## 2019-05-13 DIAGNOSIS — J449 Chronic obstructive pulmonary disease, unspecified: Secondary | ICD-10-CM | POA: Diagnosis not present

## 2019-05-13 DIAGNOSIS — Z87891 Personal history of nicotine dependence: Secondary | ICD-10-CM | POA: Diagnosis not present

## 2019-05-13 DIAGNOSIS — F0280 Dementia in other diseases classified elsewhere without behavioral disturbance: Secondary | ICD-10-CM | POA: Diagnosis not present

## 2019-05-13 DIAGNOSIS — K219 Gastro-esophageal reflux disease without esophagitis: Secondary | ICD-10-CM | POA: Diagnosis not present

## 2019-05-13 DIAGNOSIS — M25561 Pain in right knee: Secondary | ICD-10-CM | POA: Diagnosis not present

## 2019-05-13 DIAGNOSIS — F0281 Dementia in other diseases classified elsewhere with behavioral disturbance: Secondary | ICD-10-CM | POA: Diagnosis not present

## 2019-05-13 DIAGNOSIS — K5790 Diverticulosis of intestine, part unspecified, without perforation or abscess without bleeding: Secondary | ICD-10-CM | POA: Diagnosis not present

## 2019-05-14 DIAGNOSIS — E785 Hyperlipidemia, unspecified: Secondary | ICD-10-CM | POA: Diagnosis not present

## 2019-05-14 DIAGNOSIS — F0281 Dementia in other diseases classified elsewhere with behavioral disturbance: Secondary | ICD-10-CM | POA: Diagnosis not present

## 2019-05-14 DIAGNOSIS — J449 Chronic obstructive pulmonary disease, unspecified: Secondary | ICD-10-CM | POA: Diagnosis not present

## 2019-05-14 DIAGNOSIS — K5790 Diverticulosis of intestine, part unspecified, without perforation or abscess without bleeding: Secondary | ICD-10-CM | POA: Diagnosis not present

## 2019-05-14 DIAGNOSIS — F419 Anxiety disorder, unspecified: Secondary | ICD-10-CM | POA: Diagnosis not present

## 2019-05-14 DIAGNOSIS — F319 Bipolar disorder, unspecified: Secondary | ICD-10-CM | POA: Diagnosis not present

## 2019-05-14 DIAGNOSIS — K219 Gastro-esophageal reflux disease without esophagitis: Secondary | ICD-10-CM | POA: Diagnosis not present

## 2019-05-14 DIAGNOSIS — I10 Essential (primary) hypertension: Secondary | ICD-10-CM | POA: Diagnosis not present

## 2019-05-14 DIAGNOSIS — G3109 Other frontotemporal dementia: Secondary | ICD-10-CM | POA: Diagnosis not present

## 2019-05-16 DIAGNOSIS — I10 Essential (primary) hypertension: Secondary | ICD-10-CM | POA: Diagnosis not present

## 2019-05-16 DIAGNOSIS — E785 Hyperlipidemia, unspecified: Secondary | ICD-10-CM | POA: Diagnosis not present

## 2019-05-16 DIAGNOSIS — K5790 Diverticulosis of intestine, part unspecified, without perforation or abscess without bleeding: Secondary | ICD-10-CM | POA: Diagnosis not present

## 2019-05-16 DIAGNOSIS — J449 Chronic obstructive pulmonary disease, unspecified: Secondary | ICD-10-CM | POA: Diagnosis not present

## 2019-05-16 DIAGNOSIS — G3109 Other frontotemporal dementia: Secondary | ICD-10-CM | POA: Diagnosis not present

## 2019-05-16 DIAGNOSIS — F319 Bipolar disorder, unspecified: Secondary | ICD-10-CM | POA: Diagnosis not present

## 2019-05-16 DIAGNOSIS — F0281 Dementia in other diseases classified elsewhere with behavioral disturbance: Secondary | ICD-10-CM | POA: Diagnosis not present

## 2019-05-16 DIAGNOSIS — F419 Anxiety disorder, unspecified: Secondary | ICD-10-CM | POA: Diagnosis not present

## 2019-05-19 DIAGNOSIS — F319 Bipolar disorder, unspecified: Secondary | ICD-10-CM | POA: Diagnosis not present

## 2019-05-19 DIAGNOSIS — F0281 Dementia in other diseases classified elsewhere with behavioral disturbance: Secondary | ICD-10-CM | POA: Diagnosis not present

## 2019-05-19 DIAGNOSIS — I10 Essential (primary) hypertension: Secondary | ICD-10-CM | POA: Diagnosis not present

## 2019-05-19 DIAGNOSIS — E785 Hyperlipidemia, unspecified: Secondary | ICD-10-CM | POA: Diagnosis not present

## 2019-05-19 DIAGNOSIS — K219 Gastro-esophageal reflux disease without esophagitis: Secondary | ICD-10-CM | POA: Diagnosis not present

## 2019-05-19 DIAGNOSIS — F419 Anxiety disorder, unspecified: Secondary | ICD-10-CM | POA: Diagnosis not present

## 2019-05-19 DIAGNOSIS — K5790 Diverticulosis of intestine, part unspecified, without perforation or abscess without bleeding: Secondary | ICD-10-CM | POA: Diagnosis not present

## 2019-05-19 DIAGNOSIS — G3109 Other frontotemporal dementia: Secondary | ICD-10-CM | POA: Diagnosis not present

## 2019-05-19 DIAGNOSIS — J449 Chronic obstructive pulmonary disease, unspecified: Secondary | ICD-10-CM | POA: Diagnosis not present

## 2019-05-21 ENCOUNTER — Ambulatory Visit (INDEPENDENT_AMBULATORY_CARE_PROVIDER_SITE_OTHER): Payer: Medicare HMO | Admitting: Psychiatry

## 2019-05-21 ENCOUNTER — Encounter: Payer: Self-pay | Admitting: Psychiatry

## 2019-05-21 ENCOUNTER — Other Ambulatory Visit: Payer: Self-pay

## 2019-05-21 DIAGNOSIS — F02B18 Dementia in other diseases classified elsewhere, moderate, with other behavioral disturbance: Secondary | ICD-10-CM

## 2019-05-21 DIAGNOSIS — F0281 Dementia in other diseases classified elsewhere with behavioral disturbance: Secondary | ICD-10-CM

## 2019-05-21 DIAGNOSIS — F17200 Nicotine dependence, unspecified, uncomplicated: Secondary | ICD-10-CM

## 2019-05-21 DIAGNOSIS — F3161 Bipolar disorder, current episode mixed, mild: Secondary | ICD-10-CM | POA: Diagnosis not present

## 2019-05-21 DIAGNOSIS — F1221 Cannabis dependence, in remission: Secondary | ICD-10-CM | POA: Diagnosis not present

## 2019-05-21 DIAGNOSIS — F1021 Alcohol dependence, in remission: Secondary | ICD-10-CM

## 2019-05-21 MED ORDER — LORAZEPAM 1 MG PO TABS: 1.0000 mg | ORAL_TABLET | Freq: Every day | ORAL | 0 refills | Status: DC | PRN
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
## 2019-05-28 DIAGNOSIS — E785 Hyperlipidemia, unspecified: Secondary | ICD-10-CM | POA: Diagnosis not present

## 2019-05-28 DIAGNOSIS — F0281 Dementia in other diseases classified elsewhere with behavioral disturbance: Secondary | ICD-10-CM | POA: Diagnosis not present

## 2019-05-28 DIAGNOSIS — G3109 Other frontotemporal dementia: Secondary | ICD-10-CM | POA: Diagnosis not present

## 2019-05-28 DIAGNOSIS — J449 Chronic obstructive pulmonary disease, unspecified: Secondary | ICD-10-CM | POA: Diagnosis not present

## 2019-05-28 DIAGNOSIS — F319 Bipolar disorder, unspecified: Secondary | ICD-10-CM | POA: Diagnosis not present

## 2019-05-28 DIAGNOSIS — K5790 Diverticulosis of intestine, part unspecified, without perforation or abscess without bleeding: Secondary | ICD-10-CM | POA: Diagnosis not present

## 2019-05-28 DIAGNOSIS — K219 Gastro-esophageal reflux disease without esophagitis: Secondary | ICD-10-CM | POA: Diagnosis not present

## 2019-05-28 DIAGNOSIS — I10 Essential (primary) hypertension: Secondary | ICD-10-CM | POA: Diagnosis not present

## 2019-05-28 DIAGNOSIS — F419 Anxiety disorder, unspecified: Secondary | ICD-10-CM | POA: Diagnosis not present

## 2019-06-03 DIAGNOSIS — K219 Gastro-esophageal reflux disease without esophagitis: Secondary | ICD-10-CM | POA: Diagnosis not present

## 2019-06-03 DIAGNOSIS — F419 Anxiety disorder, unspecified: Secondary | ICD-10-CM | POA: Diagnosis not present

## 2019-06-03 DIAGNOSIS — I10 Essential (primary) hypertension: Secondary | ICD-10-CM | POA: Diagnosis not present

## 2019-06-03 DIAGNOSIS — G3109 Other frontotemporal dementia: Secondary | ICD-10-CM | POA: Diagnosis not present

## 2019-06-03 DIAGNOSIS — F319 Bipolar disorder, unspecified: Secondary | ICD-10-CM | POA: Diagnosis not present

## 2019-06-03 DIAGNOSIS — K5790 Diverticulosis of intestine, part unspecified, without perforation or abscess without bleeding: Secondary | ICD-10-CM | POA: Diagnosis not present

## 2019-06-03 DIAGNOSIS — J449 Chronic obstructive pulmonary disease, unspecified: Secondary | ICD-10-CM | POA: Diagnosis not present

## 2019-06-03 DIAGNOSIS — E785 Hyperlipidemia, unspecified: Secondary | ICD-10-CM | POA: Diagnosis not present

## 2019-06-03 DIAGNOSIS — F0281 Dementia in other diseases classified elsewhere with behavioral disturbance: Secondary | ICD-10-CM | POA: Diagnosis not present

## 2019-06-13 ENCOUNTER — Other Ambulatory Visit: Payer: Self-pay | Admitting: Psychiatry

## 2019-06-13 DIAGNOSIS — R451 Restlessness and agitation: Secondary | ICD-10-CM

## 2019-06-16 DIAGNOSIS — Z20828 Contact with and (suspected) exposure to other viral communicable diseases: Secondary | ICD-10-CM | POA: Diagnosis not present

## 2019-06-16 DIAGNOSIS — R05 Cough: Secondary | ICD-10-CM | POA: Diagnosis not present

## 2019-06-19 ENCOUNTER — Ambulatory Visit (INDEPENDENT_AMBULATORY_CARE_PROVIDER_SITE_OTHER): Payer: Medicare HMO | Admitting: Psychiatry

## 2019-06-19 ENCOUNTER — Encounter: Payer: Self-pay | Admitting: Psychiatry

## 2019-06-19 ENCOUNTER — Other Ambulatory Visit: Payer: Self-pay

## 2019-06-19 DIAGNOSIS — F1221 Cannabis dependence, in remission: Secondary | ICD-10-CM

## 2019-06-19 DIAGNOSIS — F3161 Bipolar disorder, current episode mixed, mild: Secondary | ICD-10-CM | POA: Insufficient documentation

## 2019-06-19 DIAGNOSIS — F17200 Nicotine dependence, unspecified, uncomplicated: Secondary | ICD-10-CM

## 2019-06-19 DIAGNOSIS — F0281 Dementia in other diseases classified elsewhere with behavioral disturbance: Secondary | ICD-10-CM | POA: Diagnosis not present

## 2019-06-19 DIAGNOSIS — F3178 Bipolar disorder, in full remission, most recent episode mixed: Secondary | ICD-10-CM

## 2019-06-19 DIAGNOSIS — F02B18 Dementia in other diseases classified elsewhere, moderate, with other behavioral disturbance: Secondary | ICD-10-CM

## 2019-06-19 DIAGNOSIS — F1021 Alcohol dependence, in remission: Secondary | ICD-10-CM | POA: Diagnosis not present

## 2019-06-19 MED ORDER — ESCITALOPRAM OXALATE 5 MG PO TABS: ORAL_TABLET | ORAL | 1 refills | Status: DC

## 2019-06-19 MED ORDER — OLANZAPINE 20 MG PO TABS: 20.0000 mg | ORAL_TABLET | Freq: Every day | ORAL | 1 refills | Status: DC
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
## 2019-06-20 ENCOUNTER — Emergency Department
Admission: EM | Admit: 2019-06-20 | Discharge: 2019-07-01 | Disposition: A | Discharge status: Home / Self Care | Payer: Medicare HMO | Attending: Emergency Medicine | Admitting: Emergency Medicine

## 2019-06-20 ENCOUNTER — Emergency Department: Payer: Medicare HMO

## 2019-06-20 ENCOUNTER — Other Ambulatory Visit: Payer: Self-pay

## 2019-06-20 DIAGNOSIS — I10 Essential (primary) hypertension: Secondary | ICD-10-CM | POA: Diagnosis not present

## 2019-06-20 DIAGNOSIS — F0280 Dementia in other diseases classified elsewhere without behavioral disturbance: Secondary | ICD-10-CM | POA: Diagnosis present

## 2019-06-20 DIAGNOSIS — Z96653 Presence of artificial knee joint, bilateral: Secondary | ICD-10-CM | POA: Insufficient documentation

## 2019-06-20 DIAGNOSIS — F17210 Nicotine dependence, cigarettes, uncomplicated: Secondary | ICD-10-CM | POA: Insufficient documentation

## 2019-06-20 DIAGNOSIS — R05 Cough: Secondary | ICD-10-CM | POA: Diagnosis not present

## 2019-06-20 DIAGNOSIS — U071 COVID-19: Secondary | ICD-10-CM | POA: Insufficient documentation

## 2019-06-20 DIAGNOSIS — F1027 Alcohol dependence with alcohol-induced persisting dementia: Secondary | ICD-10-CM | POA: Insufficient documentation

## 2019-06-20 DIAGNOSIS — M25561 Pain in right knee: Secondary | ICD-10-CM | POA: Diagnosis not present

## 2019-06-20 DIAGNOSIS — F319 Bipolar disorder, unspecified: Secondary | ICD-10-CM | POA: Diagnosis not present

## 2019-06-20 DIAGNOSIS — F0391 Unspecified dementia with behavioral disturbance: Secondary | ICD-10-CM | POA: Diagnosis not present

## 2019-06-20 DIAGNOSIS — Z046 Encounter for general psychiatric examination, requested by authority: Secondary | ICD-10-CM | POA: Diagnosis not present

## 2019-06-20 DIAGNOSIS — J449 Chronic obstructive pulmonary disease, unspecified: Secondary | ICD-10-CM | POA: Diagnosis not present

## 2019-06-20 DIAGNOSIS — Z79899 Other long term (current) drug therapy: Secondary | ICD-10-CM | POA: Insufficient documentation

## 2019-06-20 DIAGNOSIS — G3109 Other frontotemporal dementia: Secondary | ICD-10-CM | POA: Diagnosis present

## 2019-06-20 HISTORY — DX: Unspecified dementia, unspecified severity, without behavioral disturbance, psychotic disturbance, mood disturbance, and anxiety: F03.90

## 2019-06-20 LAB — COMPREHENSIVE METABOLIC PANEL
ALT: 22 U/L (ref 0–44)
AST: 37 U/L (ref 15–41)
Albumin: 4.6 g/dL (ref 3.5–5.0)
Alkaline Phosphatase: 63 U/L (ref 38–126)
Anion gap: 9 (ref 5–15)
BUN: 24 mg/dL — ABNORMAL HIGH (ref 6–20)
CO2: 27 mmol/L (ref 22–32)
Calcium: 8.8 mg/dL — ABNORMAL LOW (ref 8.9–10.3)
Chloride: 106 mmol/L (ref 98–111)
Creatinine, Ser: 1.11 mg/dL (ref 0.61–1.24)
GFR calc Af Amer: >60 mL/min (ref >60)
GFR calc non Af Amer: >60 mL/min (ref >60)
Glucose, Bld: 153 mg/dL — ABNORMAL HIGH (ref 70–99)
Potassium: 3.8 mmol/L (ref 3.5–5.1)
Sodium: 142 mmol/L (ref 135–145)
Total Bilirubin: 1.1 mg/dL (ref 0.3–1.2)
Total Protein: 7.6 g/dL (ref 6.5–8.1)

## 2019-06-20 LAB — CBC
HCT: 47.1 % (ref 39.0–52.0)
Hemoglobin: 15.4 g/dL (ref 13.0–17.0)
MCH: 30 pg (ref 26.0–34.0)
MCHC: 32.7 g/dL (ref 30.0–36.0)
MCV: 91.6 fL (ref 80.0–100.0)
Platelets: 207 10*3/uL (ref 150–400)
RBC: 5.14 MIL/uL (ref 4.22–5.81)
RDW: 12.6 % (ref 11.5–15.5)
WBC: 4.6 10*3/uL (ref 4.0–10.5)
nRBC: 0 % (ref 0.0–0.2)

## 2019-06-20 LAB — ETHANOL: Alcohol, Ethyl (B): <10 mg/dL (ref <10)

## 2019-06-20 LAB — SALICYLATE LEVEL: Salicylate Lvl: <7 mg/dL (ref 2.8–30.0)

## 2019-06-20 LAB — ACETAMINOPHEN LEVEL: Acetaminophen (Tylenol), Serum: <10 ug/mL — ABNORMAL LOW (ref 10–30)

## 2019-06-20 MED ORDER — FOLIC ACID 1 MG PO TABS
1.0000 mg | ORAL_TABLET | Freq: Every day | ORAL | Status: DC
  Administered 2019-06-21 – 2019-07-01 (×11): 1 mg via ORAL
  Filled 2019-06-20 (×12): qty 1

## 2019-06-20 MED ORDER — VITAMIN B-1 100 MG PO TABS
250.0000 mg | ORAL_TABLET | Freq: Every day | ORAL | Status: DC
  Administered 2019-06-21 – 2019-07-01 (×11): 250 mg via ORAL
  Filled 2019-06-20 (×11): qty 3

## 2019-06-20 MED ORDER — TRAZODONE HCL 50 MG PO TABS
50.0000 mg | ORAL_TABLET | Freq: Every day | ORAL | Status: DC
  Administered 2019-06-20 – 2019-06-30 (×9): 50 mg via ORAL
  Filled 2019-06-20 (×9): qty 1

## 2019-06-20 MED ORDER — LORAZEPAM 2 MG PO TABS
2.0000 mg | ORAL_TABLET | Freq: Once | ORAL | Status: AC
  Administered 2019-06-20: 2 mg via ORAL
  Filled 2019-06-20: qty 1

## 2019-06-20 MED ORDER — OLANZAPINE 5 MG PO TBDP
20.0000 mg | ORAL_TABLET | Freq: Every day | ORAL | Status: DC
  Administered 2019-06-20: 20 mg via ORAL
  Filled 2019-06-20: qty 4
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
## 2019-06-20 NOTE — ED Notes (Signed)
Writer has attempted to contact the pts spouse Thompson,Jo to gather collateral at [phone], no answer or voicemail system available   TTS has spoken with the pts son ATIF, ZAPPONE @ [phone] who shares that his mother has taken her night time medications and likely will not be available by phone until the A.M. He has shared that he pt has a history of Bipolar Disorder and that he has now been diagnosed with frontal lobe dementia. He state that the pt has declined quickly over the past eight months.

## 2019-06-20 NOTE — ED Notes (Addendum)
Pt up to his door opening it multiple times. Colletta [place], EDT redirecting patient and asking to keep door closed.  Patient appears agitated and punching wall.  MD is aware, see MAR for oral medications given.  Patient did take medications without problems and is cooperative to take them and sit on bed.  Will continue to monitor and assess patient's agitation.

## 2019-06-20 NOTE — ED Notes (Signed)
TTS has spoken with pts nurse in regards to coordinating pts Telepsych assessment. Pt has been recently medicated ( due to irritability)  and is currently resting. Clt will be assessed when alert.

## 2019-06-20 NOTE — ED Notes (Signed)
BEHAVIORAL HEALTH ROUNDING Patient sleeping: No. Patient alert : yes Behavior appropriate: Yes.  ; If no, describe:  Nutrition and fluids offered: yes Toileting and hygiene offered: Yes  Sitter present: q15 minute observations Law enforcement present: Yes  [employer]   ENVIRONMENTAL ASSESSMENT Potentially harmful objects out of patient reach: Yes.   Personal belongings secured: Yes.   Patient dressed in hospital provided attire only: Yes.   Plastic bags out of patient reach: Yes.   Patient care equipment (cords, cables, call bells, lines, and drains) shortened, removed, or accounted for: Yes.   Equipment and supplies removed from bottom of stretcher: Yes.   Potentially toxic materials out of patient reach: Yes.   Sharps container removed or out of patient reach: Yes.

## 2019-06-20 NOTE — Discharge Instructions (Addendum)
New medications we have prescribed:  - Risperdal 1 mg - take daily in the morning - Risperdal 2 mg - take nightly at bedtime - Trazodone 50 mg - take nightly at bedtime - Ativan 1 mg - as needed for agitation/anxiety/aggression. Only for use as needed. Not to be used more than 4 times in one day.   Please also obtain an over the counter multivitamin and take daily as directed on the box/bottle.   Please follow up with: - Your primary care doctor to review your ER visit and follow up on your symptoms.   Please return to the ER for any new or worsening symptoms.
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
## 2019-06-20 NOTE — ED Notes (Signed)
Pt dressed out into hospital attire: pt's belongings to include: 1 white shirt 2 socks 1 pair of shoes 1 pj pants 1 underwear 1 black jacket  Belongings given to wife and taken home

## 2019-06-21 DIAGNOSIS — G3109 Other frontotemporal dementia: Secondary | ICD-10-CM | POA: Diagnosis not present

## 2019-06-21 DIAGNOSIS — F0280 Dementia in other diseases classified elsewhere without behavioral disturbance: Secondary | ICD-10-CM

## 2019-06-21 MED ORDER — RISPERIDONE 1 MG PO TABS
1.0000 mg | ORAL_TABLET | Freq: Two times a day (BID) | ORAL | Status: DC
  Administered 2019-06-22: 1 mg via ORAL
  Filled 2019-06-21: qty 1

## 2019-06-21 MED ORDER — LORAZEPAM 2 MG PO TABS
2.0000 mg | ORAL_TABLET | Freq: Once | ORAL | Status: AC
  Administered 2019-06-21: 2 mg via ORAL
  Filled 2019-06-21: qty 1

## 2019-06-21 NOTE — ED Notes (Signed)
Pt. Became upset upon being told to return to room and to wear mask when leaving room.  Pt. Began pounding door with fists when pt. Closed door.  MD informed, orders issued.

## 2019-06-21 NOTE — TOC Initial Note (Signed)
Transition of Care (TOC) - Initial/Assessment Note    Patient Details  Name: Ian Newman. MRN: [id number] Date of Birth: 05-04-1961  Transition of Care [facility]) CM/SW Contact:    Katrina Stack, RN Phone Number: 06/21/2019, 5:40 PM  Clinical Narrative:                Patient sent to the ED due to aggressive behavior at home.  He has frontal lobe dementia which has progressed very quickly over the past 6 months per his daughter.  The patient's daughter  Ian Newman has been trying to get patient placed in a memory unit but patient's wife has declined.  Wife Ian Newman - not Kristen's mother,  did not want to give up the patient's disability check.  Medicaid was approved through December 2020 but wife would not allow patient to be placed.  Home health is in place through [facility]. He is positive for Covid since Monday 11/30. His wife was also positive.  CM discussed at length with wife Ian Newman that when patient's behaviors get under control he will likely discharge home if he is also medically stable. Ian Newman was hoping patient could be placed from the emergency room.  CM reinforced patient could not wait in the ED to be placed.  Has a Education officer, museum seeing patient from [facility].  CM discussed that the home health team can help place patient from home in a memory unit.  Discussed this [place] of [place]. CM discussed that patient's disability check would be needed to cover his facility placement in addition to his medicaid. Also discussed the need to consider and initiate guardian ship for patient if indeed he has been deemed incompetent and lacks capacity.  He is followed by Dr Ian Newman and psychiatry as outpatient.[facility] needs to be notified when discharges home and request home visit same day or at least within 24 hours. Discussed with team that patient most likely will also need a tb screening. Created FL2 and sent to memory care units in [place] and a few in [place]     Patient Goals and CMS  Choice        Expected Discharge Plan and Services                                                Prior Living Arrangements/Services                       Activities of Daily Living   ADL Screening (condition at time of admission) Patient's cognitive ability adequate to safely complete daily activities?: Yes Patient able to express need for assistance with ADLs?: Yes Independently performs ADLs?: Yes (appropriate for developmental age)  Permission Sought/Granted                  Emotional Assessment              Admission diagnosis:  VC Patient Active Problem List   Diagnosis Date Noted  . Bipolar disorder, current episode mixed, mild ([place]) 06/19/2019  . Confusion 05/02/2019  . 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 Agitation 03/04/2019  . Hypotension 02/22/2019  . AKI (acute kidney injury) ([place]) 02/22/2019  . Alcohol use disorder, moderate, in early remission ([place]) 01/21/2019  . Cannabis use  disorder, moderate, in early remission ([place]) 01/21/2019  . Cognitive disorder 01/21/2019  . Insomnia due to mental condition 01/21/2019  . Frontal lobe dementia ([place]) 12/31/2018  . Seizure-like activity ([place]) 12/10/2018  . Cognitive change 11/11/2018  . Noncompliance 09/18/2018  . Bipolar I disorder ([place]) 09/15/2018  . Adjustment disorder 07/15/2018  . Tobacco use disorder 07/15/2018  . S/P TKR (total knee replacement) using cement, right 08/09/2017  . Musculoskeletal back pain 10/27/2016  . Sleep disturbance 06/05/2016  . Status post total knee replacement using cement 04/06/2016  . Hyperlipidemia 02/18/2016  . Essential hypertension 02/18/2016  . Gastroesophageal reflux disease with esophagitis 12/07/2014   PCP:  Ian Ruths, MD Pharmacy:   [place], [place] [address] [place] [address] Phone: [phone] Fax:  [phone]     Social Determinants of Health (SDOH) Interventions    Readmission Risk Interventions Readmission Risk Prevention Plan 05/05/2019  PCP or Specialist Appt within 5-7 Days Complete  Home Care Screening Complete  Some recent data might be hidden

## 2019-06-21 NOTE — ED Notes (Signed)
Pts wife brought copy of POA papers. Papers are on pts hard chart.

## 2019-06-21 NOTE — ED Notes (Signed)
Pt. Laying in bed watching tv.  Introduced myself to patient.  Pt. Requested and was given additional blanket.  When asked "How are you feeling?"  Pt. States "I feel ok"

## 2019-06-21 NOTE — ED Notes (Signed)
Pt coming out of room asking to see a doctor. Pt informed that he doctor had already spoken with him. When asked why he needed to see the doctor pt becomes frustrated and started hitting the wall and door. Pt is not aggressive towards staff. Pt is easily redirected back into his room.

## 2019-06-21 NOTE — ED Notes (Signed)
Pt. Requested and was given meal tray.

## 2019-06-21 NOTE — ED Notes (Signed)
Pts wife called, Pt wife is pts POA, she is going to bring copy of POA papers to [place]. Pt wife states that it is OK to speak with pts daughter, Lilia Argue
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
## 2019-06-21 NOTE — ED Notes (Signed)
Pt came out of the room, pt requesting to see MD because he feels sweaty. Pts temp taken by Thedore Mins, EDT. Temp WNL. Dr. Jacqualine Code was made aware that pt is requesting to speak to the MD.

## 2019-06-21 NOTE — ED Notes (Signed)
Pt given meal tray.

## 2019-06-21 NOTE — ED Notes (Signed)
Pt ambulated to restroom. 
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
## 2019-06-21 NOTE — ED Notes (Signed)
TTS and psych doing tele-visit with pt currently.
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
## 2019-06-21 NOTE — Consult Note (Signed)
Pt sedated and unable to assess.  TTS attempted to get collateral, but no one answered the phone.  Will try again in the am.

## 2019-06-21 NOTE — BH Assessment (Signed)
Patient's daughter contact Earnstine Regal [phone])

## 2019-06-22 LAB — URINE DRUG SCREEN, QUALITATIVE (ARMC ONLY)
Amphetamines, Ur Screen: NOT DETECTED
Barbiturates, Ur Screen: NOT DETECTED
Benzodiazepine, Ur Scrn: POSITIVE — AB
Cannabinoid 50 Ng, Ur ~~LOC~~: POSITIVE — AB
Cocaine Metabolite,Ur ~~LOC~~: NOT DETECTED
MDMA (Ecstasy)Ur Screen: NOT DETECTED
Methadone Scn, Ur: NOT DETECTED
Opiate, Ur Screen: NOT DETECTED
Phencyclidine (PCP) Ur S: NOT DETECTED
Tricyclic, Ur Screen: NOT DETECTED

## 2019-06-22 MED ORDER — RISPERIDONE 1 MG PO TABS
0.5000 mg | ORAL_TABLET | Freq: Once | ORAL | Status: AC
  Administered 2019-06-22: 0.5 mg via ORAL
  Filled 2019-06-22: qty 1

## 2019-06-22 MED ORDER — LORAZEPAM 1 MG PO TABS
1.0000 mg | ORAL_TABLET | Freq: Four times a day (QID) | ORAL | Status: DC | PRN
  Administered 2019-06-22 – 2019-07-01 (×13): 1 mg via ORAL
  Filled 2019-06-22 (×13): qty 1

## 2019-06-22 MED ORDER — RISPERIDONE 1 MG PO TABS: 0.5000 mg | ORAL_TABLET | Freq: Once | ORAL | Status: DC

## 2019-06-22 MED ORDER — RISPERIDONE 1 MG PO TABS
1.0000 mg | ORAL_TABLET | Freq: Every day | ORAL | Status: DC
  Administered 2019-06-23 – 2019-07-01 (×9): 1 mg via ORAL
  Filled 2019-06-22 (×9): qty 1

## 2019-06-22 MED ORDER — RISPERIDONE 1 MG PO TABS
2.0000 mg | ORAL_TABLET | Freq: Every day | ORAL | Status: DC
  Administered 2019-06-23 – 2019-06-30 (×8): 2 mg via ORAL
  Filled 2019-06-22 (×8): qty 2

## 2019-06-22 MED ORDER — GUAIFENESIN ER 600 MG PO TB12
600.0000 mg | ORAL_TABLET | Freq: Two times a day (BID) | ORAL | Status: DC | PRN
  Administered 2019-06-22 – 2019-07-01 (×5): 600 mg via ORAL
  Filled 2019-06-22 (×5): qty 1

## 2019-06-22 NOTE — ED Provider Notes (Signed)
-----------------------------------------   3:21 AM on 06/22/2019 -----------------------------------------   Blood pressure (!) 137/93, pulse 98, temperature 98.4 F (36.9 C), temperature source Oral, resp. rate 18, height 1.829 m (6'), weight 105.2 kg, SpO2 100 %.  The patient is calm and cooperative at this time.  There have been no acute events since the last update.  Awaiting disposition plan from Behavioral Medicine and/or Social Work team(s).   Hinda Kehr, MD 06/22/19 [phone]

## 2019-06-22 NOTE — ED Notes (Signed)
Pt given meal tray.

## 2019-06-22 NOTE — ED Notes (Signed)
Pt opening door to room. Redirected to stay in room and keep door closed. Pt went back in room and shut door.

## 2019-06-22 NOTE — ED Notes (Signed)
Pt now awake again coming out of room multiple times and banging on door and walls when staff tells him to shut the door. Pt yelling in his room. Not aggressive towards staff but remains abusive to self. NP messaged.
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
## 2019-06-22 NOTE — Consult Note (Signed)
I was contacted by RN in the ED stating that patient has continued to be agitated and hitting on the walls.  Patient did receive a one-time dose of 0.5 mg of Risperdal and has also received 1 mg of Ativan.  Due to the patient's behavior and his agitation will increase the nightly dose of Risperdal to 2 mg and continue the 1 mg dose every morning.

## 2019-06-22 NOTE — ED Notes (Signed)
Pt awake, ate breakfast. Ambulatory in room. No acute events.

## 2019-06-22 NOTE — ED Notes (Signed)
Pt beating door after being told to shut the door. Pt stops after a couple of times.

## 2019-06-22 NOTE — ED Notes (Signed)
Attempted to call wife per pt request x2, unable to leave VM. Will notify patient.

## 2019-06-22 NOTE — ED Notes (Addendum)
Pt given water and pt's bedside commode emptied.

## 2019-06-22 NOTE — ED Notes (Signed)
Pt continues to try to open the door and then when told to close it he begins beating on the door. NP messaged about it.

## 2019-06-22 NOTE — ED Notes (Signed)
Pt given magazines to look through.

## 2019-06-22 NOTE — ED Notes (Signed)
Meal tray given 

## 2019-06-22 NOTE — ED Notes (Signed)
Meal tray placed at bedside.

## 2019-06-23 ENCOUNTER — Telehealth: Payer: Self-pay

## 2019-06-23 MED ORDER — OLANZAPINE 5 MG PO TBDP
5.0000 mg | ORAL_TABLET | Freq: Once | ORAL | Status: AC
  Administered 2019-06-23: 5 mg via ORAL
  Filled 2019-06-23: qty 1

## 2019-06-23 NOTE — ED Provider Notes (Signed)
Patient increasingly agitated. Pt has received ativan and risperdal today for this. Will give single dose of zyprexa (had been on this previously) in addition to his increased dose of risperdal tonight.   Duffy Bruce, MD 06/23/19 [phone]

## 2019-06-23 NOTE — Telephone Encounter (Signed)
pt wife called states that pt was out of control and that he was sent to ER. pt was acting out, anger issues

## 2019-06-23 NOTE — ED Notes (Signed)
Hourly rounding reveals patient sleeping in room. No complaints, stable, in no acute distress. Q15 minute rounds and monitoring via Security to continue. 

## 2019-06-23 NOTE — Telephone Encounter (Signed)
OK - thanks for letting me know

## 2019-06-23 NOTE — ED Notes (Signed)
Patient refused to stay in room

## 2019-06-23 NOTE — ED Provider Notes (Signed)
-----------------------------------------   4:16 AM on 06/23/2019 -----------------------------------------   Blood pressure 128/78, pulse (!) 102, temperature 97.8 F (36.6 C), temperature source Oral, resp. rate 16, height 1.829 m (6'), weight 105.2 kg, SpO2 98 %.  The patient is calm and cooperative at this time.  There have been no acute events since the last update.  Awaiting disposition plan from Behavioral Medicine and/or Social Work team(s). Of note, the patient tested negative for COVID-19.   Hinda Kehr, MD 06/23/19 [phone]

## 2019-06-23 NOTE — ED Notes (Signed)
Patient came outside of room with no mask and refused to go back in room, patient then turned around

## 2019-06-23 NOTE — ED Notes (Signed)
Patient standing in doorway with door open at this time

## 2019-06-23 NOTE — ED Notes (Signed)
Patient refused to keep door shut explained to patient why he needs to keep door shut.

## 2019-06-23 NOTE — ED Notes (Signed)
Patient is constantly coming out of room, and is not redirectable, he walked to the trash can and put his plate in the trash after staff asked him to close his door. Patient then stood at the door and staff asked him again to close the door, patient attempted to slam the door and then began banging on the walls.

## 2019-06-23 NOTE — ED Notes (Signed)
Patient tbecoming frustrated when asked to keep door closed due to him being COVID+. Patient began banging on the walls and saying "fuck the door" continuously. Patient is very restless.  Patient given Ativan 1 mg PO

## 2019-06-23 NOTE — Care Management (Signed)
ED RN CM: Attempted to contact spouse, [name] @ [phone] to review discharge plan of returning home with home health vs going to memory care unit. No answer and no option to leave voicemail. Will try again later.

## 2019-06-23 NOTE — ED Notes (Signed)
Pt reports pain in his knee. Will ask MD for pain medicine.

## 2019-06-23 NOTE — ED Notes (Signed)
Patient continues not to stay in his room, even with staff redirection. Patient says he is mad because he has to stay in his room

## 2019-06-24 NOTE — ED Notes (Signed)
BEHAVIORAL HEALTH ROUNDING Patient sleeping: No. Patient alert : yes Behavior appropriate: Yes.  ; If no, describe:  Nutrition and fluids offered: yes Toileting and hygiene offered: Yes  Sitter present: q15 minute observations  Law enforcement present: Yes  [employer]

## 2019-06-24 NOTE — ED Notes (Signed)
Pt given breakfast tray

## 2019-06-24 NOTE — ED Notes (Signed)
BEHAVIORAL HEALTH ROUNDING Patient sleeping: No. Patient alert and oriented: yes Behavior appropriate: Yes.  ; If no, describe:  Nutrition and fluids offered: yes Toileting and hygiene offered: Yes  Sitter present: q15 minute observations  Law enforcement present: Yes [employer]  

## 2019-06-24 NOTE — ED Provider Notes (Signed)
-----------------------------------------   6:03 AM on 06/24/2019 -----------------------------------------   Blood pressure (!) 154/106, pulse (!) 101, temperature 97.8 F (36.6 C), temperature source Oral, resp. rate 18, height 6' (1.829 m), weight 105.2 kg, SpO2 99 %.  The patient is sleeping at this time.  There have been no acute events since the last update.  Awaiting disposition plan from Behavioral Medicine and/or Social Work team(s).   Paulette Blanch, MD 06/24/19 [phone]

## 2019-06-24 NOTE — ED Notes (Signed)
He has been nonstop at the door for the last half hour  - he is verbalizing  "my wife is on the way to pick me up - I don't want to stay in this room anymore  - my wife is coming to take me home"  Pt reassured

## 2019-06-24 NOTE — ED Notes (Signed)
He is awaiting placement in a memory care unit  - psych reports that he does not meet inpt criteria for admission  Pt remains IVC ?  Social work consult is in progress
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
## 2019-06-24 NOTE — ED Notes (Signed)
BEHAVIORAL HEALTH ROUNDING Patient sleeping: Yes.   Patient alert and oriented: eyes closed  Appears asleep Behavior appropriate: Yes.  ; If no, describe:  Nutrition and fluids offered: Yes  Toileting and hygiene offered: sleeping Sitter present: q 15 minute observations  Lawenforcement present: yes  [employer]  ENVIRONMENTAL ASSESSMENT Potentially harmful objects out of patient reach: Yes.   Personal belongings secured: Yes.   Patient dressed in hospital provided attire only: Yes.   Plastic bags out of patient reach: Yes.   Patient care equipment (cords, cables, call bells, lines, and drains) shortened, removed, or accounted for: Yes.   Equipment and supplies removed from bottom of stretcher: Yes.   Potentially toxic materials out of patient reach: Yes.   Sharps container removed or out of patient reach: Yes.

## 2019-06-24 NOTE — ED Notes (Addendum)
BEHAVIORAL HEALTH ROUNDING Patient sleeping: No. Patient alert : yes Behavior appropriate: Yes  He has dementia and he asked the same questions due to not remembering the answer .  ; If no, describe:  Nutrition and fluids offered: yes Toileting and hygiene offered: Yes  Sitter present: q15 minute observations  Law enforcement present: Yes  [employer]

## 2019-06-24 NOTE — ED Notes (Signed)
Patient observed lying in bed with eyes closed  Even, unlabored respirations observed   NAD pt appears to be sleeping  I will continue to monitor along with every 15 minute visual observations     
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
## 2019-06-24 NOTE — ED Notes (Signed)
BEHAVIORAL HEALTH ROUNDING Patient sleeping: No. Patient alert yes Behavior appropriate: Yes.  ; If no, describe:  Nutrition and fluids offered: yes Toileting and hygiene offered: Yes  Sitter present: q15 minute observations Law enforcement present: Yes  [employer]

## 2019-06-24 NOTE — ED Notes (Signed)
He has spoken with his wife on the phone  - he is walking around smiling stating  "my wife misses me and she loves me - I am ready to go home"   Pt reassured of his safety here and encouraged to keep his mask on and stay in his room

## 2019-06-24 NOTE — ED Notes (Signed)
Gave pt a food tray with juice.

## 2019-06-24 NOTE — Telephone Encounter (Signed)
He may need placement ,looks like.

## 2019-06-24 NOTE — ED Notes (Signed)
Room cleaned  - BSC emptied  Clothing changed   Linens appeared to be clean  Blankets straightened

## 2019-06-25 MED ORDER — LORAZEPAM 2 MG PO TABS
2.0000 mg | ORAL_TABLET | Freq: Once | ORAL | Status: AC
  Administered 2019-06-25: 2 mg via ORAL
  Filled 2019-06-25: qty 1

## 2019-06-25 NOTE — ED Notes (Signed)
Patient asked for a drink ,drink was given

## 2019-06-25 NOTE — ED Notes (Signed)
Writer assisted patient in cleaning himself off, patient urinated on himself, Probation officer and patient also changed his linen
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
## 2019-06-25 NOTE — ED Notes (Signed)
Hourly rounding reveals patient sleeping in room. No complaints, stable, in no acute distress. Q15 minute rounds and monitoring via Security to continue. 

## 2019-06-25 NOTE — ED Notes (Signed)
Hourly rounding reveals patient in room. No complaints, stable, in no acute distress. Q15 minute rounds and monitoring via Rover and Officer to continue.   
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
## 2019-06-25 NOTE — ED Notes (Signed)
Report to include Situation, Background, Assessment, and Recommendations received from [facility]. Patient alert and disoriented X 4, warm and dry, in no acute distress.UTA SI, HI, AVH and pain  Due to advanced dementia. Patient made aware of Q15 minute rounds and Engineer, drilling presence for their safety. Patient instructed to come to me with needs or concerns.

## 2019-06-25 NOTE — ED Provider Notes (Signed)
-----------------------------------------   2:21 AM on 06/25/2019 -----------------------------------------   Blood pressure (!) 144/94, pulse 90, temperature 98 F (36.7 C), temperature source Oral, resp. rate 18, height 6' (1.829 m), weight 105.2 kg, SpO2 99 %.  The patient had no acute events since last update.  Calm and cooperative at this time.  Disposition is pending per Psychiatry/Behavioral Medicine team recommendations.     Alfred Levins, [place], MD 06/25/19 [phone]
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
## 2019-06-26 NOTE — ED Notes (Signed)
BEHAVIORAL HEALTH ROUNDING Patient sleeping: No. Patient alert : yes Behavior appropriate: Yes.  ; If no, describe:  Nutrition and fluids offered: yes Toileting and hygiene offered: Yes  Sitter present: q15 minute observations Law enforcement present: Yes   [employer]

## 2019-06-26 NOTE — ED Notes (Signed)
Hourly rounding reveals patient in room. No complaints, stable, in no acute distress. Q15 minute rounds and monitoring via Rover and Officer to continue.   

## 2019-06-26 NOTE — ED Notes (Signed)
BEHAVIORAL HEALTH ROUNDING Patient sleeping: No. Patient alert : yes Behavior appropriate: Yes.  ; If no, describe:  Nutrition and fluids offered: yes Toileting and hygiene offered: Yes  Sitter present: q15 minute observations Law enforcement present: Yes  [employer]

## 2019-06-26 NOTE — ED Notes (Signed)
He has been into the hallway repeatedly this hour  - he does not understand that he cannot just walk around  He also does not understand to keep his mask on   He keeps stating  "My wife is coming to get me"

## 2019-06-26 NOTE — ED Notes (Addendum)
BEHAVIORAL HEALTH ROUNDING Patient sleeping: No. Patient alert: yes Behavior appropriate: Yes.  ; If no, describe:  Nutrition and fluids offered: yes Toileting and hygiene offered: Yes  Sitter present: q15 minute observations  Law enforcement present: Yes  [employer]

## 2019-06-26 NOTE — ED Notes (Signed)
He has laid back down and he is resting with his eyes closed

## 2019-06-26 NOTE — Social Work (Signed)
CSW was contacted by Raymond Gurney, APS Worker with [facility] [phone]). She stated that she will be the social worker on the case since they are going to initiate the APS report. Roni stated that a Education officer, museum will be out on 12/11 to complete the assessment/meet with the patient. Roni stated that she will contact one of the nurses to see how to make that happen since he is COVID+/on precautions.      Ardelle Anton, MSW, [facility] ([place]) Phone: [phone] Fax: [phone]

## 2019-06-26 NOTE — ED Notes (Signed)
Patient observed lying in bed with eyes closed  Even, unlabored respirations observed   NAD pt appears to be sleeping  I will continue to monitor along with every 15 minute visual observations     

## 2019-06-26 NOTE — ED Notes (Signed)
Pt given breakfast meal tray. 

## 2019-06-26 NOTE — ED Notes (Addendum)
He has awakened  - clothing and linen saturated with urine  Assisted with a bedside bath and clothing change   - linen changed and bed cleaned with purple wipes  Floor in room mopped  BSC disinfected   Room was in disaray

## 2019-06-26 NOTE — Social Work (Signed)
CSW spoke with pt's wife who stated that she can come get the patient but that she does want placement. Pt's wife stated that APS called her and she does not want to get in trouble. Pt's wife asked that he be placed before going home with her.  CSW spoke with APS worker who stated that the patient's wife did state that she can come get him. APS stated that the report was put in and they going to still initiate it but the hospital can look for placement for the patient.  CSW spoke with the pt's nurse about letting the patient stay one more night so that CSW can look for placement in the morning. APS worker and pt's wife both agreed. Pt's nurse asked for a note to be put in and made CSW aware that the patient's IVC was dropped.      Ardelle Anton, MSW, [facility] ([place]) Phone: [phone] Fax: [phone]

## 2019-06-26 NOTE — ED Notes (Addendum)
BEHAVIORAL HEALTH ROUNDING Patient sleeping: No. Patient alert    Yes  Behavior appropriate: Yes.  ; If no, describe:  Nutrition and fluids offered: yes Toileting and hygiene offered: Yes  Sitter present: q15 minute observations  Law enforcement present: Yes  [employer]

## 2019-06-26 NOTE — ED Notes (Signed)
BEHAVIORAL HEALTH ROUNDING Patient sleeping: No. Patient alert and oriented: yes Behavior appropriate: Yes.  ; If no, describe:  Nutrition and fluids offered: yes Toileting and hygiene offered: Yes  Sitter present: q15 minute observations  Law enforcement present: Yes [employer]  

## 2019-06-26 NOTE — ED Notes (Signed)
BEHAVIORAL HEALTH ROUNDING Patient sleeping: Yes.   Patient alert and oriented: eyes closed  Appears asleep Behavior appropriate: Yes.  ; If no, describe:  Nutrition and fluids offered: Yes  Toileting and hygiene offered: sleeping Sitter present: q 15 minute observations Law enforcement present: yes  [employer]  ENVIRONMENTAL ASSESSMENT Potentially harmful objects out of patient reach: Yes.   Personal belongings secured: Yes.   Patient dressed in hospital provided attire only: Yes.   Plastic bags out of patient reach: Yes.   Patient care equipment (cords, cables, call bells, lines, and drains) shortened, removed, or accounted for: Yes.   Equipment and supplies removed from bottom of stretcher: Yes.   Potentially toxic materials out of patient reach: Yes.   Sharps container removed or out of patient reach: Yes.

## 2019-06-26 NOTE — ED Notes (Signed)
ED  Is the patient under IVC or is there intent for IVC: Yes.   Is the patient medically cleared: Yes.   Is there vacancy in the ED BHU: Yes.   Is the population mix appropriate for patient: Yes.   Is the patient awaiting placement in inpatient or outpatient setting: Yes. Awaiting placement   Has the patient had a psychiatric consult: Yes.   Survey of unit performed for contraband, proper placement and condition of furniture, tampering with fixtures in bathroom, shower, and each patient room: Yes.  ; Findings:  APPEARANCE/BEHAVIOR Calm and cooperative NEURO ASSESSMENT Orientation: oriented x self  Denies pain Hallucinations: No.None noted  Speech: Normal Gait: normal RESPIRATORY ASSESSMENT Even  Unlabored respirations  CARDIOVASCULAR ASSESSMENT Pulses equal   regular rate  Skin warm and dry   GASTROINTESTINAL ASSESSMENT no GI complaint EXTREMITIES Full ROM  PLAN OF CARE Provide calm/safe environment. Vital signs assessed twice daily. ED BHU Assessment once each 12-hour shift.  Assure the ED provider has rounded once each shift. Provide and encourage hygiene. Provide redirection as needed. Assess for escalating behavior; address immediately and inform ED provider.  Assess family dynamic and appropriateness for visitation as needed: Yes.  ; If necessary, describe findings:  Educate the patient/family about BHU procedures/visitation: Yes.  ; If necessary, describe findings:

## 2019-06-26 NOTE — ED Provider Notes (Signed)
-----------------------------------------   6:32 AM on 06/26/2019 -----------------------------------------   Blood pressure 118/76, pulse 94, temperature 98.4 F (36.9 C), temperature source Oral, resp. rate 18, height 6' (1.829 m), weight 105.2 kg, SpO2 99 %.  The patient had no acute events since last update.  Calm and cooperative at this time.  Disposition is pending per Psychiatry/Behavioral Medicine team recommendations.     Rudene Re, MD 06/26/19 [phone]

## 2019-06-26 NOTE — ED Notes (Signed)
Report to include Situation, Background, Assessment, and Recommendations received from Amy RN. Patient alert and disoriented X4, warm and dry, in no acute distress.UTA SI, HI, AVH and pain due to advanced dememtia. Patient made aware of Q15 minute rounds and Engineer, drilling presence for their safety. Patient instructed to come to me with needs or concerns.

## 2019-06-27 NOTE — ED Notes (Signed)
Hourly rounding reveals patient in room. No complaints, stable, in no acute distress. Q15 minute rounds and monitoring via Rover and Officer to continue.   

## 2019-06-27 NOTE — Social Work (Addendum)
CSW contacted the following agenices regarding placement:   - [place] with admissions will not be back into the office until Monday) : Pending  - [facility] ([place] spoke with [facility]. There may be a problem since he is under the age of 16, but they can ask for special clerance. They will not have beds until at least Monday. They are reviewing his case)  -[facility] ([place] spoke with [place]. They are on hold from accepting anyone due to [place])  - [place] [facility] (left a voicemail for Urban Gibson [phone])  - [place] (CSW talked with Merleen Nicely. Merleen Nicely wanted CSW to refax the referral. CSW refaxed the referral to [phone]).    CSW contacted pt's wife, Ian Newman, to update that referrals were sent out.  CSW updated APS, as well.    CSW asked doctor to order a new [place], MSW, [facility] ([place]) Phone: [phone] Fax: [phone]

## 2019-06-27 NOTE — ED Notes (Signed)
Patient observed lying in bed with eyes closed  Even, unlabored respirations observed   NAD pt appears to be sleeping  I will continue to monitor along with every 15 minute visual observations     

## 2019-06-27 NOTE — Social Work (Signed)
CSW resent out SNF referrals. CSW will continue to search for placement for the patient.     Ardelle Anton, MSW, [facility] ([place]) Phone: [phone] Fax: [phone]

## 2019-06-27 NOTE — ED Notes (Signed)
BEHAVIORAL HEALTH ROUNDING Patient sleeping: No. Patient alert : yes Behavior appropriate: Yes.  ; If no, describe:  Nutrition and fluids offered: yes Toileting and hygiene offered: Yes  Sitter present: q15 minute observations  Law enforcement present: Yes  [employer]

## 2019-06-27 NOTE — ED Notes (Signed)
ED  Is the patient under IVC or is there intent for IVC: no voluntary  IVC rescinded due to social work reporting pending discharge   Is the patient medically cleared: Yes.   Is there vacancy in the ED BHU: Yes.   Is the population mix appropriate for patient:  No  He has dementia and is Covid positive  Is the patient awaiting placement in inpatient or outpatient setting:   Has the patient had a psychiatric consult: Yes.   Survey of unit performed for contraband, proper placement and condition of furniture, tampering with fixtures in bathroom, shower, and each patient room: Yes.  ; Findings:  APPEARANCE/BEHAVIOR Calm and cooperative NEURO ASSESSMENT Orientation: oriented to self  Hallucinations:  None observed  Speech: Normal  Slow to respond at times  Gait: normal RESPIRATORY ASSESSMENT Even  Unlabored respirations  CARDIOVASCULAR ASSESSMENT Pulses equal   regular rate  Skin warm and dry   GASTROINTESTINAL ASSESSMENT no GI complaint EXTREMITIES Full ROM  PLAN OF CARE Provide calm/safe environment. Vital signs assessed twice daily. ED BHU Assessment once each 12-hour shift. Collaborate with TTS daily or as condition indicates. Assure the ED provider has rounded once each shift. Provide and encourage hygiene. Provide redirection as needed. Assess for escalating behavior; address immediately and inform ED provider.  Assess family dynamic and appropriateness for visitation as needed: Yes.  ; If necessary, describe findings:  Educate the patient/family about BHU procedures/visitation: Yes.  ; If necessary, describe findings:

## 2019-06-27 NOTE — ED Provider Notes (Signed)
-----------------------------------------   4:30 AM on 06/27/2019 -----------------------------------------   Blood pressure (!) 139/92, pulse 88, temperature 97.8 F (36.6 C), temperature source Oral, resp. rate 17, height 1.829 m (6'), weight 105.2 kg, SpO2 97 %.  The patient is calm and cooperative at this time.  IVC has been rescinded and the patient is now voluntary and awaiting placement from social work team.   Hinda Kehr, MD 06/27/19 0430

## 2019-06-27 NOTE — ED Notes (Signed)
He has been in and out the room today   Pt stating  "I am ready to go home  - my wife is coming to get me"  Pt reassured that social work is finding him a safe place to live   [employer] and drink provided   TV station adjusted

## 2019-06-27 NOTE — ED Notes (Signed)
BEHAVIORAL HEALTH ROUNDING Patient sleeping: No. Patient alert   Yes  Behavior appropriate: Yes.  ; If no, describe:  Nutrition and fluids offered: yes Toileting and hygiene offered: Yes  Sitter present: q15 minute observations Law enforcement present: Yes  [employer]

## 2019-06-27 NOTE — ED Notes (Addendum)
BEHAVIORAL HEALTH ROUNDING Patient sleeping: Yes.   Patient alert and oriented: eyes closed  Appears asleep Behavior appropriate: Yes.  ; If no, describe:  Nutrition and fluids offered: Yes  Toileting and hygiene offered: sleeping Sitter present: q 15 minute observations Law enforcement present: yes  [employer]  ENVIRONMENTAL ASSESSMENT Potentially harmful objects out of patient reach: Yes.   Personal belongings secured: Yes.   Patient dressed in hospital provided attire only: Yes.   Plastic bags out of patient reach: Yes.   Patient care equipment (cords, cables, call bells, lines, and drains) shortened, removed, or accounted for: Yes.   Equipment and supplies removed from bottom of stretcher: Yes.   Potentially toxic materials out of patient reach: Yes.   Sharps container removed or out of patient reach: Yes.

## 2019-06-27 NOTE — ED Notes (Signed)
+  Covid/ VOL/ Pending Placement

## 2019-06-27 NOTE — ED Notes (Signed)
Pt. Currently sleeping in 21A bed.

## 2019-06-27 NOTE — ED Notes (Addendum)
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

## 2019-06-27 NOTE — ED Notes (Signed)
Pt incontinent of urine. Bed linen changed and pt clothing changed. Pt given a cup of water.

## 2019-06-27 NOTE — ED Notes (Signed)
BEHAVIORAL HEALTH ROUNDING Patient sleeping: Yes.   Patient alert and oriented: eyes closed  Appears asleep Behavior appropriate: Yes.  ; If no, describe:  Nutrition and fluids offered: Yes  Toileting and hygiene offered: sleeping Sitter present: q 15 minute observations 

## 2019-06-27 NOTE — ED Notes (Signed)
BEHAVIORAL HEALTH ROUNDING Patient sleeping: No. Patient alert : yes Behavior appropriate: Yes.  ; If no, describe:  Nutrition and fluids offered: yes Toileting and hygiene offered: Yes  Sitter present: q15 minute observations    

## 2019-06-28 LAB — GLUCOSE, CAPILLARY: Glucose-Capillary: 118 mg/dL — ABNORMAL HIGH (ref 70–99)

## 2019-06-28 MED ORDER — ACETAMINOPHEN 325 MG PO TABS
650.0000 mg | ORAL_TABLET | Freq: Once | ORAL | Status: AC
  Administered 2019-06-28: 650 mg via ORAL
  Filled 2019-06-28: qty 2

## 2019-06-28 NOTE — ED Notes (Signed)
Pt. Awake sitting in reclining chair in room.  Pt. Had come out of room twice, pt. Redirected back to room.  Pt. Given cup of apple juice and tv turned on.

## 2019-06-28 NOTE — ED Notes (Signed)
Pt arousable for meal tray. Pt compliant with vital signs. Pt states he feels well. Pt lights turned back off so pt can continue resting. Meal tray placed in recliner.

## 2019-06-28 NOTE — ED Notes (Signed)
Pt given meal tray.

## 2019-06-28 NOTE — ED Notes (Signed)
Pt able to change self and wipe self with warm bath wipes. Pt helps with changing of sheets. Pt helps with bedside cleaning. Pt ambulatory independently.

## 2019-06-29 ENCOUNTER — Emergency Department: Payer: Medicare HMO

## 2019-06-29 DIAGNOSIS — M25561 Pain in right knee: Secondary | ICD-10-CM | POA: Diagnosis not present

## 2019-06-29 MED ORDER — ACETAMINOPHEN 325 MG PO TABS
650.0000 mg | ORAL_TABLET | Freq: Four times a day (QID) | ORAL | Status: DC | PRN
  Administered 2019-06-29 – 2019-06-30 (×2): 650 mg via ORAL
  Filled 2019-06-29: qty 2

## 2019-06-29 MED ORDER — OLANZAPINE 10 MG IM SOLR
10.0000 mg | Freq: Once | INTRAMUSCULAR | Status: DC | PRN
  Filled 2019-06-29: qty 10

## 2019-06-29 MED ORDER — ACETAMINOPHEN 325 MG PO TABS
ORAL_TABLET | ORAL | Status: AC
  Filled 2019-06-29: qty 2

## 2019-06-29 NOTE — ED Notes (Signed)
Pt continues to come out of room and wander around. Pt making threatening gestures towards officers but does not try to actually approach/harm anyone. Roselyn Reef, NP notified. See new orders.

## 2019-06-29 NOTE — ED Provider Notes (Addendum)
Its been more than 10 days since patients had his positive Covid test 10 days without any symptoms.  He is as I understand clear now of Covid.   Nena Polio, MD 06/29/19 1203 Knee films reviewed show no acute pathology   Nena Polio, MD 06/29/19 1222

## 2019-06-29 NOTE — ED Notes (Signed)
Pt out of shower and given lunch tray.

## 2019-06-29 NOTE — ED Notes (Signed)
Hourly rounding reveals patient in room. No complaints, stable, in no acute distress. Q15 minute rounds and monitoring via Rover and Officer to continue.   

## 2019-06-29 NOTE — ED Notes (Signed)
Pt given meal tray. Pt ate 100%

## 2019-06-29 NOTE — ED Notes (Signed)
Pt given an ice pack for his knee.

## 2019-06-29 NOTE — ED Notes (Signed)
Pt given meal tray.

## 2019-06-29 NOTE — ED Provider Notes (Signed)
-----------------------------------------   6:43 AM on 06/29/2019 -----------------------------------------   Blood pressure 118/72, pulse (!) 113, temperature 98.3 F (36.8 C), temperature source Oral, resp. rate 16, height 6' (1.829 m), weight 105.2 kg, SpO2 98 %.  The patient is sleeping at this time.  There have been no acute events since the last update.  Awaiting disposition plan from Behavioral Medicine and/or Social Work team(s).   Paulette Blanch, MD 06/29/19 [phone]

## 2019-06-29 NOTE — ED Notes (Signed)
Pt continues to c/o knee pain. Dr. Cinda Quest notified and going to see patient.

## 2019-06-29 NOTE — ED Notes (Signed)
Report to include Situation, Background, Assessment, and Recommendations received from Sarah RN. Patient alert and oriented, warm and dry, in no acute distress. Patient denies SI, HI, AVH and pain. Patient made aware of Q15 minute rounds and [place] and Officer presence for their safety. Patient instructed to come to me with needs or concerns.   

## 2019-06-29 NOTE — ED Notes (Signed)
Pt meets criteria to be cleared for covid. Charge RN Brandy and Dr. Cinda Quest are aware and criteria reviewed with them. Pt tested on 11/30 and it has now been 14 days. Pt can come off precautions for covid.

## 2019-06-29 NOTE — ED Notes (Signed)
Pt taking a shower. Provided with new burgundy top, blue pants, socks, and underwear.

## 2019-06-30 NOTE — ED Notes (Signed)
Patient pacing around the chair in his room. When speaking to patient has flat affect just stairs at you, but continues to follow command.

## 2019-06-30 NOTE — ED Notes (Signed)
Patient c/o of right knee pain, has been wondering into hall pointing at his knee. Patient unable to verbally express his needs. Patient walked into room and sat in his recliner and pulled up his pants showing writer his right knee, when asked if he was having pain there he responded with a thumbs up. Dr Archie Balboa made aware. PRN meds given. Will monitor.

## 2019-06-30 NOTE — ED Notes (Signed)
G

## 2019-06-30 NOTE — Care Management (Signed)
RN CM: Incoming call from Jill Side, [place] worker requesting update on placement. RNCM advised that requests had been sent out however assistance from APS would be appreciated. Ron requested FL2 be sent via secure fax to [email or web address]

## 2019-06-30 NOTE — ED Notes (Signed)
Hourly rounding reveals patient in room. No complaints, stable, in no acute distress. Q15 minute rounds and monitoring via Rover and Officer to continue.   

## 2019-06-30 NOTE — ED Notes (Signed)
Pt showered

## 2019-06-30 NOTE — ED Provider Notes (Signed)
-----------------------------------------   9:31 AM on 06/30/2019 -----------------------------------------   Blood pressure (!) 141/88, pulse 97, temperature 98.4 F (36.9 C), temperature source Oral, resp. rate 20, height 6' (1.829 m), weight 105.2 kg, SpO2 98 %.  The patient is calm and cooperative at this time.  There have been no acute events since the last update.  Awaiting disposition plan from Behavioral Medicine and/or Social Work team(s).    Nena Polio, MD 06/30/19 [phone]

## 2019-06-30 NOTE — ED Notes (Signed)
Pt given meal tray.

## 2019-06-30 NOTE — ED Notes (Signed)
Pt ambulated to the restroom.

## 2019-06-30 NOTE — ED Notes (Signed)
Pt up to restroom, pt left door open and was taking his pants off. Pt instructed that he needs to close the door when using the restroom by [place], EDT

## 2019-06-30 NOTE — ED Notes (Signed)
Vol/Pending placement  

## 2019-06-30 NOTE — ED Notes (Signed)
Assumed care of patient, patient awake and alert x 2 easily redirectable. Patient continues to come out of room and wonder in hall in his underwear. Currently back in room. Patient te 100% of his breakfast as per prior nurse. Patient took shower this morning as per prior nurse. Safety maintained. Awaiting lunch.

## 2019-06-30 NOTE — ED Notes (Signed)
Hourly rounding reveals patient in room. No complaints, stable, in no acute distress. Q15 minute rounds and monitoring via [employer] to continue. 

## 2019-06-30 NOTE — ED Notes (Addendum)
Social worker called trying to get in touch with [facility] sw, stated she was waiting for someone to call her and update her with paper work for patient transfer. Also wanted to know if wife has called to inquire any information about her husband. Social worker has [employer] social workers number and was advised to contact her for any information regarding patient transfer.

## 2019-07-01 MED ORDER — RISPERIDONE 1 MG PO TABS: 1.0000 mg | ORAL_TABLET | Freq: Every morning | ORAL | 0 refills | Status: AC

## 2019-07-01 MED ORDER — LORAZEPAM 1 MG PO TABS: 1.0000 mg | ORAL_TABLET | Freq: Four times a day (QID) | ORAL | 0 refills | Status: DC | PRN

## 2019-07-01 MED ORDER — TRAZODONE HCL 50 MG PO TABS: 50.0000 mg | ORAL_TABLET | Freq: Every day | ORAL | 0 refills | Status: AC

## 2019-07-01 MED ORDER — RISPERIDONE 2 MG PO TABS: 2.0000 mg | ORAL_TABLET | Freq: Every day | ORAL | 0 refills | Status: DC

## 2019-07-01 NOTE — Social Work (Signed)
CSW contacted APS Raymond Gurney to let her know that the patient's daughter was asked by the pt's wife to pick up the patient. APS stated that she will reach out to the pt's daughter.  No other needs.     Ardelle Anton, MSW, [facility] ([place]) Phone: [phone] Fax: [phone]

## 2019-07-01 NOTE — ED Notes (Addendum)
BEHAVIORAL HEALTH ROUNDING Patient sleeping: No. Patient alert : yes Behavior appropriate: Yes.  ; If no, describe:  Nutrition and fluids offered: yes Toileting and hygiene offered: Yes  Sitter present: q15 minute observations    

## 2019-07-01 NOTE — ED Provider Notes (Signed)
-----------------------------------------   6:44 AM on 07/01/2019 -----------------------------------------   Blood pressure (!) 143/96, pulse 99, temperature 98.6 F (37 C), temperature source Oral, resp. rate 17, height 1.829 m (6'), weight 105.2 kg, SpO2 100 %.  The patient is calm and cooperative at this time.  There have been no acute events since the last update.  Awaiting disposition plan from Behavioral Medicine and/or Social Work team(s).   Hinda Kehr, MD 07/01/19 [phone]

## 2019-07-01 NOTE — ED Notes (Signed)
Hourly rounding reveals patient in room. No complaints, stable, in no acute distress. Q15 minute rounds and monitoring via Rover and Officer to continue.   

## 2019-07-01 NOTE — ED Notes (Signed)
BEHAVIORAL HEALTH ROUNDING Patient sleeping: Yes.   Patient alert and oriented: eyes closed  Appears asleep Behavior appropriate: Yes.  ; If no, describe:  Nutrition and fluids offered: Yes  Toileting and hygiene offered: sleeping Sitter present: q 15 minute observations  Law enforcement present: yes  [employer] 

## 2019-07-01 NOTE — ED Notes (Addendum)
BEHAVIORAL HEALTH ROUNDING Patient sleeping: No. Patient alert : yes Behavior appropriate: Yes.  ; If no, describe:  Nutrition and fluids offered: yes Toileting and hygiene offered: Yes  Sitter present: q15 minute observations  Law enforcement present: Yes  [employer]

## 2019-07-01 NOTE — ED Notes (Signed)
ED  Is the patient under IVC or is there intent for IVC: Voluntary  Is the patient medically cleared: Yes.   Is there vacancy in the ED BHU: Yes.   Is the population mix appropriate for patient:  No - he has dementia    Is the patient awaiting placement in inpatient or outpatient setting: Yes.    Has the patient had a psychiatric consult: Yes.   Survey of unit performed for contraband, proper placement and condition of furniture, tampering with fixtures in bathroom, shower, and each patient room: Yes.  ; Findings:  APPEARANCE/BEHAVIOR Calm and cooperative NEURO ASSESSMENT Orientation: oriented to self   Denies pain Hallucinations: No.None noted (Hallucinations)  denies Speech: Normal  Slow to respond  Gait: normal RESPIRATORY ASSESSMENT Even  Unlabored respirations  CARDIOVASCULAR ASSESSMENT Pulses equal   regular rate  Skin warm and dry   GASTROINTESTINAL ASSESSMENT no GI complaint EXTREMITIES Full ROM  PLAN OF CARE Provide calm/safe environment. Vital signs assessed twice daily. ED BHU Assessment once each 12-hour shift.  Assure the ED provider has rounded once each shift. Provide and encourage hygiene. Provide redirection as needed. Assess for escalating behavior; address immediately and inform ED provider.  Assess family dynamic and appropriateness for visitation as needed: Yes.  ; If necessary, describe findings:  Educate the patient/family about BHU procedures/visitation: Yes.  ; If necessary, describe findings:

## 2019-07-01 NOTE — ED Notes (Signed)
Light dimmed per his request  He went and got back into bed

## 2019-07-01 NOTE — ED Provider Notes (Addendum)
Patient's wife states she feels comfortable bringing the patient home.  She states since he has been initiated on medications here, she feels he has improved and feels comfortable with him returning home and in her ability to care for him at home.  Social work discussed this with the wife as well as APS, who confirms that patient is cleared to return home.  He has been cleared from a psychiatric perspective.  His positive COVID test was on 11/30, and he is therefore now greater than 14 days asymptomatic since his testing, and cleared from a quarantine perspective.  Family plans to pick the patient up this afternoon.  We will plan for this discharge, and prepare prescriptions for the new medications initiated during his ED stay. Will advised outpatient follow up.     Lilia Pro., MD 07/01/19 [phone]

## 2019-07-01 NOTE — ED Notes (Signed)
Patient in shower 

## 2019-07-01 NOTE — Social Work (Signed)
CSW contacted pt's wife/POA, Donald Siva [phone]) to discuss placement. CSW discussed that the patient will need medicaid or pay out of pocket (~5k a month) for ALF, need medicaid for a group home, and not recommended for SNF. Pt's wife stated that she is not able to pay that and that she can take him home. Pt's wife stated that the patient has been doing much better with the medication that he is on now and understand that with his dementia it takes trying different things. Pt's wife stated that she feels comfortable with the patient going home with her. CSW stated that she will call APS and then call her back.  CSW informed pt's doctor and pt's nurse.  CSW called APS, Raymond Gurney, and explained about the payer source difficulties and explained that the patient's wife can take him home. APS stated that she is not sure that he can go home since the pt's wife stated that she cannot take care of him. APS stated that she is going to talk to her supervisor.  Pt's doctor secured chat CSW to explain that the patient was getting a bit upset.  APS called CSW back and stated that the pt's wife can take him home. She stated that she received the [facility] and that right now she cannot look for any placement or help with Medicaid with the patient but once the case is close and they assign someone else then they can help.  CSW entered Medicaid application information into the pt's AVS.  CSW called the pt's wife and explained that he can go home and that the information for medicaid will be in their discharge summary. Pt's wife stated that she can come pick up the patient at 4:30pm. She currently is driving some school buses and she gets off of work around 4:30pm.   Laurel informed the pt's doctor and nurse through secure chat. No additional needs needed.      Ardelle Anton, MSW, [facility] ([place]) Phone: [phone] Fax: [phone]

## 2019-07-01 NOTE — ED Notes (Signed)
He has awakened and has ambulated out into the hallway  -  I introduced myself to him and he smiled  He went back into his room   Light turned on for him

## 2019-07-01 NOTE — ED Notes (Signed)
BEHAVIORAL HEALTH ROUNDING Patient sleeping: Yes.   Patient alert and oriented: eyes closed  Appears asleep Behavior appropriate: Yes.  ; If no, describe:  Nutrition and fluids offered: Yes  Toileting and hygiene offered: sleeping Sitter present: q 15 minute observations  Law enforcement present: yes  [employer]   ENVIRONMENTAL ASSESSMENT Potentially harmful objects out of patient reach: Yes.   Personal belongings secured: Yes.   Patient dressed in hospital provided attire only: Yes.   Plastic bags out of patient reach: Yes.   Patient care equipment (cords, cables, call bells, lines, and drains) shortened, removed, or accounted for: Yes.   Equipment and supplies removed from bottom of stretcher: Yes.   Potentially toxic materials out of patient reach: Yes.   Sharps container removed or out of patient reach: Yes.

## 2019-07-01 NOTE — ED Notes (Signed)
Report to include Situation, Background, Assessment, and Recommendations received from Jeannette RN. Patient alert and oriented, warm and dry, in no acute distress. Patient denies SI, HI, AVH and pain. Patient made aware of Q15 minute rounds and Rover and Officer presence for their safety. Patient instructed to come to me with needs or concerns.   

## 2019-07-01 NOTE — ED Notes (Signed)
Patient observed lying in bed with eyes closed  Even, unlabored respirations observed   NAD pt appears to be sleeping  I will continue to monitor along with every 15 minute visual observations     

## 2019-07-02 DIAGNOSIS — F39 Unspecified mood [affective] disorder: Secondary | ICD-10-CM | POA: Diagnosis not present

## 2019-07-02 DIAGNOSIS — M542 Cervicalgia: Secondary | ICD-10-CM | POA: Diagnosis not present

## 2019-07-03 ENCOUNTER — Telehealth: Payer: Self-pay

## 2019-07-03 NOTE — Telephone Encounter (Signed)
pt wife called left a message that pt is in the hospital and has been in the er for 2 weeks and that they have changed all his medications and he is like a zombie.  She would like for you to call her
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
## 2019-07-09 DIAGNOSIS — F319 Bipolar disorder, unspecified: Secondary | ICD-10-CM | POA: Diagnosis not present

## 2019-07-09 DIAGNOSIS — F0280 Dementia in other diseases classified elsewhere without behavioral disturbance: Secondary | ICD-10-CM | POA: Diagnosis not present

## 2019-07-09 DIAGNOSIS — G3109 Other frontotemporal dementia: Secondary | ICD-10-CM | POA: Diagnosis not present

## 2019-07-09 DIAGNOSIS — R569 Unspecified convulsions: Secondary | ICD-10-CM | POA: Diagnosis not present

## 2019-07-09 DIAGNOSIS — Z87891 Personal history of nicotine dependence: Secondary | ICD-10-CM | POA: Diagnosis not present

## 2019-07-22 DIAGNOSIS — G3109 Other frontotemporal dementia: Secondary | ICD-10-CM | POA: Diagnosis not present

## 2019-07-22 DIAGNOSIS — F0280 Dementia in other diseases classified elsewhere without behavioral disturbance: Secondary | ICD-10-CM | POA: Diagnosis not present

## 2019-07-22 DIAGNOSIS — R4189 Other symptoms and signs involving cognitive functions and awareness: Secondary | ICD-10-CM | POA: Diagnosis not present

## 2019-07-22 DIAGNOSIS — F39 Unspecified mood [affective] disorder: Secondary | ICD-10-CM | POA: Diagnosis not present

## 2019-07-22 DIAGNOSIS — F319 Bipolar disorder, unspecified: Secondary | ICD-10-CM | POA: Diagnosis not present

## 2019-07-23 ENCOUNTER — Encounter: Payer: Self-pay | Admitting: Psychiatry

## 2019-07-23 ENCOUNTER — Ambulatory Visit (INDEPENDENT_AMBULATORY_CARE_PROVIDER_SITE_OTHER): Payer: Medicare HMO | Admitting: Psychiatry

## 2019-07-23 ENCOUNTER — Other Ambulatory Visit: Payer: Self-pay

## 2019-07-23 DIAGNOSIS — F0281 Dementia in other diseases classified elsewhere with behavioral disturbance: Secondary | ICD-10-CM

## 2019-07-23 DIAGNOSIS — F3178 Bipolar disorder, in full remission, most recent episode mixed: Secondary | ICD-10-CM | POA: Insufficient documentation

## 2019-07-23 DIAGNOSIS — F17200 Nicotine dependence, unspecified, uncomplicated: Secondary | ICD-10-CM

## 2019-07-23 DIAGNOSIS — F1021 Alcohol dependence, in remission: Secondary | ICD-10-CM

## 2019-07-23 DIAGNOSIS — F1221 Cannabis dependence, in remission: Secondary | ICD-10-CM | POA: Diagnosis not present

## 2019-07-23 DIAGNOSIS — F02B18 Dementia in other diseases classified elsewhere, moderate, with other behavioral disturbance: Secondary | ICD-10-CM

## 2019-07-23 MED ORDER — LORAZEPAM 1 MG PO TABS: 0.5000 mg | ORAL_TABLET | Freq: Two times a day (BID) | ORAL | 0 refills | Status: AC | PRN
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
## 2019-08-02 DIAGNOSIS — Z743 Need for continuous supervision: Secondary | ICD-10-CM | POA: Diagnosis not present

## 2019-08-02 DIAGNOSIS — R4182 Altered mental status, unspecified: Secondary | ICD-10-CM | POA: Diagnosis not present

## 2019-08-02 DIAGNOSIS — M2550 Pain in unspecified joint: Secondary | ICD-10-CM | POA: Diagnosis not present

## 2019-08-02 DIAGNOSIS — Z7401 Bed confinement status: Secondary | ICD-10-CM | POA: Diagnosis not present

## 2019-08-02 DIAGNOSIS — R4020 Unspecified coma: Secondary | ICD-10-CM | POA: Diagnosis not present

## 2019-08-06 ENCOUNTER — Telehealth: Payer: Self-pay | Admitting: Psychiatry

## 2019-08-06 NOTE — Telephone Encounter (Signed)
Spoke to wife, offered support since patient passed away yesterday.

## 2019-08-18 DEATH — deceased

## 2020-01-03 IMAGING — CR DG CHEST 2V
1 series · 2 of 2 positions shown · non-contrast
Comparison: 07/14/2018

CLINICAL DATA: Chest pain

EXAM:
CHEST - 2 VIEW

[Series 1: dg chest 2 view · 0.14mm/px · 2 of 2 slices shown]
[im 1/2]
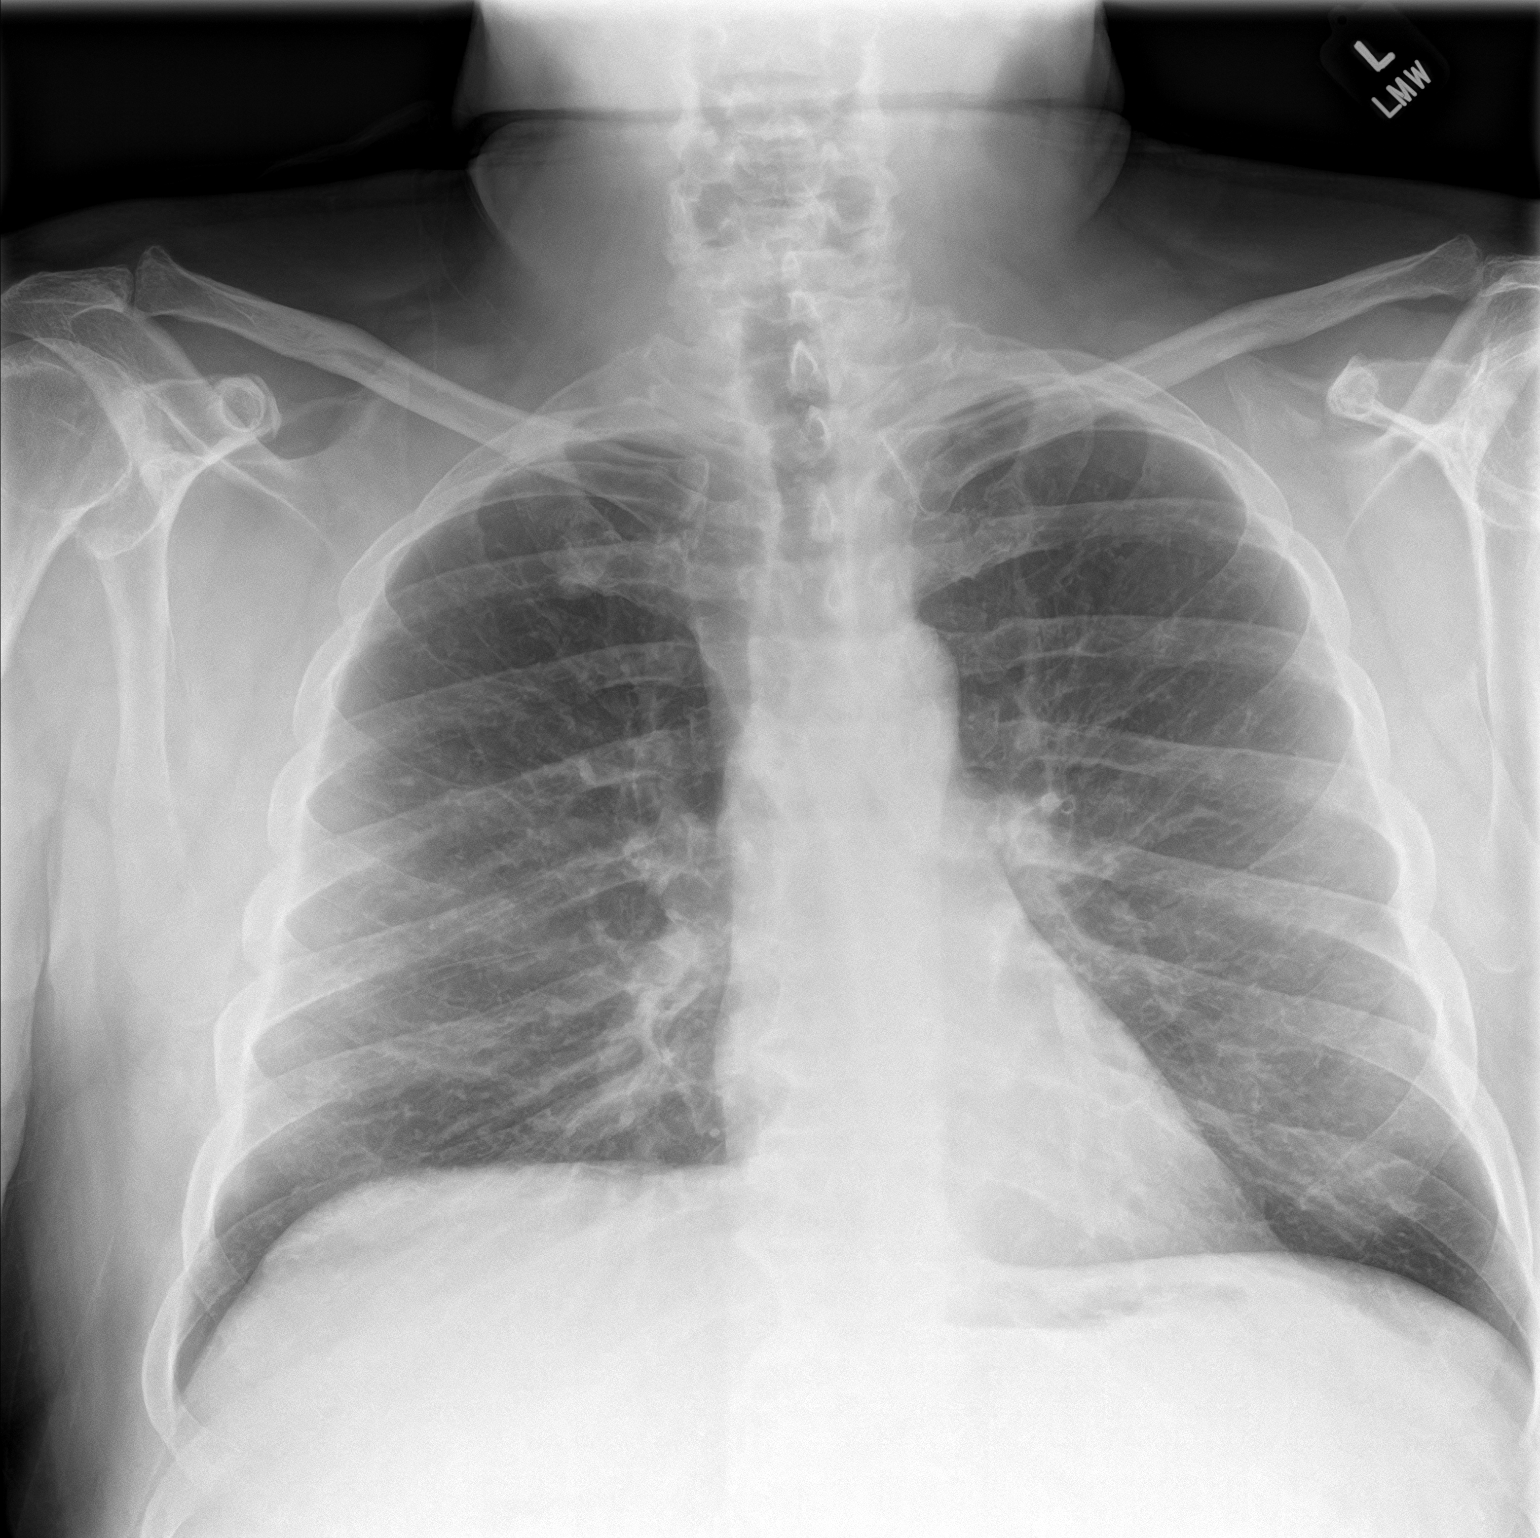
[im 2/2]
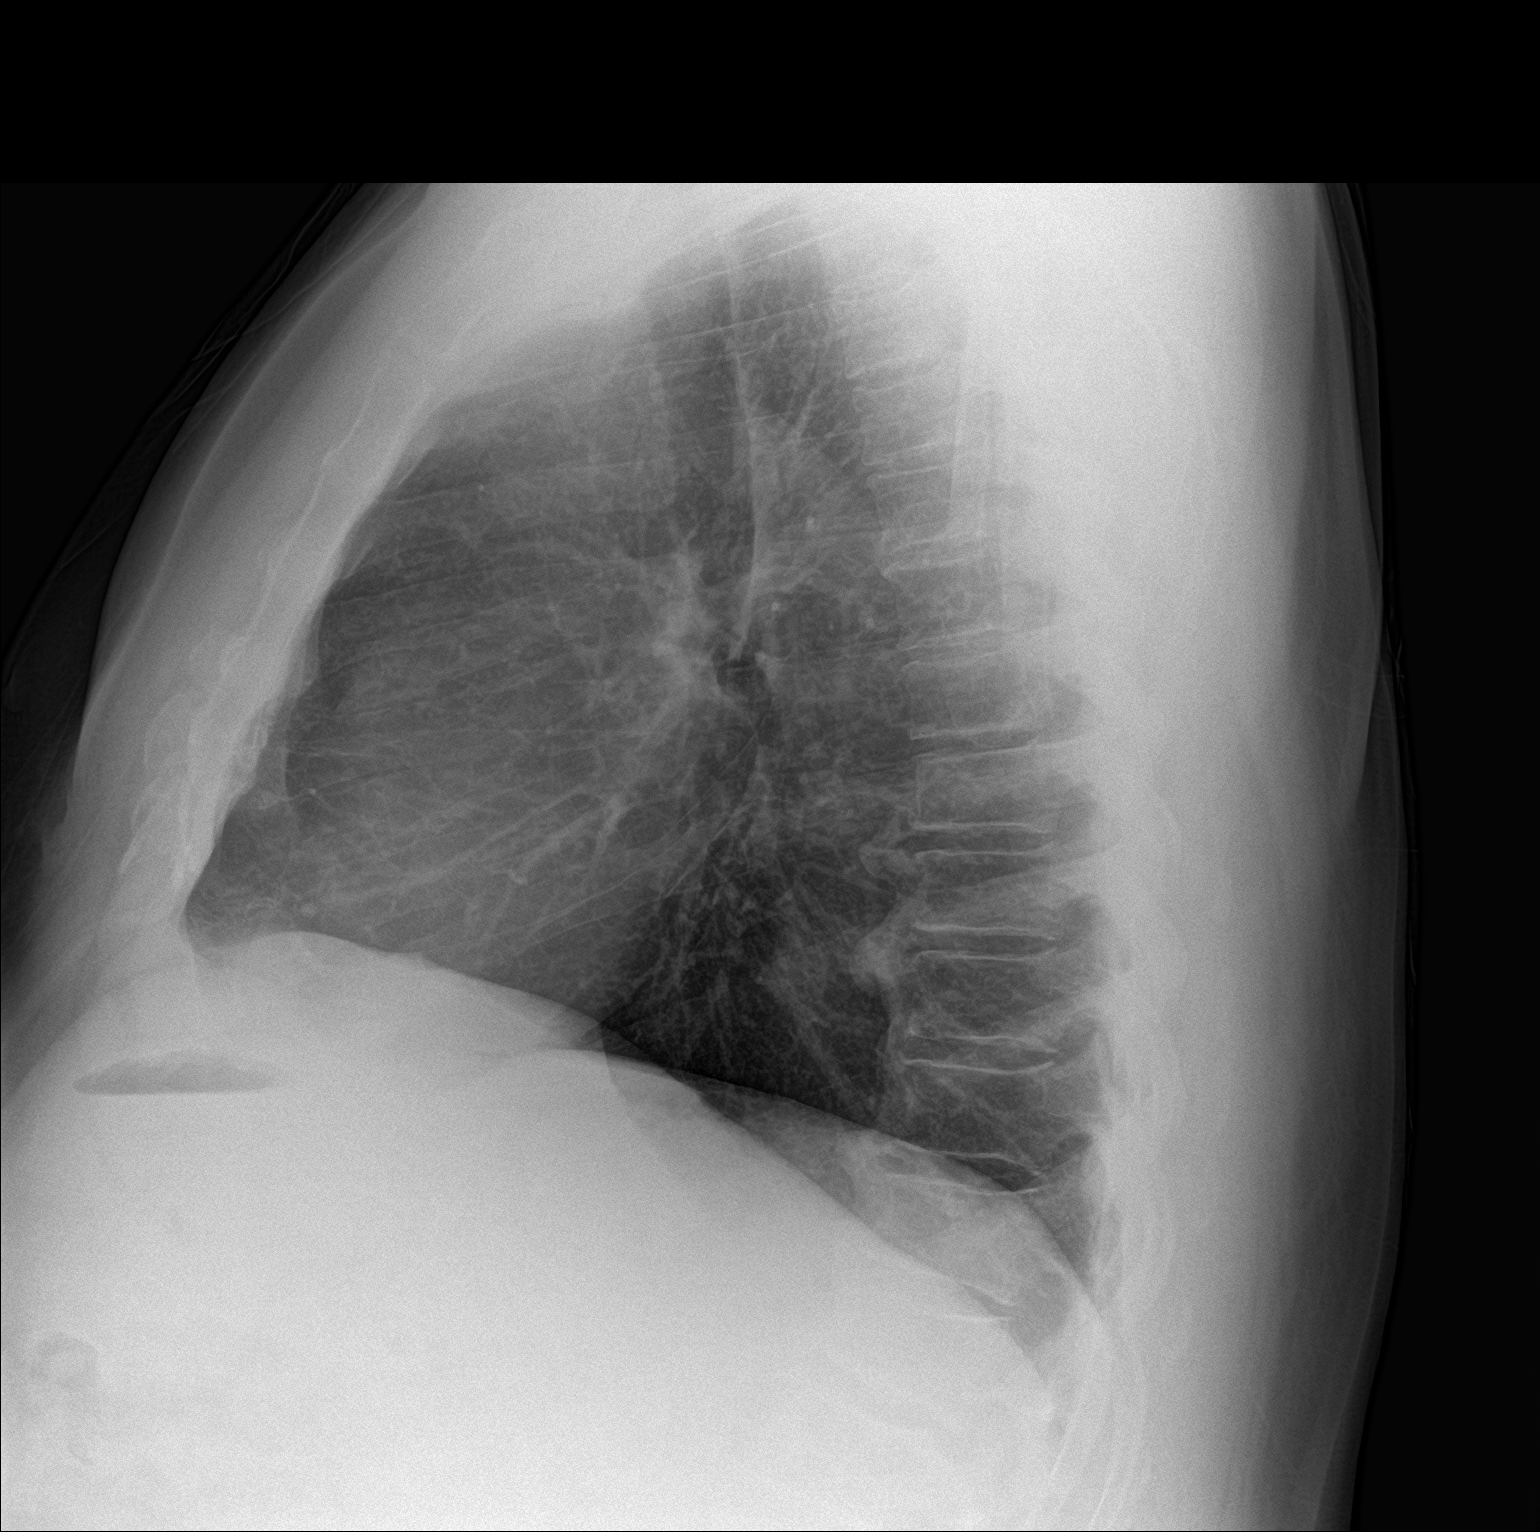

[2 of 2 positions shown; findings below may reference images not displayed]

FINDINGS: The heart size and mediastinal contours are within normal limits.
Both lungs are clear. The visualized skeletal structures are
unremarkable.
IMPRESSION: No active cardiopulmonary disease.
